# Patient Record
Sex: Female | Born: 1995
Health system: Southern US, Community
[De-identification: ages and names within clinical notes are randomized; demographics above are authoritative.]

## PROBLEM LIST (undated history)

## (undated) DIAGNOSIS — N189 Chronic kidney disease, unspecified: Secondary | ICD-10-CM

## (undated) DIAGNOSIS — R51 Headache: Secondary | ICD-10-CM

## (undated) DIAGNOSIS — T7840XA Allergy, unspecified, initial encounter: Secondary | ICD-10-CM

## (undated) DIAGNOSIS — E559 Vitamin D deficiency, unspecified: Secondary | ICD-10-CM

## (undated) DIAGNOSIS — E079 Disorder of thyroid, unspecified: Secondary | ICD-10-CM

## (undated) DIAGNOSIS — M7989 Other specified soft tissue disorders: Secondary | ICD-10-CM

## (undated) DIAGNOSIS — G4730 Sleep apnea, unspecified: Secondary | ICD-10-CM

## (undated) DIAGNOSIS — R011 Cardiac murmur, unspecified: Secondary | ICD-10-CM

## (undated) DIAGNOSIS — F419 Anxiety disorder, unspecified: Secondary | ICD-10-CM

## (undated) DIAGNOSIS — D649 Anemia, unspecified: Secondary | ICD-10-CM

## (undated) DIAGNOSIS — E039 Hypothyroidism, unspecified: Secondary | ICD-10-CM

## (undated) DIAGNOSIS — K219 Gastro-esophageal reflux disease without esophagitis: Secondary | ICD-10-CM

## (undated) DIAGNOSIS — N289 Disorder of kidney and ureter, unspecified: Secondary | ICD-10-CM

## (undated) DIAGNOSIS — R1310 Dysphagia, unspecified: Secondary | ICD-10-CM

## (undated) DIAGNOSIS — K760 Fatty (change of) liver, not elsewhere classified: Secondary | ICD-10-CM

## (undated) HISTORY — DX: Other specified soft tissue disorders: M79.89

## (undated) HISTORY — DX: Chronic kidney disease, unspecified: N18.9

## (undated) HISTORY — DX: Vitamin D deficiency, unspecified: E55.9

## (undated) HISTORY — DX: Gastro-esophageal reflux disease without esophagitis: K21.9

## (undated) HISTORY — PX: TOTAL THYROIDECTOMY: SHX2547

## (undated) HISTORY — DX: Anxiety disorder, unspecified: F41.9

## (undated) HISTORY — DX: Allergy, unspecified, initial encounter: T78.40XA

## (undated) HISTORY — DX: Disorder of kidney and ureter, unspecified: N28.9

## (undated) HISTORY — DX: Cardiac murmur, unspecified: R01.1

## (undated) HISTORY — DX: Headache: R51

## (undated) HISTORY — DX: Dysphagia, unspecified: R13.10

## (undated) HISTORY — DX: Disorder of thyroid, unspecified: E07.9

## (undated) HISTORY — DX: Fatty (change of) liver, not elsewhere classified: K76.0

---
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
## 2014-01-21 ENCOUNTER — Ambulatory Visit (INDEPENDENT_AMBULATORY_CARE_PROVIDER_SITE_OTHER): Payer: Medicaid Other | Admitting: Family

## 2014-01-21 ENCOUNTER — Encounter (INDEPENDENT_AMBULATORY_CARE_PROVIDER_SITE_OTHER): Payer: Self-pay

## 2014-01-21 ENCOUNTER — Encounter: Payer: Self-pay | Admitting: Family

## 2014-01-21 VITALS — BP 114/67 | HR 61 | Temp 97.7°F | Ht 67.0 in | Wt 264.4 lb

## 2014-01-21 DIAGNOSIS — Z713 Dietary counseling and surveillance: Secondary | ICD-10-CM | POA: Diagnosis not present

## 2014-01-21 DIAGNOSIS — E669 Obesity, unspecified: Secondary | ICD-10-CM | POA: Diagnosis not present

## 2014-01-21 DIAGNOSIS — K219 Gastro-esophageal reflux disease without esophagitis: Secondary | ICD-10-CM | POA: Diagnosis not present

## 2014-01-21 DIAGNOSIS — Z23 Encounter for immunization: Secondary | ICD-10-CM | POA: Diagnosis not present

## 2014-01-21 MED ORDER — OMEPRAZOLE 20 MG PO CPDR: 20.0000 mg | DELAYED_RELEASE_CAPSULE | Freq: Every day | ORAL | Status: DC

## 2014-01-21 MED ORDER — NALTREXONE-BUPROPION HCL ER 8-90 MG PO TB12: ORAL_TABLET | ORAL | Status: DC

## 2014-01-21 NOTE — Progress Notes (Signed)
Subjective:    Patient ID: Janet [name], female    DOB: [birth year], 19 y.o.   MRN: [id number]  Gastrophageal Reflux She complains of belching, chest pain, coughing, heartburn and nausea. She reports no choking or no sore throat. This is a new problem. The current episode started more than 1 year ago. The problem occurs frequently. The heartburn duration is more than one hour. The heartburn is located in the substernum. The heartburn is of severe intensity. The heartburn wakes her from sleep. The symptoms are aggravated by certain foods, ETOH, exertion and lying down. Risk factors include obesity. She has tried an antacid for the symptoms. The treatment provided mild relief. Past procedures do not include esophageal manometry or H. pylori antibody titer. Past invasive treatments do not include reflux surgery.      Review of Systems  Constitutional: Negative.   HENT: Negative.  Negative for sore throat.   Eyes: Negative.   Respiratory: Positive for cough. Negative for choking and shortness of breath.   Cardiovascular: Positive for chest pain. Negative for palpitations.  Gastrointestinal: Positive for heartburn and nausea.  Endocrine: Negative.   Genitourinary: Negative.   Musculoskeletal: Negative.   Neurological: Negative.  Negative for headaches.  Hematological: Negative.   Psychiatric/Behavioral: Negative.   All other systems reviewed and are negative.      Objective:   Physical Exam  Constitutional: She is oriented to person, place, and time. She appears well-developed and well-nourished. No distress.  HENT:  Head: Normocephalic and atraumatic.  Right Ear: External ear normal.  Left Ear: External ear normal.  Nose: Nose normal.  Mouth/Throat: Oropharynx is clear and moist.  Eyes: Pupils are equal, round, and reactive to light.  Neck: Normal range of motion. Neck supple. No thyromegaly present.  Cardiovascular: Normal rate, regular rhythm, normal heart sounds and intact  distal pulses.   No murmur heard. Pulmonary/Chest: Effort normal and breath sounds normal. No respiratory distress. She has no wheezes.  Abdominal: Soft. Bowel sounds are normal. She exhibits no distension. There is no tenderness.  Musculoskeletal: Normal range of motion. She exhibits no edema or tenderness.  Neurological: She is alert and oriented to person, place, and time. She has normal reflexes. No cranial nerve deficit.  Skin: Skin is warm and dry.  Psychiatric: She has a normal mood and affect. Her behavior is normal. Judgment and thought content normal.  Vitals reviewed.   BP 114/67 mmHg  Pulse 61  Temp(Src) 97.7 F (36.5 C) (Oral)  Ht 5' 7" (1.702 m)  Wt 264 lb 6.4 oz (119.931 kg)  BMI 41.40 kg/m2       Assessment & Plan:  1. Gastroesophageal reflux disease, esophagitis presence not specified - omeprazole (PRILOSEC) 20 MG capsule; Take 1 capsule (20 mg total) by mouth daily.  Dispense: 90 capsule; Refill: 3 - CMP14+EGFR  2. Obesity - Naltrexone-Bupropion HCl ER 8-90 MG TB12; 1 tab PO q AM for one week, then 1 tab PO BID for 1 week, then 2 tabs PO q AM and 1 PO aPM for 1 week. Max 4 tabs/day  Dispense: 120 tablet; Refill: 0 - CMP14+EGFR  3. Weight loss counseling, encounter for -Will see pt in one month for weight loss -Medication d/c in 12 weeks if <5% weight loss -Discussed healthy diet and exercise - Naltrexone-Bupropion HCl ER 8-90 MG TB12; 1 tab PO q AM for one week, then 1 tab PO BID for 1 week, then 2 tabs PO q AM and 1 PO aPM for  1 week. Max 4 tabs/day  Dispense: 120 tablet; Refill: 0 - CMP14+EGFR   Continue all meds Labs pending Health Maintenance reviewed-Flu vaccine given today Diet and exercise encouraged RTO 1  Month for weight loss counseling   Evelina Dun, FNP

## 2014-01-21 NOTE — Patient Instructions (Signed)
\Exercise to Lose Weight Exercise and a healthy diet may help you lose weight. Your doctor may suggest specific exercises. EXERCISE IDEAS AND TIPS  Choose low-cost things you enjoy doing, such as walking, bicycling, or exercising to workout videos.  Take stairs instead of the elevator.  Walk during your lunch break.  Park your car further away from work or school.  Go to a gym or an exercise class.  Start with 5 to 10 minutes of exercise each day. Build up to 30 minutes of exercise 4 to 6 days a week.  Wear shoes with good support and comfortable clothes.  Stretch before and after working out.  Work out until you breathe harder and your heart beats faster.  Drink extra water when you exercise.  Do not do so much that you hurt yourself, feel dizzy, or get very short of breath. Exercises that burn about 150 calories:  Running 1  miles in 15 minutes.  Playing volleyball for 45 to 60 minutes.  Washing and waxing a car for 45 to 60 minutes.  Playing touch football for 45 minutes.  Walking 1  miles in 35 minutes.  Pushing a stroller 1  miles in 30 minutes.  Playing basketball for 30 minutes.  Raking leaves for 30 minutes.  Bicycling 5 miles in 30 minutes.  Walking 2 miles in 30 minutes.  Dancing for 30 minutes.  Shoveling snow for 15 minutes.  Swimming laps for 20 minutes.  Walking up stairs for 15 minutes.  Bicycling 4 miles in 15 minutes.  Gardening for 30 to 45 minutes.  Jumping rope for 15 minutes.  Washing windows or floors for 45 to 60 minutes. Document Released: 01/26/2010 Document Revised: 03/18/2011 Document Reviewed: 01/26/2010 [facility] Patient Information 2015 [place], [place]. This information is not intended to replace advice given to you by your health care provider. Make sure you discuss any questions you have with your health care provider. Gastroesophageal Reflux Disease, Adult Gastroesophageal reflux disease (GERD) happens when acid from  your stomach flows up into the esophagus. When acid comes in contact with the esophagus, the acid causes soreness (inflammation) in the esophagus. Over time, GERD may create small holes (ulcers) in the lining of the esophagus. CAUSES   Increased body weight. This puts pressure on the stomach, making acid rise from the stomach into the esophagus.  Smoking. This increases acid production in the stomach.  Drinking alcohol. This causes decreased pressure in the lower esophageal sphincter (valve or ring of muscle between the esophagus and stomach), allowing acid from the stomach into the esophagus.  Late evening meals and a full stomach. This increases pressure and acid production in the stomach.  A malformed lower esophageal sphincter. Sometimes, no cause is found. SYMPTOMS   Burning pain in the lower part of the mid-chest behind the breastbone and in the mid-stomach area. This may occur twice a week or more often.  Trouble swallowing.  Sore throat.  Dry cough.  Asthma-like symptoms including chest tightness, shortness of breath, or wheezing. DIAGNOSIS  Your caregiver may be able to diagnose GERD based on your symptoms. In some cases, X-rays and other tests may be done to check for complications or to check the condition of your stomach and esophagus. TREATMENT  Your caregiver may recommend over-the-counter or prescription medicines to help decrease acid production. Ask your caregiver before starting or adding any new medicines.  HOME CARE INSTRUCTIONS   Change the factors that you can control. Ask your caregiver for guidance concerning  weight loss, quitting smoking, and alcohol consumption.  Avoid foods and drinks that make your symptoms worse, such as:  Caffeine or alcoholic drinks.  Chocolate.  Peppermint or mint flavorings.  Garlic and onions.  Spicy foods.  Citrus fruits, such as oranges, lemons, or limes.  Tomato-based foods such as sauce, chili, salsa, and  pizza.  Fried and fatty foods.  Avoid lying down for the 3 hours prior to your bedtime or prior to taking a nap.  Eat small, frequent meals instead of large meals.  Wear loose-fitting clothing. Do not wear anything tight around your waist that causes pressure on your stomach.  Raise the head of your bed 6 to 8 inches with wood blocks to help you sleep. Extra pillows will not help.  Only take over-the-counter or prescription medicines for pain, discomfort, or fever as directed by your caregiver.  Do not take aspirin, ibuprofen, or other nonsteroidal anti-inflammatory drugs (NSAIDs). SEEK IMMEDIATE MEDICAL CARE IF:   You have pain in your arms, neck, jaw, teeth, or back.  Your pain increases or changes in intensity or duration.  You develop nausea, vomiting, or sweating (diaphoresis).  You develop shortness of breath, or you faint.  Your vomit is green, yellow, black, or looks like coffee grounds or blood.  Your stool is red, bloody, or black. These symptoms could be signs of other problems, such as heart disease, gastric bleeding, or esophageal bleeding. MAKE SURE YOU:   Understand these instructions.  Will watch your condition.  Will get help right away if you are not doing well or get worse. Document Released: 10/03/2004 Document Revised: 03/18/2011 Document Reviewed: 07/13/2010 [facility] Patient Information 2015 [place]. This information is not intended to replace advice given to you by your health care provider. Make sure you discuss any questions you have with your health care provider.

## 2014-01-22 LAB — CMP14+EGFR
ALT: 22 IU/L (ref 0–32)
AST: 14 IU/L (ref 0–40)
Albumin/Globulin Ratio: 1.4 (ref 1.1–2.5)
Albumin: 4.3 g/dL (ref 3.5–5.5)
Alkaline Phosphatase: 87 IU/L (ref 39–117)
BUN/Creatinine Ratio: 17 (ref 8–20)
BUN: 13 mg/dL (ref 6–20)
CO2: 22 mmol/L (ref 18–29)
Calcium: 9.2 mg/dL (ref 8.7–10.2)
Chloride: 102 mmol/L (ref 97–108)
Creatinine, Ser: 0.77 mg/dL (ref 0.57–1.00)
GFR calc Af Amer: 129 mL/min/{1.73_m2} (ref >59)
GFR calc non Af Amer: 112 mL/min/{1.73_m2} (ref >59)
Globulin, Total: 3 g/dL (ref 1.5–4.5)
Glucose: 79 mg/dL (ref 65–99)
Potassium: 4.6 mmol/L (ref 3.5–5.2)
Sodium: 141 mmol/L (ref 134–144)
Total Bilirubin: 0.5 mg/dL (ref 0.0–1.2)
Total Protein: 7.3 g/dL (ref 6.0–8.5)

## 2014-02-21 ENCOUNTER — Ambulatory Visit: Payer: Medicaid Other | Admitting: Family

## 2014-05-09 ENCOUNTER — Other Ambulatory Visit (INDEPENDENT_AMBULATORY_CARE_PROVIDER_SITE_OTHER): Payer: Medicaid Other

## 2014-05-09 DIAGNOSIS — E063 Autoimmune thyroiditis: Secondary | ICD-10-CM

## 2014-05-09 LAB — T4, FREE: Free T4: 0.68 ng/dL (ref 0.60–1.60)

## 2014-05-09 LAB — TSH: TSH: 6.99 u[IU]/mL — ABNORMAL HIGH (ref 0.40–5.00)

## 2014-05-11 ENCOUNTER — Other Ambulatory Visit: Payer: Self-pay | Admitting: *Deleted

## 2014-05-11 DIAGNOSIS — E063 Autoimmune thyroiditis: Secondary | ICD-10-CM

## 2014-06-27 ENCOUNTER — Other Ambulatory Visit: Payer: Medicaid Other

## 2014-06-27 ENCOUNTER — Other Ambulatory Visit (INDEPENDENT_AMBULATORY_CARE_PROVIDER_SITE_OTHER): Payer: Medicaid Other

## 2014-06-27 DIAGNOSIS — E063 Autoimmune thyroiditis: Secondary | ICD-10-CM

## 2014-06-27 LAB — TSH: TSH: 3.52 u[IU]/mL (ref 0.35–5.50)

## 2014-06-27 LAB — T4, FREE: Free T4: 0.68 ng/dL (ref 0.60–1.60)

## 2014-09-08 ENCOUNTER — Other Ambulatory Visit: Payer: Self-pay | Admitting: Pediatrics

## 2014-11-25 ENCOUNTER — Ambulatory Visit (INDEPENDENT_AMBULATORY_CARE_PROVIDER_SITE_OTHER): Payer: Medicaid Other | Admitting: Internal Medicine

## 2014-11-25 ENCOUNTER — Encounter: Payer: Self-pay | Admitting: Internal Medicine

## 2014-11-25 VITALS — BP 122/70 | HR 68 | Temp 97.9°F | Ht 67.0 in | Wt 258.0 lb

## 2014-11-25 DIAGNOSIS — E063 Autoimmune thyroiditis: Secondary | ICD-10-CM

## 2014-11-25 DIAGNOSIS — Z23 Encounter for immunization: Secondary | ICD-10-CM | POA: Diagnosis not present

## 2014-11-25 LAB — T4, FREE: Free T4: 0.64 ng/dL (ref 0.60–1.60)

## 2014-11-25 LAB — TSH: TSH: 3.92 u[IU]/mL (ref 0.35–5.50)

## 2014-11-25 MED ORDER — LEVOTHYROXINE SODIUM 75 MCG PO TABS: 75.0000 ug | ORAL_TABLET | Freq: Every day | ORAL | Status: DC

## 2014-11-25 NOTE — Patient Instructions (Signed)
Please stop at the lab.  Please have thyroid labs checked in 6 months.  Please return in 1 year.

## 2014-11-25 NOTE — Progress Notes (Signed)
Patient ID: Janet [name], female   DOB: [birth year], 19 y.o.   MRN: [id number]   HPI  Janet [name] is a 19 y.o.-year-old female, initially referred by her PCP, Dr. Christell Constant, returning for f/u for hypothyroidism 2/2 Hashimoto thyroiditis and goiter. Last visit 1 year ago.  We started Levothyroxine 25 mcg >> dose increased to 50 mcg daily in 05/2014.  She takes the LT4: - am - fasting - with water or ginger ale - + PPI  - no Fe, Ca, MVI  I reviewed pt's thyroid tests: Lab Results  Component Value Date   TSH 3.52 06/27/2014   TSH 6.99* 05/09/2014   TSH 3.964 11/25/2013   TSH 2.47 04/26/2013   TSH 5.19 02/26/2013   TSH 6.850* 01/15/2013   FREET4 0.68 06/27/2014   FREET4 0.68 05/09/2014   FREET4 0.78* 11/25/2013   FREET4 0.59* 04/26/2013   FREET4 0.57* 02/26/2013   Component     Latest Ref Rng 02/26/2013  Thyroid Peroxidase Antibody     <35.0 IU/mL 5991.0 (H)   Pt denies feeling nodules in neck, no hoarseness , improved dysphagia/no odynophagia in upper chest, no SOB with lying down.  Pt describes: - + hair loss - + heat and cold intolerance - + weight loss + gain - resolved fatigue  - improved constipation - improved dry skin all  - no anxiety/no depression  She also has a history of migraines.  ROS: Constitutional: no weight gain/loss, no fatigue, no subjective hyperthermia/hypothermia, + poor sleep Eyes: no blurry vision, no xerophthalmia ENT: no sore throat, no nodules palpated in throat, + dysphagia/no odynophagia, no  hoarseness Cardiovascular: no CP/SOB/no palpitations/no leg swelling Respiratory: no cough/SOB Gastrointestinal: no N/V/D/C, + acid reflux Musculoskeletal: no muscle pains/no joint aches Skin: no rashes, no easy bruising, + hair loss Neurological: no tremors/numbness/tingling/dizziness, + HA  I reviewed pt's medications, allergies, PMH, social hx, family hx, and changes were documented in the history of present illness. Otherwise, unchanged from my  initial visit note  PE: BP 122/70 mmHg  Pulse 68  Temp(Src) 97.9 F (36.6 C) (Oral)  Ht  (1.702 m)  Wt 258 lb (117.028 kg)  BMI 40.40 kg/m2 Wt Readings from Last 3 Encounters:  11/25/14 258 lb (117.028 kg)  01/21/14 264 lb 6.4 oz (119.931 kg) (100 %*, Z = 2.62)  11/25/13 258 lb (117.028 kg) (99 %*, Z = 2.56)   * Growth percentiles are based on CDC 2-20 Years data.   Constitutional: obese, in NAD Eyes: PERRLA, EOMI, no exophthalmos ENT: moist mucous membranes, + thyromegaly R>L, no cervical lymphadenopathy Cardiovascular: RRR, No MRG Respiratory: CTA B Gastrointestinal: abdomen soft, NT, ND, BS+ Musculoskeletal: no deformities, strength intact in all 4 Skin: moist, warm, no rashes Neurological: mild tremor with outstretched hands, DTR normal in all 4  ASSESSMENT: 1. Low TSH  2. goiter - thyroid U/S (01/31/2013): no nodules; enlarged thyroid  Right thyroid lobe Measurements: 6.9 x 1.6 x 2.7 cm. Heterogeneous parenchyma, without discrete thyroid nodule. Left thyroid lobe Measurements: 5.6 x 1.8 x 2.4 cm. Heterogeneous parenchyma, without discrete thyroid nodule. Isthmus Thickness: 8 mm in thickness. No nodules visualized. Lymphadenopathy None visualized.  PLAN:  1. Patient with Hashimoto's thyroiditis - on Levothyroxine 50 mcg daily. She takes it correctly. - will check thyroid tests today: TSH, free T4 - If these are abnormal, she will need to return in 6-8 weeks for repeat labs - If these are normal, will repeat them in 6 months - RTC in 1  year  2. Goiter - pt with neck compression sxs, but likely 2/2 GERD - reviewed together thyroid U/S 01/2013: goiter, no nodules  Given flu shot today.  Office Visit on 11/25/2014  Component Date Value Ref Range Status  . Free T4 11/25/2014 0.64  0.60 - 1.60 ng/dL Final  . TSH 40/98/119111/18/2016 3.92  0.35 - 5.50 uIU/mL Final   TSH normal, but high in the normal range >> will try to increase the dose to 75 mcg to see if her sxs  improve. Repeat labs in 5-6 weeks.

## 2014-12-07 ENCOUNTER — Encounter: Payer: Self-pay | Admitting: Pediatrics

## 2014-12-07 ENCOUNTER — Ambulatory Visit (INDEPENDENT_AMBULATORY_CARE_PROVIDER_SITE_OTHER): Payer: Medicaid Other | Admitting: Pediatrics

## 2014-12-07 VITALS — BP 115/68 | HR 64 | Temp 97.3°F | Ht 67.0 in | Wt 257.6 lb

## 2014-12-07 DIAGNOSIS — J019 Acute sinusitis, unspecified: Secondary | ICD-10-CM

## 2014-12-07 DIAGNOSIS — Z309 Encounter for contraceptive management, unspecified: Secondary | ICD-10-CM

## 2014-12-07 DIAGNOSIS — Z720 Tobacco use: Secondary | ICD-10-CM

## 2014-12-07 MED ORDER — AMOXICILLIN-POT CLAVULANATE 875-125 MG PO TABS: 1.0000 | ORAL_TABLET | Freq: Two times a day (BID) | ORAL | Status: DC

## 2014-12-07 NOTE — Patient Instructions (Signed)
Debrox drops for ear wax  Neti pot with distilled water and salt packet twice a day

## 2014-12-07 NOTE — Progress Notes (Signed)
Subjective:    Patient ID: Janet [name], female    DOB: 02/13/1995, 19 y.o.   MRN: [id number]  CC: Cough and Nasal Congestion   HPI: Janet [name] is a 19 y.o. female presenting for Cough and Nasal Congestion  Congestion started a week ago Coughing some Not keeping her awake No fevers Body aches at the beginnning Everybody has been sick around her, works at [employer]  Mood has been fine Thinks she can quit   Depression screen [facility] 2/9 12/07/2014 01/21/2014  Decreased Interest 0 0  Down, Depressed, Hopeless 0 0  PHQ - 2 Score 0 0     Relevant past medical, surgical, family and social history reviewed and updated as indicated. Interim medical history since our last visit reviewed. Allergies and medications reviewed and updated.    ROS: Per HPI unless specifically indicated above  Past Medical History Patient Active Problem List   Diagnosis Date Noted  . Tobacco abuse 12/07/2014  . GERD (gastroesophageal reflux disease) 01/21/2014  . Hashimoto's thyroiditis 01/26/2013  . Migraine without aura, without mention of intractable migraine without mention of status migrainosus 01/15/2013  . Episodic tension type headache 01/15/2013  . Obesity 01/15/2013  . Insomnia, unspecified 01/15/2013  . Dysfunctions associated with sleep stages or arousal from sleep 01/15/2013    Current Outpatient Prescriptions  Medication Sig Dispense Refill  . cloNIDine (CATAPRES) 0.1 MG tablet TAKE 1 TABLET (0.1 MG TOTAL) BY MOUTH AT BEDTIME. 30 tablet 0  . levothyroxine (SYNTHROID, LEVOTHROID) 75 MCG tablet Take 1 tablet (75 mcg total) by mouth daily. 90 tablet 3  . omeprazole (PRILOSEC) 20 MG capsule Take 1 capsule (20 mg total) by mouth daily. 90 capsule 3  . ondansetron (ZOFRAN-ODT) 4 MG disintegrating tablet Take one tablet by mouth as needed for nausea from headaches 20 tablet 5  . topiramate (TOPAMAX) 25 MG tablet TAKE 2 TABLETS BY MOUTH AT BEDTIME. 60 tablet 0  . amoxicillin-clavulanate  (AUGMENTIN) 875-125 MG tablet Take 1 tablet by mouth 2 (two) times daily. 20 tablet 0  . Naltrexone-Bupropion HCl ER 8-90 MG TB12 1 tab PO q AM for one week, then 1 tab PO BID for 1 week, then 2 tabs PO q AM and 1 PO aPM for 1 week. Max 4 tabs/day 120 tablet 0   No current facility-administered medications for this visit.       Objective:    BP 115/68 mmHg  Pulse 64  Temp(Src) 97.3 F (36.3 C) (Oral)  Ht 5\' 7"  (1.702 m)  Wt 257 lb 9.6 oz (116.847 kg)  BMI 40.34 kg/m2  Wt Readings from Last 3 Encounters:  12/07/14 257 lb 9.6 oz (116.847 kg)  11/25/14 258 lb (117.028 kg)  01/21/14 264 lb 6.4 oz (119.931 kg) (100 %*, Z = 2.62)   * Growth percentiles are based on CDC 2-20 Years data.     Gen: NAD, alert, cooperative with exam, NCAT EYES: EOMI, no scleral injection or icterus ENT:  TMs pearly gray b/l, OP with erythema, tenderness over max sinuses b/l LYMPH: no cervical LAD CV: NRRR, normal S1/S2, no murmur, distal pulses 2+ b/l Resp: CTABL, no wheezes, normal WOB Abd: +BS, soft, NTND. no guarding or organomegaly Ext: No edema, warm Neuro: Alert and oriented, strength equal b/l UE and LE, coordination grossly normal MSK: normal muscle bulk     Assessment & Plan:    Janet [name] was seen today for cough and nasal congestion ongoing over a week.  Diagnoses and all  orders for this visit:  Acute sinusitis, recurrence not specified, unspecified location -     amoxicillin-clavulanate (AUGMENTIN) 875-125 MG tablet; Take 1 tablet by mouth 2 (two) times daily.  Tobacco abuse More than 3 minutes spent discussing health risks of continued tobacco use and ways to decrease use.  Encounter for contraceptive management, unspecified encounter Pt interested in IUD vs nexplanon placement.  -     Ambulatory referral to Gynecology  Follow up plan: Return if symptoms worsen or fail to improve.  Rex Kras, MD [facility] 12/07/2014, 11:54 AM

## 2014-12-22 ENCOUNTER — Encounter: Payer: Medicaid Other | Admitting: Women's Health

## 2014-12-28 ENCOUNTER — Encounter: Payer: Self-pay | Admitting: Women's Health

## 2014-12-28 ENCOUNTER — Ambulatory Visit (INDEPENDENT_AMBULATORY_CARE_PROVIDER_SITE_OTHER): Payer: Medicaid Other | Admitting: Women's Health

## 2014-12-28 VITALS — BP 110/60 | HR 76 | Ht 66.5 in | Wt 260.5 lb

## 2014-12-28 DIAGNOSIS — N946 Dysmenorrhea, unspecified: Secondary | ICD-10-CM | POA: Diagnosis not present

## 2014-12-28 MED ORDER — MEFENAMIC ACID 250 MG PO CAPS: ORAL_CAPSULE | ORAL | Status: DC

## 2014-12-28 NOTE — Patient Instructions (Signed)
To follow-up with headache doctor to see if you have migraines with aura

## 2014-12-28 NOTE — Progress Notes (Signed)
Patient ID: Candiss NorseMegan V Hucker, female   DOB: 12/19/1995, 19 y.o.   MRN: [id number]   [facility]  Patient name: Candiss NorseMegan V Polhemus MRN [id number]  Date of birth: 02/04/1995  CC & HPI:  Candiss NorseMegan V Dolloff is a 19 y.o. G0 Caucasian female presenting today for report of painful cramps w/ periods. Periods are regular, last 3-5 days, normal flow, bad cramps for first 2-3 days that keep her up at night. Has tried ibuprofen and midol which only last 2-3 hours then cramps return even worse. States she has been told by PCP she is not a candidate for [facility], although she is unsure of why. Does have migraines and sees a neurologist q 6 months and should be due for an appt soon. She is taking clonidine and topamax which have controlled her migraines well. She has never been told that she has aura. When asked she states she is able to tell when she is going to get a migraine before it comes and occ sees spots during migraine- but only when looking at lights. Denies paresthesias, vision/hearing loss, etc. Per Dr. Darl HouseholderHickling's (neuro) note 04/08/13 her migraines are w/o aura. Denies h/o HTN, DVT/PE, CVA, MI. Does smoke. She is not sexually active. Initially requested nexplanon- discussed that this will not help w/ her cramping and may make her periods irregular.  Since she does not require contraception at this time and periods are regular and normal flow, discussed rx'ing different kind of nsaid to try, pt agrees w/ this.  Patient's last menstrual period was 12/05/2014. The current method of family planning is abstinence. Last pap <20yo  Pertinent History Reviewed:  Medical & Surgical Hx:   Past Medical History  Diagnosis Date  . Headache(784.0)   . GERD (gastroesophageal reflux disease)   . Thyroid disease    Past Surgical History  Procedure Laterality Date  . Tonsillectomy and adenoidectomy Bilateral 01/2011   Medications: Reviewed & Updated - see associated section Social History: Reviewed -  reports that she  has been smoking Cigarettes.  She has smoked for the past 1 year. She has never used smokeless tobacco.  Objective Findings:  Vitals: BP 110/60 mmHg  Pulse 76  Ht 5' 6.5" (1.689 m)  Wt 260 lb 8 oz (118.162 kg)  BMI 41.42 kg/m2  LMP 12/05/2014 Body mass index is 41.42 kg/(m^2).  Physical Examination: General appearance - alert, well appearing, and in no distress  No results found for this or any previous visit (from the past 24 hour(s)).   Assessment & Plan:  A:   Dysmenorrhea  P:  Rx mefenamic acid 500mg  at onset of period/cramps, then 250mg  q 6hr prn thereafter for total of 2-3d   Return for after 6/8 for pap smear.  Marge DuncansBooker, Camillo Quadros Randall CNM, [facility]NP-BC 12/28/2014 1:07 PM

## 2014-12-29 ENCOUNTER — Encounter: Payer: Self-pay | Admitting: *Deleted

## 2014-12-29 NOTE — Progress Notes (Signed)
Patient ID: Janet [name], female   DOB: 02/23/1995, 19 y.o.   MRN: [id number] Prior Auth for Mefenamic Acid 250 mg filed, will need to be reviewed by pharmacist.   PA # [id number]

## 2015-02-13 ENCOUNTER — Emergency Department (HOSPITAL_COMMUNITY)
Admission: EM | Admit: 2015-02-13 | Discharge: 2015-02-14 | Disposition: A | Discharge status: Home / Self Care | Payer: Medicaid Other | Attending: Emergency Medicine | Admitting: Emergency Medicine

## 2015-02-13 ENCOUNTER — Encounter (HOSPITAL_COMMUNITY): Payer: Self-pay | Admitting: Emergency Medicine

## 2015-02-13 DIAGNOSIS — Z79899 Other long term (current) drug therapy: Secondary | ICD-10-CM | POA: Diagnosis not present

## 2015-02-13 DIAGNOSIS — E079 Disorder of thyroid, unspecified: Secondary | ICD-10-CM | POA: Diagnosis not present

## 2015-02-13 DIAGNOSIS — R112 Nausea with vomiting, unspecified: Secondary | ICD-10-CM | POA: Insufficient documentation

## 2015-02-13 DIAGNOSIS — K219 Gastro-esophageal reflux disease without esophagitis: Secondary | ICD-10-CM | POA: Insufficient documentation

## 2015-02-13 DIAGNOSIS — R197 Diarrhea, unspecified: Secondary | ICD-10-CM | POA: Diagnosis not present

## 2015-02-13 DIAGNOSIS — F17210 Nicotine dependence, cigarettes, uncomplicated: Secondary | ICD-10-CM | POA: Insufficient documentation

## 2015-02-13 DIAGNOSIS — Z3202 Encounter for pregnancy test, result negative: Secondary | ICD-10-CM | POA: Diagnosis not present

## 2015-02-13 DIAGNOSIS — R1013 Epigastric pain: Secondary | ICD-10-CM | POA: Insufficient documentation

## 2015-02-13 DIAGNOSIS — R1010 Upper abdominal pain, unspecified: Secondary | ICD-10-CM | POA: Diagnosis present

## 2015-02-13 LAB — COMPREHENSIVE METABOLIC PANEL
ALT: 20 U/L (ref 14–54)
AST: 20 U/L (ref 15–41)
Albumin: 4 g/dL (ref 3.5–5.0)
Alkaline Phosphatase: 85 U/L (ref 38–126)
Anion gap: 8 (ref 5–15)
BUN: 14 mg/dL (ref 6–20)
CO2: 25 mmol/L (ref 22–32)
Calcium: 8.8 mg/dL — ABNORMAL LOW (ref 8.9–10.3)
Chloride: 106 mmol/L (ref 101–111)
Creatinine, Ser: 0.74 mg/dL (ref 0.44–1.00)
GFR calc Af Amer: >60 mL/min (ref >60)
GFR calc non Af Amer: >60 mL/min (ref >60)
Glucose, Bld: 92 mg/dL (ref 65–99)
Potassium: 3.4 mmol/L — ABNORMAL LOW (ref 3.5–5.1)
Sodium: 139 mmol/L (ref 135–145)
Total Bilirubin: 0.3 mg/dL (ref 0.3–1.2)
Total Protein: 7.8 g/dL (ref 6.5–8.1)

## 2015-02-13 LAB — URINALYSIS, ROUTINE W REFLEX MICROSCOPIC
Bilirubin Urine: NEGATIVE
Glucose, UA: NEGATIVE mg/dL
Hgb urine dipstick: NEGATIVE
Ketones, ur: NEGATIVE mg/dL
Leukocytes, UA: NEGATIVE
Nitrite: NEGATIVE
Protein, ur: NEGATIVE mg/dL
Specific Gravity, Urine: >1.03 — ABNORMAL HIGH (ref 1.005–1.030)
pH: 5 (ref 5.0–8.0)

## 2015-02-13 LAB — CBC WITH DIFFERENTIAL/PLATELET
Basophils Absolute: 0 10*3/uL (ref 0.0–0.1)
Basophils Relative: 0 %
Eosinophils Absolute: 0.1 10*3/uL (ref 0.0–0.7)
Eosinophils Relative: 1 %
HCT: 38.6 % (ref 36.0–46.0)
Hemoglobin: 13.3 g/dL (ref 12.0–15.0)
Lymphocytes Relative: 32 %
Lymphs Abs: 3 10*3/uL (ref 0.7–4.0)
MCH: 30.2 pg (ref 26.0–34.0)
MCHC: 34.5 g/dL (ref 30.0–36.0)
MCV: 87.7 fL (ref 78.0–100.0)
Monocytes Absolute: 0.6 10*3/uL (ref 0.1–1.0)
Monocytes Relative: 7 %
Neutro Abs: 5.8 10*3/uL (ref 1.7–7.7)
Neutrophils Relative %: 60 %
Platelets: 284 10*3/uL (ref 150–400)
RBC: 4.4 MIL/uL (ref 3.87–5.11)
RDW: 12.4 % (ref 11.5–15.5)
WBC: 9.6 10*3/uL (ref 4.0–10.5)

## 2015-02-13 LAB — POC URINE PREG, ED: Preg Test, Ur: NEGATIVE

## 2015-02-13 LAB — LIPASE, BLOOD: Lipase: 26 U/L (ref 11–51)

## 2015-02-13 MED ORDER — PANTOPRAZOLE SODIUM 40 MG IV SOLR
40.0000 mg | Freq: Once | INTRAVENOUS | Status: AC
  Administered 2015-02-13: 40 mg via INTRAVENOUS
  Filled 2015-02-13: qty 40

## 2015-02-13 NOTE — ED Notes (Signed)
Pt c/o abd pain with diarrhea and states she has been vomiting but not today.

## 2015-02-13 NOTE — ED Notes (Signed)
Pt states upper abdominal pain that started 5 days ago. Had some nausea & vomiting then, none current. States she has had some loose stools since then, 1 today.

## 2015-02-14 ENCOUNTER — Ambulatory Visit (HOSPITAL_COMMUNITY)
Admission: RE | Admit: 2015-02-14 | Discharge: 2015-02-14 | Disposition: A | Discharge status: Home / Self Care | Payer: Medicaid Other | Source: Ambulatory Visit | Attending: Emergency Medicine | Admitting: Emergency Medicine

## 2015-02-14 DIAGNOSIS — R197 Diarrhea, unspecified: Secondary | ICD-10-CM | POA: Diagnosis not present

## 2015-02-14 DIAGNOSIS — R110 Nausea: Secondary | ICD-10-CM | POA: Insufficient documentation

## 2015-02-14 DIAGNOSIS — R1011 Right upper quadrant pain: Secondary | ICD-10-CM | POA: Diagnosis present

## 2015-02-14 MED ORDER — OMEPRAZOLE 20 MG PO CPDR: 20.0000 mg | DELAYED_RELEASE_CAPSULE | Freq: Every day | ORAL | Status: DC

## 2015-02-14 NOTE — Discharge Instructions (Signed)

## 2015-02-16 NOTE — ED Provider Notes (Signed)
CSN: [id number]     Arrival date & time 02/13/15  1913 History   First MD Initiated Contact with Patient 02/13/15 2213     Chief Complaint  Patient presents with  . Abdominal Pain     (Consider location/radiation/quality/duration/timing/severity/associated sxs/prior Treatment) HPI   Janet [name] is a 20 y.o. female who presents to the Emergency Department complaining of upper abdominal pain for 5 days.  Pain has been waxing and waning.  She reports associated nausea and vomiting, usually with eating.  Also notes having some loose stools.  She describes the pain as aching.  She denies any radiation of pain, fever, chest pain, bloody stools or vomitus. No recent abdominal surgeries.    Past Medical History  Diagnosis Date  . Headache(784.0)   . GERD (gastroesophageal reflux disease)   . Thyroid disease    Past Surgical History  Procedure Laterality Date  . Tonsillectomy and adenoidectomy Bilateral 01/2011   Family History  Problem Relation Age of Onset  . Heart attack Maternal Grandmother   . Heart Problems Maternal Grandfather   . Heart attack Maternal Grandfather   . Cancer Paternal Grandmother   . Cancer Paternal Grandfather   . Diabetes Mother   . Other Mother     gastropresis  . Other Father     syncope-has a pacemaker  . COPD Father    Social History  Substance Use Topics  . Smoking status: Current Some Day Smoker -- 1 years    Types: Cigarettes  . Smokeless tobacco: Never Used  . Alcohol Use: No   OB History    No data available     Review of Systems  Constitutional: Negative for fever, chills and appetite change.  Respiratory: Negative for shortness of breath.   Cardiovascular: Negative for chest pain.  Gastrointestinal: Positive for nausea, vomiting, abdominal pain and diarrhea. Negative for blood in stool, abdominal distention and anal bleeding.  Genitourinary: Negative for dysuria, flank pain, decreased urine volume and difficulty urinating.   Musculoskeletal: Negative for back pain.  Skin: Negative for color change and rash.  Neurological: Negative for dizziness, weakness and numbness.  Hematological: Negative for adenopathy.  All other systems reviewed and are negative.     Allergies  Review of patient's allergies indicates no known allergies.  Home Medications   Prior to Admission medications   Medication Sig Start Date End Date Taking? Authorizing Provider  cloNIDine (CATAPRES) 0.1 MG tablet TAKE 1 TABLET (0.1 MG TOTAL) BY MOUTH AT BEDTIME. 09/09/14  Yes Elveria Rising, NP  ibuprofen (ADVIL,MOTRIN) 200 MG tablet Take 200 mg by mouth every 6 (six) hours as needed.   Yes Historical Provider, MD  levothyroxine (SYNTHROID, LEVOTHROID) 75 MCG tablet Take 1 tablet (75 mcg total) by mouth daily. 11/25/14  Yes Carlus Pavlov, MD  Mefenamic Acid 250 MG CAPS  (2 caps) at the onset of bleeding/cramping, followed by  (1 cap) every 6 hours as needed for no more than 2-3 days 12/28/14  Yes Cheral Marker, CNM  topiramate (TOPAMAX) 25 MG tablet TAKE 2 TABLETS BY MOUTH AT BEDTIME. Patient taking differently: TAKE 2 TABLETS BY MOUTH AT BEDTIME AS NEEDED FOR MIGRAINES 09/09/14  Yes Elveria Rising, NP  amoxicillin-clavulanate (AUGMENTIN) 875-125 MG tablet Take 1 tablet by mouth 2 (two) times daily. Patient not taking: Reported on 02/13/2015 12/07/14   Johna Sheriff, MD  omeprazole (PRILOSEC) 20 MG capsule Take 1 capsule (20 mg total) by mouth daily. 02/14/15   Kemoni Ortega, PA-C  ondansetron (  ZOFRAN-ODT) 4 MG disintegrating tablet Take one tablet by mouth as needed for nausea from headaches Patient not taking: Reported on 02/13/2015 04/08/13   Deetta Perla, MD   BP 107/56 mmHg  Pulse 69  Temp(Src) 97.4 F (36.3 C) (Temporal)  Resp 16  Ht  (1.676 m)  Wt 116.121 kg  BMI 41.34 kg/m2  SpO2 99%  LMP 02/05/2015 Physical Exam  Constitutional: She is oriented to person, place, and time. She appears well-developed  and well-nourished. No distress.  HENT:  Head: Normocephalic and atraumatic.  Mouth/Throat: Oropharynx is clear and moist.  Cardiovascular: Normal rate, regular rhythm, normal heart sounds and intact distal pulses.   No murmur heard. Pulmonary/Chest: Effort normal and breath sounds normal. No respiratory distress.  Abdominal: Soft. Normal appearance and bowel sounds are normal. She exhibits no distension and no mass. There is tenderness in the epigastric area. There is no rebound, no guarding and no CVA tenderness.  Musculoskeletal: Normal range of motion. She exhibits no edema.  Neurological: She is alert and oriented to person, place, and time. She exhibits normal muscle tone. Coordination normal.  Skin: Skin is warm and dry.  Nursing note and vitals reviewed.   ED Course  Procedures (including critical care time) Labs Review Labs Reviewed  COMPREHENSIVE METABOLIC PANEL - Abnormal; Notable for the following:    Potassium 3.4 (*)    Calcium 8.8 (*)    All other components within normal limits  URINALYSIS, ROUTINE W REFLEX MICROSCOPIC (NOT AT [facility]) - Abnormal; Notable for the following:    Specific Gravity, Urine >1.030 (*)    All other components within normal limits  LIPASE, BLOOD  CBC WITH DIFFERENTIAL/PLATELET  POC URINE PREG, ED    Imaging Review No results found. I have personally reviewed and evaluated these images and lab results as part of my medical decision-making.   EKG Interpretation None      MDM   Final diagnoses:  Epigastric pain    Pt well appearing, non-toxic.  Mild epigastric tenderness w/o concerning sx's for surgical abd.  Labs are reassuring.  Possible GERD but also discussed possibility of cholelithiasis.     I have scheduled pt to return Tuesday am for outpt limited abdominal US.    Pt agrees to PMD f/u, I will refill her prilosec as well.    Pauline Aus, PA-C 02/16/15 1346  Glynn Octave, MD 02/16/15 [phone]

## 2015-05-25 ENCOUNTER — Other Ambulatory Visit: Payer: Medicaid Other

## 2015-05-26 ENCOUNTER — Other Ambulatory Visit (INDEPENDENT_AMBULATORY_CARE_PROVIDER_SITE_OTHER): Payer: Medicaid Other

## 2015-05-26 ENCOUNTER — Other Ambulatory Visit: Payer: Self-pay | Admitting: *Deleted

## 2015-05-26 DIAGNOSIS — E063 Autoimmune thyroiditis: Secondary | ICD-10-CM

## 2015-05-26 LAB — T4, FREE: Free T4: 0.6 ng/dL (ref 0.60–1.60)

## 2015-05-26 LAB — TSH: TSH: 7.94 u[IU]/mL — ABNORMAL HIGH (ref 0.35–5.50)

## 2015-05-26 MED ORDER — LEVOTHYROXINE SODIUM 100 MCG PO TABS: 100.0000 ug | ORAL_TABLET | Freq: Every day | ORAL | Status: DC

## 2015-07-12 ENCOUNTER — Other Ambulatory Visit (INDEPENDENT_AMBULATORY_CARE_PROVIDER_SITE_OTHER): Payer: Medicaid Other

## 2015-07-12 DIAGNOSIS — E063 Autoimmune thyroiditis: Secondary | ICD-10-CM

## 2015-07-12 LAB — T4, FREE: Free T4: 0.77 ng/dL (ref 0.60–1.60)

## 2015-07-12 LAB — TSH: TSH: 4.68 u[IU]/mL — ABNORMAL HIGH (ref 0.35–4.50)

## 2015-07-13 ENCOUNTER — Telehealth: Payer: Self-pay

## 2015-07-13 NOTE — Telephone Encounter (Signed)
Called patient, left message to return phone call for lab results and to schedule next appointment.

## 2015-07-14 ENCOUNTER — Telehealth: Payer: Self-pay

## 2015-07-14 ENCOUNTER — Other Ambulatory Visit: Payer: Medicaid Other

## 2015-07-14 NOTE — Telephone Encounter (Signed)
Called patient, gave patient lab results. Patient states she called yesterday and made appointment, I verified with her of the appointment date. Patient had no other questions or concerns.

## 2015-07-14 NOTE — Telephone Encounter (Signed)
Called patient to give lab results, medication changes, and try to set up an appointment. Patient did not answer, left message for patient to call back. Will try again later.

## 2015-08-25 ENCOUNTER — Ambulatory Visit (INDEPENDENT_AMBULATORY_CARE_PROVIDER_SITE_OTHER): Payer: Self-pay | Admitting: Internal Medicine

## 2015-08-25 ENCOUNTER — Encounter: Payer: Self-pay | Admitting: Internal Medicine

## 2015-08-25 VITALS — BP 128/92 | HR 70 | Wt 257.0 lb

## 2015-08-25 DIAGNOSIS — E063 Autoimmune thyroiditis: Secondary | ICD-10-CM

## 2015-08-25 DIAGNOSIS — E049 Nontoxic goiter, unspecified: Secondary | ICD-10-CM | POA: Insufficient documentation

## 2015-08-25 LAB — T4, FREE: Free T4: 0.88 ng/dL (ref 0.60–1.60)

## 2015-08-25 LAB — TSH: TSH: 11.37 u[IU]/mL — ABNORMAL HIGH (ref 0.35–4.50)

## 2015-08-25 NOTE — Progress Notes (Signed)
Patient ID: Janet NorseMegan V Throckmorton, female   DOB: 10/13/1995, 20 y.o.   MRN: [id number]   HPI  Janet [name] is a 20 y.o.-year-old female, initially referred by her PCP, Dr. Christell ConstantMoore, returning for f/u for hypothyroidism 2/2 Hashimoto thyroiditis and goiter. Last visit 9 mo ago.  We started Levothyroxine 100 mcg daily (dose increased in 05/2015).  She takes the LT4: - am - fasting - with water or ginger ale - + PPI  - no Fe, Ca, MVI  I reviewed pt's thyroid tests: Lab Results  Component Value Date   TSH 4.68 (H) 07/12/2015   TSH 7.94 (H) 05/26/2015   TSH 3.92 11/25/2014   TSH 3.52 06/27/2014   TSH 6.99 (H) 05/09/2014   TSH 3.964 11/25/2013   FREET4 0.77 07/12/2015   FREET4 0.60 05/26/2015   FREET4 0.64 11/25/2014   FREET4 0.68 06/27/2014   FREET4 0.68 05/09/2014   FREET4 0.78 (L) 11/25/2013   Component     Latest Ref Rng 02/26/2013  Thyroid Peroxidase Antibody     <35.0 IU/mL 5991.0 (H)   Pt denies feeling nodules in neck, no hoarseness, + dysphagia - especially with liquids; + occasionally cough in am; no odynophagia in upper chest, no SOB with lying down.  Pt describes: - + fatigue is improved - + hair loss - + heat and cold intolerance - no weight loss, gain - + constipation - + dry skin - legs - no anxiety/no depression  She also has a history of migraines.  ROS: Constitutional: see HPI Eyes: no blurry vision, no xerophthalmia ENT: no sore throat, no nodules palpated in throat, + dysphagia/no odynophagia, no  hoarseness Cardiovascular: no CP/SOB/no palpitations/no leg swelling Respiratory: no cough/SOB Gastrointestinal: no N/V/D/C, + acid reflux Musculoskeletal: no muscle pains/no joint aches Skin: no rashes, no easy bruising, + hair loss Neurological: no tremors/numbness/tingling/dizziness, + HA  I reviewed pt's medications, allergies, PMH, social hx, family hx, and changes were documented in the history of present illness. Otherwise, unchanged from my initial visit  note  PE: BP (!) 128/92 (BP Location: Left Arm, Patient Position: Sitting)   Pulse 70   Wt 257 lb (116.6 kg)   LMP 08/01/2015   BMI 41.48 kg/m  Wt Readings from Last 3 Encounters:  08/25/15 257 lb (116.6 kg)  02/13/15 256 lb (116.1 kg)  12/28/14 260 lb 8 oz (118.2 kg)   Constitutional: obese, in NAD Eyes: PERRLA, EOMI, no exophthalmos ENT: moist mucous membranes, + thyromegaly R>L, no cervical lymphadenopathy Cardiovascular: RRR, No MRG Respiratory: CTA B Gastrointestinal: abdomen soft, NT, ND, BS+ Musculoskeletal: no deformities, strength intact in all 4 Skin: moist, warm, no rashes Neurological: mild tremor with outstretched hands, DTR normal in all 4  ASSESSMENT: 1. Low TSH  2. goiter - thyroid U/S (01/31/2013): no nodules; enlarged thyroid  Right thyroid lobe Measurements: 6.9 x 1.6 x 2.7 cm. Heterogeneous parenchyma, without discrete thyroid nodule. Left thyroid lobe Measurements: 5.6 x 1.8 x 2.4 cm. Heterogeneous parenchyma, without discrete thyroid nodule. Isthmus Thickness: 8 mm in thickness. No nodules visualized. Lymphadenopathy None visualized.  PLAN:  1. Patient with Hashimoto's thyroiditis - on Levothyroxine 100 mcg daily. She takes it correctly. - will check thyroid tests today: TSH, free T4 and add TPO Abs - If these are abnormal, she will need to return in 6 weeks for repeat labs - If these are normal, will repeat them in 6 months - continue the current LT4 dose - RTC in 6 mo ago  2. Goiter -  pt with neck compression sxs, but likely 2/2 GERD - reviewed together thyroid U/S 01/2013: goiter, no nodules - I suggested to take Ibuprofen. I also suggested to try to see an allergist to check on food allergies.  Orders Placed This Encounter  Procedures  . TSH  . T4, free  . Thyroid peroxidase antibody   Component     Latest Ref Rng & Units 08/25/2015  TSH     0.35 - 4.50 uIU/mL 11.37 (H)  T4,Free(Direct)     0.60 - 1.60 ng/dL 6.960.88  Thyroperoxidase Ab  SerPl-aCnc     <9 IU/mL >900 (H)   TSH is again high in the TPO antibodies are still very high. We'll increase her levothyroxine to 137 g daily and recheck her TFTs in 2 months.  Carlus Pavlovristina Najae Rathert, MD PhD [facility]

## 2015-08-25 NOTE — Patient Instructions (Signed)
Please continue Levothyroxine 100 mcg daily.  Take the thyroid hormone every day, with water, at least 30 minutes before breakfast, separated by at least 4 hours from: - acid reflux medications - calcium - iron - multivitamins  Please stop at the lab.  Try to take Ibuprofen for thyroid inflammation.  Please come back for a follow-up appointment in 6 months.

## 2015-08-26 LAB — THYROID PEROXIDASE ANTIBODY: Thyroperoxidase Ab SerPl-aCnc: >900 IU/mL — ABNORMAL HIGH (ref <9)

## 2015-08-29 ENCOUNTER — Other Ambulatory Visit: Payer: Self-pay

## 2015-08-29 ENCOUNTER — Telehealth: Payer: Self-pay

## 2015-08-29 ENCOUNTER — Telehealth: Payer: Self-pay | Admitting: Internal Medicine

## 2015-08-29 DIAGNOSIS — E063 Autoimmune thyroiditis: Secondary | ICD-10-CM

## 2015-08-29 MED ORDER — LEVOTHYROXINE SODIUM 137 MCG PO CAPS: ORAL_CAPSULE | ORAL | 0 refills | Status: DC

## 2015-08-29 NOTE — Telephone Encounter (Signed)
Called and notified patient of lab results, and scheduled patient for lab results, and advised to pick up new medication at pharmacy. Patient had no questions or concerns.

## 2015-08-29 NOTE — Telephone Encounter (Signed)
Called and left message for patient to return call about lab results, advised of new medication that was sent to pharmacy and also advised patient to call back to schedule lab test in 2 months. Left call back number.

## 2015-08-29 NOTE — Telephone Encounter (Signed)
PT returning your phone call, she stated to call her on her cell # [phone]

## 2015-11-02 ENCOUNTER — Other Ambulatory Visit (INDEPENDENT_AMBULATORY_CARE_PROVIDER_SITE_OTHER): Payer: Self-pay

## 2015-11-02 DIAGNOSIS — E063 Autoimmune thyroiditis: Secondary | ICD-10-CM

## 2015-11-02 LAB — TSH: TSH: 12.42 u[IU]/mL — ABNORMAL HIGH (ref 0.35–4.50)

## 2015-11-02 LAB — T4, FREE: Free T4: 0.48 ng/dL — ABNORMAL LOW (ref 0.60–1.60)

## 2015-11-03 ENCOUNTER — Other Ambulatory Visit: Payer: Self-pay

## 2015-11-03 DIAGNOSIS — E063 Autoimmune thyroiditis: Secondary | ICD-10-CM

## 2015-11-03 MED ORDER — LEVOTHYROXINE SODIUM 150 MCG PO TABS: 150.0000 ug | ORAL_TABLET | Freq: Every day | ORAL | 0 refills | Status: DC

## 2015-11-03 NOTE — Telephone Encounter (Signed)
Called patient and gave lab results. Patient had no questions or concerns. Will call back to make lab appointment in 2 months.

## 2016-01-30 ENCOUNTER — Other Ambulatory Visit: Payer: Self-pay | Admitting: Internal Medicine

## 2016-02-26 ENCOUNTER — Encounter: Payer: Self-pay | Admitting: Internal Medicine

## 2016-02-26 ENCOUNTER — Ambulatory Visit (INDEPENDENT_AMBULATORY_CARE_PROVIDER_SITE_OTHER): Payer: Self-pay | Admitting: Internal Medicine

## 2016-02-26 ENCOUNTER — Other Ambulatory Visit: Payer: Self-pay

## 2016-02-26 VITALS — BP 124/84 | HR 75 | Wt 273.0 lb

## 2016-02-26 DIAGNOSIS — E063 Autoimmune thyroiditis: Secondary | ICD-10-CM

## 2016-02-26 DIAGNOSIS — E049 Nontoxic goiter, unspecified: Secondary | ICD-10-CM

## 2016-02-26 LAB — TSH: TSH: 12.48 u[IU]/mL — ABNORMAL HIGH (ref 0.35–4.50)

## 2016-02-26 LAB — T4, FREE: Free T4: 0.83 ng/dL (ref 0.60–1.60)

## 2016-02-26 MED ORDER — LEVOTHYROXINE SODIUM 150 MCG PO TABS: 150.0000 ug | ORAL_TABLET | Freq: Every day | ORAL | 0 refills | Status: DC

## 2016-02-26 NOTE — Patient Instructions (Addendum)
Please continue Levothyroxine 150 mcg daily.  Take the thyroid hormone every day, with water, at least 30 minutes before breakfast, separated by at least 4 hours from: - acid reflux medications - calcium - iron - multivitamins  Please stop at the lab.  Please stop downstairs in [facility] and try to schedule the Barium swallowing test.   Please come back for a follow-up appointment in 6 months.

## 2016-02-26 NOTE — Progress Notes (Addendum)
Patient ID: Janet [name], female   DOB: 05/07/1995, 21 y.o.   MRN: [id number]   HPI  Janet [name] is a 21 y.o.-year-old female, initially referred by her PCP, Dr. Christell Constant, returning for f/u for hypothyroidism 2/2 Hashimoto thyroiditis and goiter. Last visit 6 mo ago.  We started Levothyroxine >> most recent dose change was increased from 137 to 150 g in 10/2015. She did not come back for recheck TFTs afterwards.  She takes the LT4: - am - fasting - with water or ginger ale - + PPI later in the day - no Fe, Ca, MVI  I reviewed pt's thyroid tests: Lab Results  Component Value Date   TSH 12.42 (H) 11/02/2015   TSH 11.37 (H) 08/25/2015   TSH 4.68 (H) 07/12/2015   TSH 7.94 (H) 05/26/2015   TSH 3.92 11/25/2014   TSH 3.52 06/27/2014   FREET4 0.48 (L) 11/02/2015   FREET4 0.88 08/25/2015   FREET4 0.77 07/12/2015   FREET4 0.60 05/26/2015   FREET4 0.64 11/25/2014   FREET4 0.68 06/27/2014   TFTs have been very high before: Component     Latest Ref Rng & Units 08/25/2015  Thyroperoxidase Ab SerPl-aCnc     <9 IU/mL >900 (H)   Component     Latest Ref Rng 02/26/2013  Thyroid Peroxidase Antibody     <35.0 IU/mL 5991.0 (H)   Pt c/o feeling nodules in neck, + occasional hoarseness, + dysphagia - especially with meat  - she has to chew this well; + occasionally cough in am; no odynophagia, no SOB with lying down.  Pt describes: - + Weight gain: 16 pounds per our scale since last visit - no fatigue - + hair loss - no heat/cold intolerance - no constipation - + dry skin - legs - no anxiety/no depression  She also has a history of migraines.  ROS: Constitutional: see HPI Eyes: no blurry vision, no xerophthalmia ENT: no sore throat, + see HPI, + tinnitus Cardiovascular: no CP/SOB/no palpitations/no leg swelling Respiratory: no cough/SOB Gastrointestinal: no N/V/D/C, + acid reflux Musculoskeletal: no muscle pains/no joint aches Skin: no rashes, no easy bruising, + hair  loss Neurological: no tremors/numbness/tingling/dizziness, + HA  I reviewed pt's medications, allergies, PMH, social hx, family hx, and changes were documented in the history of present illness. Otherwise, unchanged from my initial visit note  PE: BP 124/84 (BP Location: Left Arm, Patient Position: Sitting)   Pulse 75   Wt 273 lb (123.8 kg)   LMP 02/18/2016   SpO2 98%   BMI 44.06 kg/m  Wt Readings from Last 3 Encounters:  02/26/16 273 lb (123.8 kg)  08/25/15 257 lb (116.6 kg)  02/13/15 256 lb (116.1 kg)   Constitutional: obese, in NAD Eyes: PERRLA, EOMI, no exophthalmos ENT: moist mucous membranes, + thyromegaly R>L, no cervical lymphadenopathy Cardiovascular: RRR, No MRG Respiratory: CTA B Gastrointestinal: abdomen soft, NT, ND, BS+ Musculoskeletal: no deformities, strength intact in all 4 Skin: moist, warm, no rashes Neurological: no tremor with outstretched hands, DTR normal in all 4  ASSESSMENT: 1. Low TSH  2. goiter - thyroid U/S (01/31/2013): no nodules; enlarged thyroid  Right thyroid lobe Measurements: 6.9 x 1.6 x 2.7 cm. Heterogeneous parenchyma, without discrete thyroid nodule. Left thyroid lobe Measurements: 5.6 x 1.8 x 2.4 cm. Heterogeneous parenchyma, without discrete thyroid nodule. Isthmus Thickness: 8 mm in thickness. No nodules visualized. Lymphadenopathy None visualized.  PLAN:  1. Patient with Hashimoto's thyroiditis - Now on Levothyroxine 150 mcg daily. She takes it  correctly. - will check thyroid tests today: TSH, free T4  - If these are abnormal, she will need to return in 6 weeks for repeat labs - If these are normal, will repeat them in 6 months - continue the current LT4 dose - Return in about 6 months (around 08/25/2016).  2. Goiter - pt with continuing  neck compression sxs. She has dysphagia, cough, occasional aspiration and regurgitation. These sxs are not improving.  - reviewed together thyroid U/S 01/2013: goiter, no nodules; also  reviewed her TPO Ab levels >> very high - at this point I suggested at Ba swallow to check for internal or external Es compression. She is reticent to have surgery, but this may be necessary if the compression is from the thyroid and it is severe - we have to be careful as she has no insurance and pays out of pocket for all imaging tests  Component     Latest Ref Rng & Units 02/26/2016  TSH     0.35 - 4.50 uIU/mL 12.48 (H)  T4,Free(Direct)     0.60 - 1.60 ng/dL 9.140.83   TSH is still high. I still cannot fully understand why she is not observing the increasing doses of levothyroxine. I will suggest to switch to the same dose of Tirosint to see if she absorbs is better.  DG Esophagus  Order: [id number]  Status:  Final result Visible to patient:  No (Not Released) Dx:  Goiter  Details   Reading Physician Reading Date Result Priority  Dwyane DeePaul Barry, MD 02/29/2016   Narrative    CLINICAL DATA: Difficulty swallowing, thyroid goiter  EXAM: ESOPHOGRAM / BARIUM SWALLOW / BARIUM TABLET STUDY  TECHNIQUE: Combined double contrast and single contrast examination performed using effervescent crystals, thick barium liquid, and thin barium liquid. The patient was observed with fluoroscopy swallowing a 13 mm barium sulphate tablet.  FLUOROSCOPY TIME: Fluoroscopy Time: 54 seconds  Radiation Exposure Index (if provided by the fluoroscopic device): 111 mGy  Number of Acquired Spot Images: 0  COMPARISON: None.  FINDINGS: Initially double-contrast barium swallow was performed. The mucosa of the esophagus is unremarkable. Rapid sequence spot films of the cervical esophagus were obtained. There is some waisting of the lower cervical esophagus which can be seen with enlargement of the thyroid gland. Clinical correlation is recommended. Esophageal peristalsis is normal. There is also a small hiatal hernia present. Moderate gastroesophageal reflux is demonstrated with the water siphon maneuver. A  barium pill was given at the end of the study which passed into the stomach without delay.  IMPRESSION: 1. Small hiatal hernia with moderate gastroesophageal reflux. Barium pill passes into the stomach without delay. 2. Some waisting of the lower cervical esophagus may be due to enlargement of the thyroid gland. Correlate clinically.   Electronically Signed By: Dwyane DeePaul Barry M.D. On: 02/29/2016 11:01        She has slight impression from the thyroid on the esophagus. She also has a small hiatal hernia with moderate GERD. I am not sure which of the above has more impact on her swallowing, but at this point I feel we can consider surgery if she prefers. She does need to continue with the PPIs for now and I will also advise her to discuss with her PCP about the findings above.  I called the patient and explained the above findings. She does not feel that she needs surgery quite right now, but will let me know if she develops more neck compression symptoms. She discussed  with her insurance and found out that her bills will be covered if she decides to go ahead with the surgery. The Tirosint is also covered, however, is still very extensive, at $160 a month. In this case, we'll need to continue with levothyroxine but increase the dose as needed. I will send 175 g to her pharmacy for now. Will need labs again in 6-8 weeks.  Carlus Pavlov, MD PhD [facility]

## 2016-02-27 ENCOUNTER — Other Ambulatory Visit: Payer: Self-pay

## 2016-02-27 ENCOUNTER — Telehealth: Payer: Self-pay

## 2016-02-27 MED ORDER — TIROSINT 150 MCG PO CAPS: ORAL_CAPSULE | ORAL | 1 refills | Status: DC

## 2016-02-27 NOTE — Telephone Encounter (Signed)
Called patient and LVM advising to call back to discuss lab work and medications, advised her to call and make lab appointment and gave call back number.

## 2016-02-27 NOTE — Telephone Encounter (Signed)
-----   Message from Carlus Pavlovristina Gherghe, MD sent at 02/27/2016 12:50 PM EST ----- Raynelle FanningJulie, can you please call pt: her TSH is still high, at 12. At this point, I would suggest to switch to Tirosint (liquid levothyroxine) which is better absorbed. I added this to her medication list so please send it to the pharmacy if she agrees. We'll need labs again in 5-6 weeks. Labs are in.

## 2016-02-28 ENCOUNTER — Telehealth: Payer: Self-pay

## 2016-02-28 NOTE — Telephone Encounter (Signed)
Called patient and LVM for patient advising her to call back to discuss insurance options. She needs full medicaid to get the medication, and right now she does not. Gave call back number to discuss further.

## 2016-02-28 NOTE — Telephone Encounter (Signed)
Patient returned phone call regarding the insurance, she will try to figure out what to do and call us back.

## 2016-02-29 ENCOUNTER — Ambulatory Visit
Admission: RE | Admit: 2016-02-29 | Discharge: 2016-02-29 | Disposition: A | Discharge status: Home / Self Care | Payer: No Typology Code available for payment source | Source: Ambulatory Visit | Attending: Internal Medicine | Admitting: Internal Medicine

## 2016-03-01 ENCOUNTER — Telehealth: Payer: Self-pay | Admitting: Internal Medicine

## 2016-03-01 ENCOUNTER — Telehealth: Payer: Self-pay

## 2016-03-01 MED ORDER — LEVOTHYROXINE SODIUM 175 MCG PO TABS: 175.0000 ug | ORAL_TABLET | Freq: Every day | ORAL | 1 refills | Status: DC

## 2016-03-01 NOTE — Telephone Encounter (Signed)
[facility] is calling with questions about medications  dosage  levothyroxine (SYNTHROID, LEVOTHROID) 175 MCG tablet  Please advise

## 2016-03-01 NOTE — Telephone Encounter (Signed)
Did you decide to just go ahead and send in the levothyroxine again, instead of Tirosint? Please advise. Thank you!

## 2016-03-01 NOTE — Addendum Note (Signed)
Addended by: Carlus PavlovGHERGHE, Valita Righter on: 03/01/2016 02:10 PM   Modules accepted: Orders

## 2016-03-01 NOTE — Telephone Encounter (Signed)
Yes, I meant to let you know: I called her and talked to her about the results of the barium swallow and she told me over the phone that she cannot afford to Tirosint. Therefore, I sent a higher dose of levothyroxine to her pharmacy. She is aware and agrees.

## 2016-03-01 NOTE — Telephone Encounter (Signed)
Called and discussed with Pharmacist the dose change. No questions.  

## 2016-03-01 NOTE — Telephone Encounter (Signed)
Called and discussed with Pharmacist the dose change. No questions.

## 2016-04-02 ENCOUNTER — Other Ambulatory Visit: Payer: Self-pay

## 2016-08-26 ENCOUNTER — Ambulatory Visit: Payer: Self-pay | Admitting: Internal Medicine

## 2016-10-16 ENCOUNTER — Encounter: Payer: Self-pay | Admitting: Internal Medicine

## 2016-10-16 ENCOUNTER — Ambulatory Visit (INDEPENDENT_AMBULATORY_CARE_PROVIDER_SITE_OTHER): Payer: Self-pay | Admitting: Internal Medicine

## 2016-10-16 VITALS — BP 120/72 | HR 86 | Wt 276.0 lb

## 2016-10-16 DIAGNOSIS — Z23 Encounter for immunization: Secondary | ICD-10-CM

## 2016-10-16 DIAGNOSIS — E049 Nontoxic goiter, unspecified: Secondary | ICD-10-CM

## 2016-10-16 DIAGNOSIS — E063 Autoimmune thyroiditis: Secondary | ICD-10-CM

## 2016-10-16 LAB — TSH: TSH: 10.57 u[IU]/mL — ABNORMAL HIGH (ref 0.35–4.50)

## 2016-10-16 LAB — T4, FREE: Free T4: 0.71 ng/dL (ref 0.60–1.60)

## 2016-10-16 MED ORDER — OMEPRAZOLE 40 MG PO CPDR: 40.0000 mg | DELAYED_RELEASE_CAPSULE | Freq: Every day | ORAL | 1 refills | Status: DC

## 2016-10-16 NOTE — Progress Notes (Signed)
Patient ID: Janet [name], female   DOB: 12/19/1995, 21 y.o.   MRN: [id number]   HPI  Janet [name] is a 21 y.o.-year-old female, initially referred by her PCP, Dr. Christell Constant, returning for f/u for hypothyroidism 2/2 Hashimoto thyroiditis and goiter. Last visit 8 mo ago. She is here with her mother, who offers part of the history, especially regarding patient's symptom history and medication history.  Since last visit, dysphagia is worse and feels her her voice gives out/hoarseness. Also, she had a sore throat last week.   Reviewed and addended hx: We started Levothyroxine >> most recent dose change was increased from 137 to 150 g in 10/2015, then 175 mcg in 02/2016  Pt is on levothyroxine 175 mcg daily (Tirosint was not covered), taken: - in am - fasting - at least 30 min from b'fast - no Ca, Fe, MVI, PPIs - + Zantac at night prn. - not on Biotin  I reviewed pt's thyroid tests: Lab Results  Component Value Date   TSH 12.48 (H) 02/26/2016   TSH 12.42 (H) 11/02/2015   TSH 11.37 (H) 08/25/2015   TSH 4.68 (H) 07/12/2015   TSH 7.94 (H) 05/26/2015   TSH 3.92 11/25/2014   FREET4 0.83 02/26/2016   FREET4 0.48 (L) 11/02/2015   FREET4 0.88 08/25/2015   FREET4 0.77 07/12/2015   FREET4 0.60 05/26/2015   FREET4 0.64 11/25/2014   + Hashimoto's thyroiditis: Component     Latest Ref Rng & Units 08/25/2015  Thyroperoxidase Ab SerPl-aCnc     <9 IU/mL >900 (H)   Component     Latest Ref Rng 02/26/2013  Thyroid Peroxidase Antibody     <35.0 IU/mL 5991.0 (H)   Reviewed thyroid U/S (01/31/2013): no nodules; enlarged thyroid Right thyroid lobe Measurements: 6.9 x 1.6 x 2.7 cm. Heterogeneous parenchyma, without discrete thyroid nodule. Left thyroid lobe Measurements: 5.6 x 1.8 x 2.4 cm. Heterogeneous parenchyma, without discrete thyroid nodule. Isthmus Thickness: 8 mm in thickness. No nodules visualized. Lymphadenopathy None visualized.  After last visit, we checked a Ba swallow (02/29/2016):   1. Small hiatal hernia with moderate gastroesophageal reflux. Barium pill passes into the stomach without delay. 2. Some waisting of the lower cervical esophagus may be due to enlargement of the thyroid gland. Correlate clinically.  She has slight impression from the thyroid on the esophagus. She also has a small hiatal hernia with moderate GERD. I am not sure which of the above has more impact on her swallowing, but at this point I feel we can consider surgery if she prefers. She does need to continue with the PPIs for now and I will also advise her to discuss with her PCP about the findings above.  I called the patient and explained the above findings. She does not feel that she needs surgery quite right now, but will let me know if she develops more neck compression symptoms. She discussed with her insurance and found out that her bills will be covered if she decides to go ahead with the surgery.   Pt mentions: - feeling nodules in neck - hoarseness - dysphagia mostly with meat - cough - but no SOB with lying down  She also has a history of migraines.  ROS: Constitutional: no weight gain/no weight loss, no fatigue, no subjective hyperthermia, no subjective hypothermia Eyes: no blurry vision, no xerophthalmia ENT: no sore throat, + see HPI Cardiovascular: + CP/no SOB/no palpitations/no leg swelling Respiratory: + cough/no SOB/no wheezing Gastrointestinal: no N/no V/no D/no C/+  acid reflux Musculoskeletal:+ muscle aches/no joint aches Skin: no rashes, + hair loss Neurological: no tremors/no numbness/no tingling/no dizziness, + HA  I reviewed pt's medications, allergies, PMH, social hx, family hx, and changes were documented in the history of present illness. Otherwise, unchanged from my initial visit note.  PE: BP 120/72 (BP Location: Left Arm, Patient Position: Sitting)   Pulse 86   Wt 276 lb (125.2 kg)   LMP 09/27/2016   SpO2 98%   BMI 44.55 kg/m  Wt Readings from Last 3  Encounters:  10/16/16 276 lb (125.2 kg)  02/26/16 273 lb (123.8 kg)  08/25/15 257 lb (116.6 kg)   Constitutional: obese, in NAD Eyes: PERRLA, EOMI, no exophthalmos ENT: moist mucous membranes, + thyromegaly R>L, no cervical lymphadenopathy Cardiovascular: RRR, No MRG Respiratory: CTA B Gastrointestinal: abdomen soft, NT, ND, BS+ Musculoskeletal: no deformities, strength intact in all 4 Skin: moist, warm, no rashes Neurological: no tremor with outstretched hands, DTR normal in all 4  ASSESSMENT: 1. Low TSH  2. Goiter  PLAN:  1. Patient with uncontrolled Hashimoto's thyroiditis  - latest thyroid labs reviewed with pt >> still high TSH >> LT4 dose was increased to 175 mcg daily. I suggested Tirosint >> expensive - she continues on LT4 175 mcg daily - pt feels good on this dose. - we discussed about taking the thyroid hormone every day, with water, >30 minutes before breakfast, separated by >4 hours from acid reflux medications, calcium, iron, multivitamins. Pt. is taking it correctly - will check thyroid tests today: TSH and fT4 - If labs are abnormal, she will need to return for repeat TFTs in 1.5 months - OTW, Return in about 6 months (around 04/16/2017).  2. Goiter - She continues to have significant neck compression symptoms, which have increased since last visit. She feels that her voice is now giving out, and continues to have hoarseness and problems swallowing. She does not have shortness of breath laying down. She also has cough. She does have a history of acid reflux and a small hiatal hernia per her recent barium swallow study. We reviewed the report and also the images of her thyroid ultrasound and barium swallow study along with the patient and her mother. There is a slight impression from the thyroid on the esophagus, however, there is enough esophagus patent based on the barium swallow study, therefore, I am not sure if her symptoms are actually due to thyroid compression or  GERD. Therefore, I suggested a trial of PPIs of 2 weeks and I advised her to let me know if the symptoms persist after this. If they do, we discussed about a referral to surgery for thyroidectomy. - We have to be careful, as she has no insurance and pays out of pocket for all imaging tests and probably for surgery, also   Given flu shot today.   Needs refills.  Component     Latest Ref Rng & Units 10/16/2016  TSH     0.35 - 4.50 uIU/mL 10.57 (H)  T4,Free(Direct)     0.60 - 1.60 ng/dL 1.61   TSH slightly better >> will increase dose to 200 mcg daily and recheck tests in 2 mo.  Carlus Pavlov, MD PhD [facility] Endocrinology

## 2016-10-16 NOTE — Patient Instructions (Addendum)
Please continue Levothyroxine 175 mcg daily.  Take the thyroid hormone every day, with water, at least 30 minutes before breakfast, separated by at least 4 hours from: - acid reflux medications - calcium - iron - multivitamins  Start Omeprazole 40 mg daily for 2 weeks, at lunchtime or later in the day.  Please let me know in 2 weeks if the neck symptoms are better.  Please come back for a follow-up appointment in 6 months.

## 2016-10-17 MED ORDER — LEVOTHYROXINE SODIUM 200 MCG PO TABS: ORAL_TABLET | ORAL | 1 refills | Status: DC

## 2016-10-25 ENCOUNTER — Telehealth: Payer: Self-pay

## 2016-10-25 NOTE — Telephone Encounter (Signed)
-----   Message from Carlus Pavlovristina Gherghe, MD sent at 10/17/2016  5:05 PM EDT ----- Raynelle FanningJulie, can you please call pt: TSH slightly better >> will increase dose to 200 mcg daily and recheck tests in 2 mo. Sent Rx. Labs are in.

## 2016-10-25 NOTE — Telephone Encounter (Signed)
Called patient and gave lab results. Patient had no questions or concerns.  

## 2016-12-25 ENCOUNTER — Other Ambulatory Visit (INDEPENDENT_AMBULATORY_CARE_PROVIDER_SITE_OTHER): Payer: Self-pay

## 2016-12-25 DIAGNOSIS — E063 Autoimmune thyroiditis: Secondary | ICD-10-CM

## 2016-12-25 LAB — TSH: TSH: 13.34 u[IU]/mL — ABNORMAL HIGH (ref 0.35–4.50)

## 2016-12-25 LAB — T4, FREE: Free T4: 0.64 ng/dL (ref 0.60–1.60)

## 2017-04-16 ENCOUNTER — Ambulatory Visit: Payer: BLUE CROSS/BLUE SHIELD | Admitting: Internal Medicine

## 2017-04-16 DIAGNOSIS — E049 Nontoxic goiter, unspecified: Secondary | ICD-10-CM

## 2017-04-16 DIAGNOSIS — E063 Autoimmune thyroiditis: Secondary | ICD-10-CM | POA: Diagnosis not present

## 2017-04-16 LAB — TSH: TSH: 9.04 u[IU]/mL — ABNORMAL HIGH (ref 0.35–4.50)

## 2017-04-16 LAB — T4, FREE: Free T4: 0.56 ng/dL — ABNORMAL LOW (ref 0.60–1.60)

## 2017-04-16 NOTE — Patient Instructions (Signed)
Please continue Levothyroxine 200 mcg daily.  Take the thyroid hormone every day, with water, at least 30 minutes before breakfast, separated by at least 4 hours from: - acid reflux medications - calcium - iron - multivitamins  Please come back for a follow-up appointment in 6 months.

## 2017-04-16 NOTE — Progress Notes (Signed)
Patient ID: Janet NorseMegan V [name], female   DOB: 04/28/1995, 22 y.o.   MRN: [id number]   HPI  Janet [name] is a 22 y.o.-year-old female, initially referred by her PCP, Dr. Christell ConstantMoore, returning for f/u for hypothyroidism 2/2 Hashimoto thyroiditis and goiter. Last visit 6 months ago.  She is here with her mother who offers part of the history, especially regarding patient's symptoms and medication history.  Reviewed and addended hx: We started Levothyroxine >> most recent dose change was increased from 137 to 150 g in 10/2015, then 175 mcg in 02/2016.  In 10/2016, we increased to 200 mcg daily.  Pt takes levothyroxine 200 mcg daily - increased 12/2017: - in am - fasting - coffee+ creamer - occasionally, usually more than 30 minutes after levothyroxine - at least 30 min from b'fast - no Ca, Fe, MVI, PPIs - + Zantac occasionally at night - not on Biotin  Reviewed TFTs: Lab Results  Component Value Date   TSH 13.34 (H) 12/25/2016   TSH 10.57 (H) 10/16/2016   TSH 12.48 (H) 02/26/2016   TSH 12.42 (H) 11/02/2015   TSH 11.37 (H) 08/25/2015   TSH 4.68 (H) 07/12/2015   FREET4 0.64 12/25/2016   FREET4 0.71 10/16/2016   FREET4 0.83 02/26/2016   FREET4 0.48 (L) 11/02/2015   FREET4 0.88 08/25/2015   FREET4 0.77 07/12/2015   She has Hashimoto's thyroiditis- very elevated TPO antibodies: Component     Latest Ref Rng & Units 08/25/2015  Thyroperoxidase Ab SerPl-aCnc     <9 IU/mL >900 (H)   Component     Latest Ref Rng 02/26/2013  Thyroid Peroxidase Antibody     <35.0 IU/mL 5991.0 (H)   Reviewed her imaging studies reports: Thyroid U/S (01/31/2013):  no nodules, enlarged thyroid Right thyroid lobe Measurements: 6.9 x 1.6 x 2.7 cm. Heterogeneous parenchyma, without discrete thyroid nodule. Left thyroid lobe Measurements: 5.6 x 1.8 x 2.4 cm. Heterogeneous parenchyma, without discrete thyroid nodule. Isthmus Thickness: 8 mm in thickness. No nodules visualized. Lymphadenopathy None visualized.  Ba  swallow (02/29/2016):  1. Small hiatal hernia with moderate gastroesophageal reflux. Barium pill passes into the stomach without delay. 2. Some waisting of the lower cervical esophagus may be due to enlargement of the thyroid gland. Correlate clinically.  She has slight impression from the thyroid on the esophagus. She also has a small hiatal hernia with moderate GERD. I am not sure which of the above has more impact on her swallowing. I called the patient and explained the above findings. She did not feel that she needed surgery then, but advised her to let me know if she develops more neck compression symptoms.    Pt  mentions: - feeling nodules in neck - hoarseness - dysphagia mostly with bread - choking  But denies: - SOB with lying down  She also has a history of migraines.  ROS: Constitutional: no weight gain/no weight loss, no fatigue, + subjective hyperthermia, + subjective hypothermia, + nocturia Eyes: no blurry vision, no xerophthalmia ENT: no sore throat, + see HPI Cardiovascular: no CP/no SOB/no palpitations/no leg swelling Respiratory: + cough/no SOB/no wheezing Gastrointestinal: no N/no V/no D/no C/+ acid reflux Musculoskeletal: no muscle aches/no joint aches Skin: no rashes, no hair loss Neurological: no tremors/no numbness/no tingling/no dizziness  I reviewed pt's medications, allergies, PMH, social hx, family hx, and changes were documented in the history of present illness. Otherwise, unchanged from my initial visit note.  PE: There were no vitals taken for this visit. Wt Readings  from Last 3 Encounters:  10/16/16 276 lb (125.2 kg)  02/26/16 273 lb (123.8 kg)  08/25/15 257 lb (116.6 kg)   Constitutional: overweight, in NAD Eyes: PERRLA, EOMI, no exophthalmos ENT: moist mucous membranes,+ asymmetrical thyromegaly R>L, no cervical lymphadenopathy Cardiovascular: RRR, No MRG Respiratory: CTA B Gastrointestinal: abdomen soft, NT, ND, BS+ Musculoskeletal: no  deformities, strength intact in all 4 Skin: moist, warm, no rashes Neurological: no tremor with outstretched hands, DTR normal in all 4  ASSESSMENT: 1. Low TSH  2. Goiter  PLAN:  1. Patient with uncontrolled Hashimoto's thyroiditis - Her thyroid tests returned abnormal at last visit (TSH 10.57) after which we increased her levothyroxine to 200 mcg daily.  However, repeat TSH obtained 12/2016 was still high and that that time there was a question whether her omeprazole could have interfered with her levothyroxine absorption.  I sent her a message asking her if she is separating the omeprazole from levothyroxine, but she did not respond.  Of note, in the past we tried to use Tirosint, however, this was not covered. - she continues on LT4 200 mcg daily - pt feels a little better on this dose. - we discussed about taking the thyroid hormone every day, with water, >30 minutes before breakfast, separated by >4 hours from acid reflux medications, calcium, iron, multivitamins. Pt. is taking it correctly. - will check thyroid tests today: TSH and fT4 - If labs are abnormal, she will need to return for repeat TFTs in 1.5 months  2. Goiter -She continues to have significant neck compression symptoms, and her voice giving out.  Continues to have hoarseness and problems swallowing.  No shortness of breath laying down.  She has a history of acid reflux and a small hiatal hernia as seen on the barium swallow study.  We reviewed the results of the study and also the report of her latest thyroid ultrasound from 01/31/2013 along with her and her mother.  There was a slight impression of the thyroid on the esophagus, however, the esophagus appeared widely patent.  There were no nodules or goiter seen on the ultrasound.  It is difficult to discern whether her symptoms are due to thyroid compression or GERD.  Therefore, at last visit, I suggested that she tried PPIs for 2 weeks to see if her symptoms persist.  We could  proceed with evaluation for thyroidectomy if the symptoms are not relieved by PPIs.   - At this visit, she tells me that she tried a PPI but did not feel much of a difference - She also tells me that she now has insurance, and we could go ahead with surgery, if possible.  I agree with total thyroidectomy since she has significant distress from her goiter and also based on the new studies showing increased quality of life for patients with Hashimoto's thyroiditis along with other symptoms after total thyroidectomy.  We will refer her to Dr. Gerrit Friends.  Needs refills.  - time spent with the patient: 25 minutes, of which >50% was spent in obtaining information about her symptoms, reviewing her previous labs, evaluations, and treatments, counseling her about her condition (please see the discussed topics above), and developing a plan to further investigate and treat it.  Component     Latest Ref Rng & Units 04/16/2017  TSH     0.35 - 4.50 uIU/mL 9.04 (H)  T4,Free(Direct)     0.60 - 1.60 ng/dL 6.96 (L)   Thyroid tests are improving, but are still not normal.  We will increase the dose further to 250 mcg daily.  We will have her back for labs in 1.5 months.  Carlus Pavlov, MD PhD [facility] Endocrinology

## 2017-04-17 ENCOUNTER — Encounter: Payer: Self-pay | Admitting: Internal Medicine

## 2017-04-17 MED ORDER — LEVOTHYROXINE SODIUM 50 MCG PO TABS: 50.0000 ug | ORAL_TABLET | Freq: Every day | ORAL | 3 refills | Status: DC

## 2017-04-17 MED ORDER — LEVOTHYROXINE SODIUM 200 MCG PO TABS: ORAL_TABLET | ORAL | 3 refills | Status: DC

## 2017-05-09 ENCOUNTER — Telehealth: Payer: Self-pay | Admitting: Internal Medicine

## 2017-05-09 NOTE — Telephone Encounter (Addendum)
Patient mom stated that daughter was referred to a surgeon and haven't got a call yet. Please advise   mom Marylu Lund) [phone]

## 2017-05-13 NOTE — Telephone Encounter (Signed)
Spoke to patient's mother. Apologized for delay. Explained referral process Provided referral info. Pt will contact surgeon's office to see about scheduling.

## 2017-06-03 ENCOUNTER — Ambulatory Visit: Payer: Self-pay | Admitting: Surgery

## 2017-06-03 DIAGNOSIS — E063 Autoimmune thyroiditis: Secondary | ICD-10-CM | POA: Diagnosis not present

## 2017-06-03 DIAGNOSIS — E049 Nontoxic goiter, unspecified: Secondary | ICD-10-CM | POA: Diagnosis not present

## 2017-06-03 DIAGNOSIS — E038 Other specified hypothyroidism: Secondary | ICD-10-CM | POA: Diagnosis not present

## 2017-06-05 ENCOUNTER — Ambulatory Visit: Payer: BLUE CROSS/BLUE SHIELD | Admitting: Family Medicine

## 2017-06-05 ENCOUNTER — Encounter: Payer: Self-pay | Admitting: Family Medicine

## 2017-06-05 VITALS — BP 129/85 | HR 76 | Temp 98.3°F | Ht 66.0 in | Wt 288.0 lb

## 2017-06-05 DIAGNOSIS — F411 Generalized anxiety disorder: Secondary | ICD-10-CM | POA: Diagnosis not present

## 2017-06-05 DIAGNOSIS — E063 Autoimmune thyroiditis: Secondary | ICD-10-CM

## 2017-06-05 DIAGNOSIS — Z720 Tobacco use: Secondary | ICD-10-CM | POA: Diagnosis not present

## 2017-06-05 DIAGNOSIS — R002 Palpitations: Secondary | ICD-10-CM | POA: Diagnosis not present

## 2017-06-05 DIAGNOSIS — Z6841 Body Mass Index (BMI) 40.0 and over, adult: Secondary | ICD-10-CM | POA: Diagnosis not present

## 2017-06-05 DIAGNOSIS — Z114 Encounter for screening for human immunodeficiency virus [HIV]: Secondary | ICD-10-CM

## 2017-06-05 DIAGNOSIS — Z7689 Persons encountering health services in other specified circumstances: Secondary | ICD-10-CM

## 2017-06-05 MED ORDER — ESCITALOPRAM OXALATE 10 MG PO TABS: 10.0000 mg | ORAL_TABLET | Freq: Every day | ORAL | 1 refills | Status: DC

## 2017-06-05 NOTE — Patient Instructions (Signed)
Follow up with me in 4-6 weeks for your anxiety.  Schedule your physical exam w/ pap smear at some point too.  You had labs performed today.  You will be contacted with the results of the labs once they are available, usually in the next 3 days for routine lab work.  Pap Test Why am I having this test? A pap test is sometimes called a pap smear. It is a screening test that is used to check for signs of cancer of the vagina, cervix, and uterus. The test can also identify the presence of infection or precancerous changes. Your health care provider will likely recommend you have this test done on a regular basis. This test may be done:  Every 3 years, starting at age 11.  Every 5 years, in combination with testing for the presence of human papillomavirus (HPV).  More or less often depending on other medical conditions.  What kind of sample is taken? Using a small cotton swab, plastic spatula, or brush, your health care provider will collect a sample of cells from the surface of your cervix. Your cervix is the opening to your uterus, also called a womb. Secretions from the cervix and vagina may also be collected. How do I prepare for this test?  Be aware of where you are in your menstrual cycle. You may be asked to reschedule the test if you are menstruating on the day of the test.  You may need to reschedule if you have a known vaginal infection on the day of the test.  You may be asked to avoid douching or taking a bath the day before or the day of the test.  Some medicines can cause abnormal test results, such as digitalis and tetracycline. Talk with your health care provider before your test if you take one of these medicines. What do the results mean? Abnormal test results may indicate a number of health conditions. These may include:  Cancer. Although pap test results cannot be used to diagnose cancer of the cervix, vagina, or uterus, they may suggest the possibility of cancer. Further  tests would be required to determine if cancer is present.  Sexually transmitted disease.  Fungal infection.  Parasite infection.  Herpes infection.  A condition causing or contributing to infertility.  It is your responsibility to obtain your test results. Ask the lab or department performing the test when and how you will get your results. Contact your health care provider to discuss any questions you have about your results. Talk with your health care provider to discuss your results, treatment options, and if necessary, the need for more tests. Talk with your health care provider if you have any questions about your results. This information is not intended to replace advice given to you by your health care provider. Make sure you discuss any questions you have with your health care provider. Document Released: 03/16/2002 Document Revised: 08/30/2015 Document Reviewed: 05/17/2013 Elsevier Interactive Patient Education  [employer].

## 2017-06-05 NOTE — Progress Notes (Signed)
Subjective: HE:NIDPOEUMP care, anxiety HPI: Janet [name] is a 22 y.o. female presenting to clinic today for:  1. Anxiety Patient reports a long-standing history of anxiety type symptoms, citing that she will typically have panic attacks when she is under significant stress.  She describes the panic attacks as a sharp pain in her chest that lasts about 10 minutes then resolves after she is able to sit quietly away from her stressors.  She does report sensation of her heart racing during these episodes.  She cites that she often has mood swings and is easily irritable.  She is never been diagnosed with anxiety.  No history of hospitalizations for mental health disorder.  Past medical history significant for congenital heart murmur that resolved and Hashimoto's thyroiditis.  Family history significant for anxiety in her mother.  No SI, HI, visual or auditory hallucinations.  She is an active every day smoker.  2. Hashimoto's disease Patient followed by endocrinology for Hashimoto's disease.  She is actually scheduled to have a thyroidectomy on 07/01/2017.  She notes some anxiety about this but is looking forward to feeling better.  She is currently taking 250 mcg of Synthroid daily.   Past Medical History:  Diagnosis Date  . Allergy   . Anxiety   . GERD (gastroesophageal reflux disease)   . Headache(784.0)   . Heart murmur 1996  . Thyroid disease    Past Surgical History:  Procedure Laterality Date  . TONSILLECTOMY AND ADENOIDECTOMY Bilateral 01/2011   Social History   Socioeconomic History  . Marital status: Single    Spouse name: Not on file  . Number of children: Not on file  . Years of education: Not on file  . Highest education level: Not on file  Occupational History  . Not on file  Social Needs  . Financial resource strain: Not on file  . Food insecurity:    Worry: Not on file    Inability: Not on file  . Transportation needs:    Medical: Not on file    Non-medical:  Not on file  Tobacco Use  . Smoking status: Current Some Day Smoker    Packs/day: 0.50    Years: 2.00    Pack years: 1.00    Types: Cigarettes  . Smokeless tobacco: Never Used  Substance and Sexual Activity  . Alcohol use: No  . Drug use: No  . Sexual activity: Never  Lifestyle  . Physical activity:    Days per week: Not on file    Minutes per session: Not on file  . Stress: Not on file  Relationships  . Social connections:    Talks on phone: Not on file    Gets together: Not on file    Attends religious service: Not on file    Active member of club or organization: Not on file    Attends meetings of clubs or organizations: Not on file    Relationship status: Not on file  . Intimate partner violence:    Fear of current or ex partner: Not on file    Emotionally abused: Not on file    Physically abused: Not on file    Forced sexual activity: Not on file  Other Topics Concern  . Not on file  Social History Narrative  . Not on file   Current Meds  Medication Sig  . levothyroxine (SYNTHROID, LEVOTHROID) 200 MCG tablet Take 1 tablet daily in am. Total daily dose 250 mcg daily.  Marland Kitchen levothyroxine (SYNTHROID,  LEVOTHROID) 50 MCG tablet Take 1 tablet (50 mcg total) by mouth daily. Total daily dose 250 mcg daily.   Family History  Problem Relation Age of Onset  . Heart attack Maternal Grandmother   . Heart Problems Maternal Grandfather   . Heart attack Maternal Grandfather   . Cancer Paternal Grandmother   . Cancer Paternal Grandfather   . Diabetes Mother   . Other Mother        gastropresis  . Anxiety disorder Mother   . Other Father        syncope-has a pacemaker  . COPD Father    No Known Allergies   Health Maintenance: Pap needed  Objective: Office vital signs reviewed. BP 129/85   Pulse 76   Temp 98.3 F (36.8 C) (Oral)   Ht 5' 6"  (1.676 m)   Wt 288 lb (130.6 kg)   BMI 46.48 kg/m   Physical Examination:  General: Awake, alert, obese, No acute  distress HEENT: Normal    Neck: No lymphadenopathy; +enlarged thyroid      Eyes: PERRLA, extraocular movement in tact, sclera white; no exophthalmos    Throat: moist mucus membranes, no erythema, tonsils surgically absent.  Airway is patent Cardio: regular rate and rhythm, S1S2 heard, no murmurs appreciated Pulm: clear to auscultation bilaterally, no wheezes, rhonchi or rales; normal work of breathing on room air Extremities: warm, well perfused, No edema, cyanosis or clubbing; +2 pulses bilaterally MSK: normal gait and normal station Skin: dry; intact; no rashes or lesions Neuro: no focal neurologic deficits Psych: Mood stable, speech normal, affect appropriate, pleasant, interactive, does not appear to be responding to internal stimuli  Depression screen [facility] 2/9 06/05/2017 12/07/2014 01/21/2014  Decreased Interest 0 0 0  Down, Depressed, Hopeless 0 0 0  PHQ - 2 Score 0 0 0   GAD 7 : Generalized Anxiety Score 06/05/2017  Nervous, Anxious, on Edge 1  Control/stop worrying 3  Worry too much - different things 3  Trouble relaxing 2  Restless 1  Easily annoyed or irritable 3  Afraid - awful might happen 2  Total GAD 7 Score 15    Assessment/ Plan: 22 y.o. female   1. GAD (generalized anxiety disorder) PHQ 9 score of 0, gad 7 score of 15.  No previous diagnosis.  We discussed that thyroid disease may also be contributing to anxiety symptoms.  She does wish to proceed with medications for anxiety disorder.  We discussed options and will proceed with Lexapro 10 mg daily.  Possible side effects reviewed with the patient.  Reasons for return and emergent evaluation discussed with the patient.  She will follow-up with me in 4 to 6 weeks for recheck. - CMP14+EGFR - CBC with Differential  2. Morbid obesity with BMI of 45.0-49.9, adult ([place]) We will check CMP and CBC.  May need to check lipids at some point.  May improve with thyroidectomy and treatment for hypothyroidism.  We will continue to  support patient as able.  Encourage lifestyle modification, smoking cessation. - CMP14+EGFR - CBC with Differential  3. Establishing care with new doctor, encounter for  4. Screening for HIV (human immunodeficiency virus) - HIV antibody (with reflex)  5. Hashimoto's thyroiditis - CMP14+EGFR - CBC with Differential  6. Tobacco abuse Counseling provided.  Highly recommended that she discontinue smoking prior to surgery.  Offered nicotine patches.  Wellbutrin considered but patient has anxiety symptoms so likely not a good choice.  Could also consider Chantix.  Patient to schedule her  first Pap smear.  [name], DO [place] [phone]

## 2017-06-06 LAB — CBC WITH DIFFERENTIAL/PLATELET
Basophils Absolute: 0 10*3/uL (ref 0.0–0.2)
Basos: 0 %
EOS (ABSOLUTE): 0.1 10*3/uL (ref 0.0–0.4)
Eos: 1 %
Hematocrit: 38.7 % (ref 34.0–46.6)
Hemoglobin: 13 g/dL (ref 11.1–15.9)
Immature Grans (Abs): 0 10*3/uL (ref 0.0–0.1)
Immature Granulocytes: 0 %
Lymphocytes Absolute: 2.7 10*3/uL (ref 0.7–3.1)
Lymphs: 30 %
MCH: 29.6 pg (ref 26.6–33.0)
MCHC: 33.6 g/dL (ref 31.5–35.7)
MCV: 88 fL (ref 79–97)
Monocytes Absolute: 0.6 10*3/uL (ref 0.1–0.9)
Monocytes: 6 %
Neutrophils Absolute: 5.5 10*3/uL (ref 1.4–7.0)
Neutrophils: 63 %
Platelets: 318 10*3/uL (ref 150–450)
RBC: 4.39 x10E6/uL (ref 3.77–5.28)
RDW: 13.7 % (ref 12.3–15.4)
WBC: 8.9 10*3/uL (ref 3.4–10.8)

## 2017-06-06 LAB — CMP14+EGFR
ALT: 9 IU/L (ref 0–32)
AST: 10 IU/L (ref 0–40)
Albumin/Globulin Ratio: 1.6 (ref 1.2–2.2)
Albumin: 4.1 g/dL (ref 3.5–5.5)
Alkaline Phosphatase: 89 IU/L (ref 39–117)
BUN/Creatinine Ratio: 12 (ref 9–23)
BUN: 10 mg/dL (ref 6–20)
Bilirubin Total: 0.4 mg/dL (ref 0.0–1.2)
CO2: 21 mmol/L (ref 20–29)
Calcium: 9.1 mg/dL (ref 8.7–10.2)
Chloride: 100 mmol/L (ref 96–106)
Creatinine, Ser: 0.81 mg/dL (ref 0.57–1.00)
GFR calc Af Amer: 119 mL/min/{1.73_m2} (ref >59)
GFR calc non Af Amer: 103 mL/min/{1.73_m2} (ref >59)
Globulin, Total: 2.6 g/dL (ref 1.5–4.5)
Glucose: 85 mg/dL (ref 65–99)
Potassium: 4.1 mmol/L (ref 3.5–5.2)
Sodium: 138 mmol/L (ref 134–144)
Total Protein: 6.7 g/dL (ref 6.0–8.5)

## 2017-06-06 LAB — HIV ANTIBODY (ROUTINE TESTING W REFLEX): HIV Screen 4th Generation wRfx: NONREACTIVE

## 2017-06-17 ENCOUNTER — Encounter (HOSPITAL_COMMUNITY): Payer: Self-pay

## 2017-06-17 NOTE — Patient Instructions (Signed)
Janet NorseMegan V [name]  06/17/2017   Your procedure is scheduled on:  07-01-17  Report to [facility] Main  Entrance  Report to admitting at     0530 AM    Call this number if you have problems the morning of surgery [phone]   Remember: Do not eat food or drink liquids :After Midnight.     Take these medicines the morning of surgery with A SIP OF WATER: levothyroxine, lexapro                                You may not have any metal on your body including hair pins and              piercings  Do not wear jewelry, make-up, lotions, powders or perfumes, deodorant             Do not wear nail polish.  Do not shave  48 hours prior to surgery.     Do not bring valuables to the hospital. [facility] IS NOT             RESPONSIBLE   FOR VALUABLES.  Contacts, dentures or bridgework may not be worn into surgery.  Leave suitcase in the car. After surgery it may be brought to your room.              Please read over the following fact sheets you were given: _____________________________________________________________________             [facility] Before surgery, you can play an important role.  Because skin is not sterile, your skin needs to be as free of germs as possible.  You can reduce the number of germs on your skin by washing with CHG (chlorahexidine gluconate) soap before surgery.  CHG is an antiseptic cleaner which kills germs and bonds with the skin to continue killing germs even after washing. Please DO NOT use if you have an allergy to CHG or antibacterial soaps.  If your skin becomes reddened/irritated stop using the CHG and inform your nurse when you arrive at Short Stay. Do not shave (including legs and underarms) for at least 48 hours prior to the first CHG shower.  You may shave your face/neck. Please follow these instructions carefully:  1.  Shower with CHG Soap the night before surgery and the  morning of Surgery.  2.  If you  choose to wash your hair, wash your hair first as usual with your  normal  shampoo.  3.  After you shampoo, rinse your hair and body thoroughly to remove the  shampoo.                           4.  Use CHG as you would any other liquid soap.  You can apply chg directly  to the skin and wash                       Gently with a scrungie or clean washcloth.  5.  Apply the CHG Soap to your body ONLY FROM THE NECK DOWN.   Do not use on face/ open  Wound or open sores. Avoid contact with eyes, ears mouth and genitals (private parts).                       Wash face,  Genitals (private parts) with your normal soap.             6.  Wash thoroughly, paying special attention to the area where your surgery  will be performed.  7.  Thoroughly rinse your body with warm water from the neck down.  8.  DO NOT shower/wash with your normal soap after using and rinsing off  the CHG Soap.                9.  Pat yourself dry with a clean towel.            10.  Wear clean pajamas.            11.  Place clean sheets on your bed the night of your first shower and do not  sleep with pets. Day of Surgery : Do not apply any lotions/deodorants the morning of surgery.  Please wear clean clothes to the hospital/surgery center.  FAILURE TO FOLLOW THESE INSTRUCTIONS MAY RESULT IN THE CANCELLATION OF YOUR SURGERY PATIENT SIGNATURE_________________________________  NURSE SIGNATURE__________________________________  ________________________________________________________________________

## 2017-06-18 ENCOUNTER — Encounter (HOSPITAL_COMMUNITY): Payer: Self-pay

## 2017-06-18 ENCOUNTER — Other Ambulatory Visit: Payer: Self-pay

## 2017-06-18 ENCOUNTER — Encounter (HOSPITAL_COMMUNITY)
Admission: RE | Admit: 2017-06-18 | Discharge: 2017-06-18 | Disposition: A | Discharge status: Home / Self Care | Payer: BLUE CROSS/BLUE SHIELD | Source: Ambulatory Visit | Attending: Surgery | Admitting: Surgery

## 2017-06-18 ENCOUNTER — Ambulatory Visit (HOSPITAL_COMMUNITY)
Admission: RE | Admit: 2017-06-18 | Discharge: 2017-06-18 | Disposition: A | Discharge status: Home / Self Care | Payer: BLUE CROSS/BLUE SHIELD | Source: Ambulatory Visit | Attending: Anesthesiology | Admitting: Anesthesiology

## 2017-06-18 DIAGNOSIS — R05 Cough: Secondary | ICD-10-CM | POA: Diagnosis not present

## 2017-06-18 DIAGNOSIS — Z01818 Encounter for other preprocedural examination: Secondary | ICD-10-CM | POA: Diagnosis not present

## 2017-06-30 ENCOUNTER — Encounter (HOSPITAL_COMMUNITY): Payer: Self-pay | Admitting: Surgery

## 2017-06-30 DIAGNOSIS — E039 Hypothyroidism, unspecified: Secondary | ICD-10-CM | POA: Diagnosis present

## 2017-06-30 MED ORDER — DEXTROSE 5 % IV SOLN
3.0000 g | INTRAVENOUS | Status: AC
  Administered 2017-07-01: 3 g via INTRAVENOUS
  Filled 2017-06-30: qty 3

## 2017-06-30 NOTE — H&P (Signed)
General Surgery [facility]  Janet [name] DOB: [birth year] Single / Language: [place] / Race: White Female   History of Present Illness  The patient is a 22 year old female who presents with a complaint of Enlarged thyroid.  CHIEF COMPLAINT: Hashimoto thyroiditis, thyroid goiter, compressive symptoms  Patient is referred by Dr. Carlus Pavlov for surgical evaluation and management of Hashimoto's thyroiditis with thyroid goiter with compressive symptoms. Patient has been symptomatic for 2-3 years. She is now taking levothyroxine. She is currently taking 250 g daily. Most recent TSH level remained elevated at 9.04. Patient last had an ultrasound examination in 2015. This showed an enlarged thyroid gland without nodules. There was heterogeneous parenchyma consistent with thyroiditis. Laboratory studies confirmed Hashimoto's thyroiditis. Patient complains of multiple compressive symptoms including solid food dysphagia, alterations in voice quality, right sided neck pain, and globus sensation. Patient has had no prior head or neck surgery other than tonsillectomy. There is no family history of thyroid disease or other endocrine neoplasms. Patient works at [employer]. She is accompanied today by her mother.   Past Surgical History Tonsillectomy   Diagnostic Studies History Colonoscopy  never Mammogram  never Pap Smear  never  Allergies No Known Allergies [06/03/2017]: Allergies Reconciled   Medication History Levothyroxine Sodium ( Tablet, Oral) Active. Ibuprofen (200MG  Tablet, Oral) Active. Medications Reconciled  Social History Alcohol use  Occasional alcohol use. Caffeine use  Carbonated beverages, Tea. No drug use  Tobacco use  Current every day smoker.  Family History Arthritis  Mother. Diabetes Mellitus  Mother. Heart Disease  Father. Heart disease in female family member before age 72  Hypertension   Mother.  Pregnancy / Birth History Age at menarche  11 years. Gravida  0 Para  0 Regular periods   Other Problems Gastroesophageal Reflux Disease  Heart murmur  Migraine Headache  Thyroid Disease   Review of Systems General Present- Weight Gain and Weight Loss. Not Present- Appetite Loss, Chills, Fatigue, Fever and Night Sweats. Skin Not Present- Change in Wart/Mole, Dryness, Hives, Jaundice, New Lesions, Non-Healing Wounds, Rash and Ulcer. HEENT Present- Earache, Hoarseness and Ringing in the Ears. Not Present- Hearing Loss, Nose Bleed, Oral Ulcers, Seasonal Allergies, Sinus Pain, Sore Throat, Visual Disturbances, Wears glasses/contact lenses and Yellow Eyes. Respiratory Present- Chronic Cough. Not Present- Bloody sputum, Difficulty Breathing, Snoring and Wheezing. Breast Not Present- Breast Mass, Breast Pain, Nipple Discharge and Skin Changes. Cardiovascular Present- Chest Pain and Swelling of Extremities. Not Present- Difficulty Breathing Lying Down, Leg Cramps, Palpitations, Rapid Heart Rate and Shortness of Breath. Gastrointestinal Present- Abdominal Pain, Difficulty Swallowing, Gets full quickly at meals, Indigestion, Nausea and Vomiting. Not Present- Bloating, Bloody Stool, Change in Bowel Habits, Chronic diarrhea, Constipation, Excessive gas, Hemorrhoids and Rectal Pain. Female Genitourinary Present- Nocturia. Not Present- Frequency, Painful Urination, Pelvic Pain and Urgency. Musculoskeletal Present- Muscle Pain. Not Present- Back Pain, Joint Pain, Joint Stiffness, Muscle Weakness and Swelling of Extremities. Neurological Present- Headaches. Not Present- Decreased Memory, Fainting, Numbness, Seizures, Tingling, Tremor, Trouble walking and Weakness. Psychiatric Present- Anxiety. Not Present- Bipolar, Change in Sleep Pattern, Depression, Fearful and Frequent crying. Endocrine Not Present- Cold Intolerance, Excessive Hunger, Hair Changes, Heat Intolerance, Hot flashes and New  Diabetes. Hematology Present- Easy Bruising. Not Present- Blood Thinners, Excessive bleeding, Gland problems, HIV and Persistent Infections.  Vitals Weight: 287.25 lb Height: 66in Body Surface Area: 2.33 m Body Mass Index: 46.36 kg/m  Temp.: 98.35F(Oral)  Pulse: 72 (Regular)  BP: 136/84 (Sitting, Right Arm, Large)  Physical  Exam  See vital signs recorded above  GENERAL APPEARANCE Development: normal Nutritional status: normal Gross deformities: none  SKIN Rash, lesions, ulcers: none Induration, erythema: none Nodules: none palpable  EYES Conjunctiva and lids: normal Pupils: equal and reactive Iris: normal bilaterally  EARS, NOSE, MOUTH, THROAT External ears: no lesion or deformity External nose: no lesion or deformity Hearing: grossly normal Lips: no lesion or deformity Dentition: normal for age Oral mucosa: moist  NECK Symmetric: yes Trachea: midline Thyroid: Thyroid gland is diffusely enlarged, right side greater than left, relatively firm to palpation, nontender. There are no discrete or dominant nodules palpable.  CHEST Respiratory effort: normal Retraction or accessory muscle use: no Breath sounds: normal bilaterally Rales, rhonchi, wheeze: none  CARDIOVASCULAR Auscultation: regular rhythm, normal rate Murmurs: none Pulses: carotid and radial pulse 2+ palpable Lower extremity edema: none Lower extremity varicosities: none  MUSCULOSKELETAL Station and gait: normal Digits and nails: no clubbing or cyanosis Muscle strength: grossly normal all extremities Range of motion: grossly normal all extremities Deformity: none  LYMPHATIC Cervical: none palpable Supraclavicular: none palpable  PSYCHIATRIC Oriented to person, place, and time: yes Mood and affect: normal for situation Judgment and insight: appropriate for situation    Assessment & Plan  HYPOTHYROIDISM DUE TO HASHIMOTO'S THYROIDITIS (E03.8) ENLARGED THYROID (E04.9)  Pt  Education - Pamphlet Given - The Thyroid Book: discussed with patient and provided information.  Patient presents on referral from her endocrinologist for evaluation for thyroidectomy for surgical management of Hashimoto's thyroiditis, thyroid goiter, and mild to moderate compressive symptoms. Patient is accompanied by her family. They're provided with written literature to review at home.  I discussed total thyroidectomy with the patient. We discussed the surgical procedure. We discussed the hospital stay to be anticipated. We discussed postoperative recovery and return to work. We discussed potential complications from the surgery including changes in voice quality and problems with maintaining calcium levels. We discussed the location and size of her surgical incision. Patient understands and wishes to proceed with surgery in the near future.  The risks and benefits of the procedure have been discussed at length with the patient. The patient understands the proposed procedure, potential alternative treatments, and the course of recovery to be expected. All of the patient's questions have been answered at this time. The patient wishes to proceed with surgery.   Darnell Levelodd Brylin Stanislawski, MD [facility] Office: [phone]

## 2017-07-01 ENCOUNTER — Encounter (HOSPITAL_COMMUNITY)
Admission: RE | Disposition: A | Discharge status: Home / Self Care | Payer: Self-pay | Source: Ambulatory Visit | Attending: Surgery

## 2017-07-01 ENCOUNTER — Other Ambulatory Visit: Payer: Self-pay

## 2017-07-01 ENCOUNTER — Encounter (HOSPITAL_COMMUNITY): Payer: Self-pay

## 2017-07-01 ENCOUNTER — Observation Stay (HOSPITAL_COMMUNITY)
Admission: RE | Admit: 2017-07-01 | Discharge: 2017-07-02 | Disposition: A | Discharge status: Home / Self Care | Payer: BLUE CROSS/BLUE SHIELD | Source: Ambulatory Visit | Attending: Surgery | Admitting: Surgery

## 2017-07-01 ENCOUNTER — Ambulatory Visit (HOSPITAL_COMMUNITY): Payer: BLUE CROSS/BLUE SHIELD | Admitting: Certified Registered Nurse Anesthetist

## 2017-07-01 DIAGNOSIS — G4700 Insomnia, unspecified: Secondary | ICD-10-CM | POA: Diagnosis not present

## 2017-07-01 DIAGNOSIS — F419 Anxiety disorder, unspecified: Secondary | ICD-10-CM | POA: Insufficient documentation

## 2017-07-01 DIAGNOSIS — E039 Hypothyroidism, unspecified: Secondary | ICD-10-CM | POA: Diagnosis not present

## 2017-07-01 DIAGNOSIS — E063 Autoimmune thyroiditis: Secondary | ICD-10-CM | POA: Diagnosis not present

## 2017-07-01 DIAGNOSIS — F17200 Nicotine dependence, unspecified, uncomplicated: Secondary | ICD-10-CM | POA: Insufficient documentation

## 2017-07-01 DIAGNOSIS — Z8249 Family history of ischemic heart disease and other diseases of the circulatory system: Secondary | ICD-10-CM | POA: Insufficient documentation

## 2017-07-01 DIAGNOSIS — Z79899 Other long term (current) drug therapy: Secondary | ICD-10-CM | POA: Insufficient documentation

## 2017-07-01 DIAGNOSIS — E049 Nontoxic goiter, unspecified: Secondary | ICD-10-CM | POA: Insufficient documentation

## 2017-07-01 DIAGNOSIS — K219 Gastro-esophageal reflux disease without esophagitis: Secondary | ICD-10-CM | POA: Diagnosis not present

## 2017-07-01 DIAGNOSIS — Z6841 Body Mass Index (BMI) 40.0 and over, adult: Secondary | ICD-10-CM | POA: Diagnosis not present

## 2017-07-01 DIAGNOSIS — R011 Cardiac murmur, unspecified: Secondary | ICD-10-CM | POA: Insufficient documentation

## 2017-07-01 HISTORY — PX: THYROIDECTOMY: SHX17

## 2017-07-01 LAB — PREGNANCY, URINE: Preg Test, Ur: NEGATIVE

## 2017-07-01 SURGERY — THYROIDECTOMY
Anesthesia: General | Site: Neck

## 2017-07-01 MED ORDER — TRAMADOL HCL 50 MG PO TABS
50.0000 mg | ORAL_TABLET | Freq: Four times a day (QID) | ORAL | Status: DC | PRN
  Administered 2017-07-01 (×2): 50 mg via ORAL
  Filled 2017-07-01 (×2): qty 1

## 2017-07-01 MED ORDER — HYDROMORPHONE HCL 1 MG/ML IJ SOLN: 1.0000 mg | INTRAMUSCULAR | Status: DC | PRN

## 2017-07-01 MED ORDER — MIDAZOLAM HCL 2 MG/2ML IJ SOLN
INTRAMUSCULAR | Status: AC
  Filled 2017-07-01: qty 2

## 2017-07-01 MED ORDER — PROMETHAZINE HCL 25 MG/ML IJ SOLN: 6.2500 mg | INTRAMUSCULAR | Status: DC | PRN

## 2017-07-01 MED ORDER — HYDROCODONE-ACETAMINOPHEN 5-325 MG PO TABS
1.0000 | ORAL_TABLET | ORAL | Status: DC | PRN
  Administered 2017-07-01 – 2017-07-02 (×2): 2 via ORAL
  Filled 2017-07-01 (×2): qty 2

## 2017-07-01 MED ORDER — MEPERIDINE HCL 50 MG/ML IJ SOLN: 6.2500 mg | INTRAMUSCULAR | Status: DC | PRN

## 2017-07-01 MED ORDER — CHLORHEXIDINE GLUCONATE CLOTH 2 % EX PADS: 6.0000 | MEDICATED_PAD | Freq: Once | CUTANEOUS | Status: DC

## 2017-07-01 MED ORDER — OXYCODONE HCL 5 MG PO TABS: 5.0000 mg | ORAL_TABLET | Freq: Once | ORAL | Status: DC | PRN

## 2017-07-01 MED ORDER — LACTATED RINGERS IV SOLN
INTRAVENOUS | Status: DC
  Administered 2017-07-01 (×2): via INTRAVENOUS

## 2017-07-01 MED ORDER — CALCIUM CARBONATE 1250 (500 CA) MG PO TABS
2.0000 | ORAL_TABLET | Freq: Three times a day (TID) | ORAL | Status: DC
  Administered 2017-07-01 – 2017-07-02 (×3): 1000 mg via ORAL
  Filled 2017-07-01 (×5): qty 1

## 2017-07-01 MED ORDER — LIDOCAINE 2% (20 MG/ML) 5 ML SYRINGE
INTRAMUSCULAR | Status: DC | PRN
  Administered 2017-07-01: 100 mg via INTRAVENOUS

## 2017-07-01 MED ORDER — ACETAMINOPHEN 325 MG PO TABS: 650.0000 mg | ORAL_TABLET | Freq: Four times a day (QID) | ORAL | Status: DC | PRN

## 2017-07-01 MED ORDER — CEFAZOLIN SODIUM-DEXTROSE 2-4 GM/100ML-% IV SOLN
INTRAVENOUS | Status: AC
  Filled 2017-07-01: qty 100

## 2017-07-01 MED ORDER — SCOPOLAMINE 1 MG/3DAYS TD PT72
MEDICATED_PATCH | TRANSDERMAL | Status: DC | PRN
  Administered 2017-07-01: 1 via TRANSDERMAL

## 2017-07-01 MED ORDER — LEVOTHYROXINE SODIUM 50 MCG PO TABS: 50.0000 ug | ORAL_TABLET | Freq: Every day | ORAL | Status: DC

## 2017-07-01 MED ORDER — 0.9 % SODIUM CHLORIDE (POUR BTL) OPTIME
TOPICAL | Status: DC | PRN
  Administered 2017-07-01: 1000 mL

## 2017-07-01 MED ORDER — KCL IN DEXTROSE-NACL 20-5-0.45 MEQ/L-%-% IV SOLN
INTRAVENOUS | Status: DC
  Administered 2017-07-01: 12:00:00 via INTRAVENOUS
  Filled 2017-07-01: qty 1000

## 2017-07-01 MED ORDER — ROCURONIUM BROMIDE 100 MG/10ML IV SOLN
INTRAVENOUS | Status: DC | PRN
  Administered 2017-07-01 (×2): 10 mg via INTRAVENOUS
  Administered 2017-07-01: 50 mg via INTRAVENOUS
  Administered 2017-07-01: 20 mg via INTRAVENOUS

## 2017-07-01 MED ORDER — FENTANYL CITRATE (PF) 250 MCG/5ML IJ SOLN
INTRAMUSCULAR | Status: AC
  Filled 2017-07-01: qty 5

## 2017-07-01 MED ORDER — ONDANSETRON 4 MG PO TBDP: 4.0000 mg | ORAL_TABLET | Freq: Four times a day (QID) | ORAL | Status: DC | PRN

## 2017-07-01 MED ORDER — LEVOTHYROXINE SODIUM 50 MCG PO TABS
250.0000 ug | ORAL_TABLET | Freq: Every day | ORAL | Status: DC
  Administered 2017-07-02: 250 ug via ORAL
  Filled 2017-07-01: qty 2

## 2017-07-01 MED ORDER — ACETAMINOPHEN 650 MG RE SUPP: 650.0000 mg | Freq: Four times a day (QID) | RECTAL | Status: DC | PRN

## 2017-07-01 MED ORDER — DEXAMETHASONE SODIUM PHOSPHATE 10 MG/ML IJ SOLN
INTRAMUSCULAR | Status: DC | PRN
  Administered 2017-07-01: 10 mg via INTRAVENOUS

## 2017-07-01 MED ORDER — DEXAMETHASONE SODIUM PHOSPHATE 10 MG/ML IJ SOLN
INTRAMUSCULAR | Status: AC
  Filled 2017-07-01: qty 1

## 2017-07-01 MED ORDER — FENTANYL CITRATE (PF) 250 MCG/5ML IJ SOLN
INTRAMUSCULAR | Status: DC | PRN
  Administered 2017-07-01: 100 ug via INTRAVENOUS
  Administered 2017-07-01: 50 ug via INTRAVENOUS
  Administered 2017-07-01: 100 ug via INTRAVENOUS

## 2017-07-01 MED ORDER — ONDANSETRON HCL 4 MG/2ML IJ SOLN
INTRAMUSCULAR | Status: DC | PRN
  Administered 2017-07-01: 4 mg via INTRAVENOUS

## 2017-07-01 MED ORDER — HYDROMORPHONE HCL 1 MG/ML IJ SOLN
0.2500 mg | INTRAMUSCULAR | Status: DC | PRN
  Administered 2017-07-01 (×2): 0.25 mg via INTRAVENOUS
  Administered 2017-07-01: 0.5 mg via INTRAVENOUS

## 2017-07-01 MED ORDER — SUGAMMADEX SODIUM 500 MG/5ML IV SOLN
INTRAVENOUS | Status: AC
  Filled 2017-07-01: qty 5

## 2017-07-01 MED ORDER — HYDROMORPHONE HCL 1 MG/ML IJ SOLN
INTRAMUSCULAR | Status: AC
  Filled 2017-07-01: qty 1

## 2017-07-01 MED ORDER — LIDOCAINE 2% (20 MG/ML) 5 ML SYRINGE
INTRAMUSCULAR | Status: AC
  Filled 2017-07-01: qty 5

## 2017-07-01 MED ORDER — SCOPOLAMINE 1 MG/3DAYS TD PT72
MEDICATED_PATCH | TRANSDERMAL | Status: AC
  Filled 2017-07-01: qty 1

## 2017-07-01 MED ORDER — OXYCODONE HCL 5 MG/5ML PO SOLN: 5.0000 mg | Freq: Once | ORAL | Status: DC | PRN

## 2017-07-01 MED ORDER — LEVOTHYROXINE SODIUM 100 MCG PO TABS: 200.0000 ug | ORAL_TABLET | Freq: Every day | ORAL | Status: DC

## 2017-07-01 MED ORDER — ONDANSETRON HCL 4 MG/2ML IJ SOLN: 4.0000 mg | Freq: Four times a day (QID) | INTRAMUSCULAR | Status: DC | PRN

## 2017-07-01 MED ORDER — ESCITALOPRAM OXALATE 10 MG PO TABS
10.0000 mg | ORAL_TABLET | Freq: Every day | ORAL | Status: DC
  Administered 2017-07-02: 10 mg via ORAL
  Filled 2017-07-01: qty 1

## 2017-07-01 MED ORDER — SUGAMMADEX SODIUM 500 MG/5ML IV SOLN
INTRAVENOUS | Status: DC | PRN
  Administered 2017-07-01: 400 mg via INTRAVENOUS

## 2017-07-01 MED ORDER — MIDAZOLAM HCL 5 MG/5ML IJ SOLN
INTRAMUSCULAR | Status: DC | PRN
  Administered 2017-07-01: 2 mg via INTRAVENOUS

## 2017-07-01 MED ORDER — ROCURONIUM BROMIDE 100 MG/10ML IV SOLN
INTRAVENOUS | Status: AC
  Filled 2017-07-01: qty 1

## 2017-07-01 MED ORDER — PROPOFOL 10 MG/ML IV BOLUS
INTRAVENOUS | Status: AC
  Filled 2017-07-01: qty 40

## 2017-07-01 SURGICAL SUPPLY — 36 items
ATTRACTOMAT 16X20 MAGNETIC DRP (DRAPES) ×2 IMPLANT
BLADE SURG 15 STRL LF DISP TIS (BLADE) ×1 IMPLANT
BLADE SURG 15 STRL SS (BLADE) ×1
CHLORAPREP W/TINT 26ML (MISCELLANEOUS) ×4 IMPLANT
CLIP VESOCCLUDE MED 6/CT (CLIP) ×6 IMPLANT
CLIP VESOCCLUDE SM WIDE 6/CT (CLIP) ×6 IMPLANT
CLOSURE STERI-STRIP 1/4X4 (GAUZE/BANDAGES/DRESSINGS) ×2 IMPLANT
COVER SURGICAL LIGHT HANDLE (MISCELLANEOUS) ×2 IMPLANT
DISSECTOR ROUND CHERRY 3/8 STR (MISCELLANEOUS) IMPLANT
DRAPE LAPAROTOMY T 98X78 PEDS (DRAPES) ×2 IMPLANT
ELECT PENCIL ROCKER SW 15FT (MISCELLANEOUS) ×2 IMPLANT
ELECT REM PT RETURN 15FT ADLT (MISCELLANEOUS) ×2 IMPLANT
GAUZE 4X4 16PLY RFD (DISPOSABLE) ×2 IMPLANT
GAUZE SPONGE 4X4 12PLY STRL (GAUZE/BANDAGES/DRESSINGS) ×2 IMPLANT
GLOVE SURG ORTHO 8.0 STRL STRW (GLOVE) ×2 IMPLANT
GOWN STRL REUS W/TWL XL LVL3 (GOWN DISPOSABLE) ×4 IMPLANT
HEMOSTAT SURGICEL 2X4 FIBR (HEMOSTASIS) ×2 IMPLANT
ILLUMINATOR WAVEGUIDE N/F (MISCELLANEOUS) ×2 IMPLANT
KIT BASIN OR (CUSTOM PROCEDURE TRAY) ×2 IMPLANT
LIGHT WAVEGUIDE WIDE FLAT (MISCELLANEOUS) IMPLANT
PACK BASIC VI WITH GOWN DISP (CUSTOM PROCEDURE TRAY) ×2 IMPLANT
POWDER SURGICEL 3.0 GRAM (HEMOSTASIS) IMPLANT
SHEARS HARMONIC 9CM CVD (BLADE) ×2 IMPLANT
STAPLER VISISTAT 35W (STAPLE) IMPLANT
STRIP CLOSURE SKIN 1/2X4 (GAUZE/BANDAGES/DRESSINGS) ×2 IMPLANT
SUT MNCRL AB 4-0 PS2 18 (SUTURE) ×2 IMPLANT
SUT SILK 2 0 (SUTURE)
SUT SILK 2-0 18XBRD TIE 12 (SUTURE) IMPLANT
SUT SILK 3 0 (SUTURE)
SUT SILK 3-0 18XBRD TIE 12 (SUTURE) IMPLANT
SUT VIC AB 3-0 SH 18 (SUTURE) ×4 IMPLANT
SYR BULB IRRIGATION 50ML (SYRINGE) ×2 IMPLANT
TAPE CLOTH SURG 4X10 WHT LF (GAUZE/BANDAGES/DRESSINGS) ×2 IMPLANT
TOWEL OR 17X26 10 PK STRL BLUE (TOWEL DISPOSABLE) ×2 IMPLANT
TOWEL OR NON WOVEN STRL DISP B (DISPOSABLE) ×2 IMPLANT
YANKAUER SUCT BULB TIP 10FT TU (MISCELLANEOUS) ×2 IMPLANT

## 2017-07-01 NOTE — Interval H&P Note (Signed)
History and Physical Interval Note:  07/01/2017 7:05 AM  Janet [name]  has presented today for surgery, with the diagnosis of Hashimoto's thyroiditis, thyroid goiter.  The various methods of treatment have been discussed with the patient and family. After consideration of risks, benefits and other options for treatment, the patient has consented to    Procedure(s): TOTAL THYROIDECTOMY (N/A) as a surgical intervention .    The patient's history has been reviewed, patient examined, no change in status, stable for surgery.  I have reviewed the patient's chart and labs.  Questions were answered to the patient's satisfaction.    Darnell Levelodd Azharia Surratt, MD [facility] Office: [phone]    [name] MJudie Petit

## 2017-07-01 NOTE — Anesthesia Postprocedure Evaluation (Signed)
Anesthesia Post Note  Patient: Janet [name]  Procedure(s) Performed: TOTAL THYROIDECTOMY (N/A Neck)     Anesthesia Post Evaluation  Last Vitals:  Vitals:   07/01/17 0536  BP: (!) 154/90  Pulse: 72  Resp: 16  Temp: 36.8 C  SpO2: 100%    Last Pain:  Vitals:   07/01/17 0604  TempSrc:   PainSc: 0-No pain                 Clydene PughStephanie C [place]

## 2017-07-01 NOTE — Plan of Care (Signed)
Plan of care discussed with patient and parents.

## 2017-07-01 NOTE — Transfer of Care (Signed)
Immediate Anesthesia Transfer of Care Note  Patient: Janet [name]  Procedure(s) Performed: TOTAL THYROIDECTOMY (N/A Neck)  Patient Location: PACU  Anesthesia Type:General  Level of Consciousness: awake, alert  and oriented  Airway & Oxygen Therapy: Patient Spontanous Breathing and Patient connected to face mask oxygen  Post-op Assessment: Report given to RN and Post -op Vital signs reviewed and stable  Post vital signs: Reviewed and stable  Last Vitals:  Vitals Value Taken Time  BP    Temp    Pulse 81 07/01/2017  9:36 AM  Resp 14 07/01/2017  9:36 AM  SpO2 100 % 07/01/2017  9:36 AM  Vitals shown include unvalidated device data.  Last Pain:  Vitals:   07/01/17 0604  TempSrc:   PainSc: 0-No pain         Complications: No apparent anesthesia complications

## 2017-07-01 NOTE — Anesthesia Procedure Notes (Signed)
Procedure Name: Intubation Date/Time: 07/01/2017 7:31 AM Performed by: [place] Jennice Renegar C, CRNA Pre-anesthesia Checklist: Patient identified, Emergency Drugs available, Suction available and Patient being monitored Patient Re-evaluated:Patient Re-evaluated prior to induction Oxygen Delivery Method: Circle system utilized Preoxygenation: Pre-oxygenation with 100% oxygen Induction Type: IV induction Ventilation: Mask ventilation without difficulty Laryngoscope Size: Mac and 3 Grade View: Grade I Tube type: Oral Tube size: 7.0 mm Number of attempts: 1 Airway Equipment and Method: Stylet and Oral airway Placement Confirmation: ETT inserted through vocal cords under direct vision,  positive ETCO2 and breath sounds checked- equal and bilateral Secured at: 21 cm Tube secured with: Tape Dental Injury: Teeth and Oropharynx as per pre-operative assessment

## 2017-07-01 NOTE — Op Note (Signed)
Procedure Note  Pre-operative Diagnosis:  Hashimoto's thyroiditis, hypothyroidism, enlarged thyroid  Post-operative Diagnosis:  same  Surgeon:  Darnell Level, MD  Assistant:  none   Procedure:  Total thyroidectomy  Anesthesia:  General  Estimated Blood Loss:  minimal  Drains: none         Specimen: thyroid to pathology  Indications:  Patient is referred by Dr. Carlus Pavlov for surgical evaluation and management of Hashimoto's thyroiditis with thyroid goiter with compressive symptoms. Patient has been symptomatic for 2-3 years. She is now taking levothyroxine. She is currently taking 250 g daily. Most recent TSH level remained elevated at 9.04. Patient last had an ultrasound examination in 2015. This showed an enlarged thyroid gland without nodules. There was heterogeneous parenchyma consistent with thyroiditis. Laboratory studies confirmed Hashimoto's thyroiditis. Patient complains of multiple compressive symptoms including solid food dysphagia, alterations in voice quality, right sided neck pain, and globus sensation. Patient has had no prior head or neck surgery other than tonsillectomy.  Procedure Details: Procedure was done in OR #4 at the [facility].  The patient was brought to the operating room and placed in a supine position on the operating room table.  Following administration of general anesthesia, the patient was positioned and then prepped and draped in the usual aseptic fashion.  After ascertaining that an adequate level of anesthesia had been achieved, a Kocher incision was made with #15 blade.  Dissection was carried through subcutaneous tissues and platysma. Hemostasis was achieved with the electrocautery.  Skin flaps were elevated cephalad and caudad from the thyroid notch to the sternal notch.  The Mahorner self-retaining retractor was placed for exposure.  Strap muscles were incised in the midline and dissection was begun on the left side.  Strap  muscles were reflected laterally.  Left thyroid lobe was mildly enlarged and very firm.  The left lobe was gently mobilized with blunt dissection.  Superior pole vessels were dissected out and divided individually between small and medium Ligaclips with the Harmonic scalpel.  The thyroid lobe was rolled anteriorly.  Branches of the inferior thyroid artery were divided between small Ligaclips with the Harmonic scalpel.  Inferior venous tributaries were divided between Ligaclips.  Both the superior and inferior parathyroid glands were identified and preserved on their vascular pedicles.  The recurrent laryngeal nerve was identified and preserved along its course.  The ligament of Allyson Sabal was released with the electrocautery and the gland was mobilized onto the anterior trachea. Isthmus was mobilized across the midline.  There was a moderate sized pyramidal lobe present which was dissected off of the thyroid cartilage and resected with the isthmus.  Dry pack was placed in the left neck.  Next, the right thyroid lobe was gently mobilized with blunt dissection.  Right thyroid lobe was mildly enlarged and quite firm.  Superior pole vessels were dissected out and divided between small and medium Ligaclips with the Harmonic scalpel.  Superior parathyroid was identified and preserved.  Inferior venous tributaries were divided between medium Ligaclips with the Harmonic scalpel.  The right thyroid lobe was rolled anteriorly and the branches of the inferior thyroid artery divided between small Ligaclips.  The right recurrent laryngeal nerve was identified and preserved along its course.  The ligament of Allyson Sabal was released with the electrocautery.  The right thyroid lobe was mobilized onto the anterior trachea and the remainder of the thyroid was dissected off the anterior trachea and the thyroid was completely excised.  A suture was used to mark the  left lobe. The entire thyroid gland was submitted to pathology for  review.  The neck was irrigated with warm saline.  Fibrillar was placed throughout the operative field.  Strap muscles were reapproximated in the midline with interrupted 3-0 Vicryl sutures.  Platysma was closed with interrupted 3-0 Vicryl sutures.  Skin was closed with a running 4-0 Monocryl subcuticular suture.  Wound was washed and dried and steri-strips were applied.  Dry gauze dressing was placed.  The patient was awakened from anesthesia and brought to the recovery room.  The patient tolerated the procedure well.   Darnell Levelodd Parley Pidcock, MD [facility] Office: [phone]

## 2017-07-01 NOTE — Anesthesia Preprocedure Evaluation (Signed)
Anesthesia Evaluation  Patient identified by MRN, date of birth, ID band Patient awake    Reviewed: Allergy & Precautions, NPO status , Patient's Chart, lab work & pertinent test results  Airway Mallampati: II  TM Distance: >3 FB Neck ROM: Full    Dental no notable dental hx.    Pulmonary neg pulmonary ROS, Current Smoker,    Pulmonary exam normal breath sounds clear to auscultation       Cardiovascular negative cardio ROS Normal cardiovascular exam Rhythm:Regular Rate:Normal     Neuro/Psych  Headaches, Anxiety negative neurological ROS  negative psych ROS   GI/Hepatic negative GI ROS, Neg liver ROS, GERD  ,  Endo/Other  negative endocrine ROSHypothyroidism Morbid obesity  Renal/GU negative Renal ROS  negative genitourinary   Musculoskeletal negative musculoskeletal ROS (+)   Abdominal   Peds negative pediatric ROS (+)  Hematology negative hematology ROS (+)   Anesthesia Other Findings   Reproductive/Obstetrics negative OB ROS                             Anesthesia Physical Anesthesia Plan  ASA: III  Anesthesia Plan: General   Post-op Pain Management:    Induction: Intravenous  PONV Risk Score and Plan: 2 and Ondansetron and Midazolam  Airway Management Planned: Oral ETT  Additional Equipment:   Intra-op Plan:   Post-operative Plan: Extubation in OR  Informed Consent: I have reviewed the patients History and Physical, chart, labs and discussed the procedure including the risks, benefits and alternatives for the proposed anesthesia with the patient or authorized representative who has indicated his/her understanding and acceptance.   Dental advisory given  Plan Discussed with: CRNA  Anesthesia Plan Comments:         Anesthesia Quick Evaluation

## 2017-07-02 DIAGNOSIS — F17200 Nicotine dependence, unspecified, uncomplicated: Secondary | ICD-10-CM | POA: Diagnosis not present

## 2017-07-02 DIAGNOSIS — F419 Anxiety disorder, unspecified: Secondary | ICD-10-CM | POA: Diagnosis not present

## 2017-07-02 DIAGNOSIS — R011 Cardiac murmur, unspecified: Secondary | ICD-10-CM | POA: Diagnosis not present

## 2017-07-02 DIAGNOSIS — E063 Autoimmune thyroiditis: Secondary | ICD-10-CM | POA: Diagnosis not present

## 2017-07-02 LAB — BASIC METABOLIC PANEL
Anion gap: 8 (ref 5–15)
BUN: 8 mg/dL (ref 6–20)
CO2: 26 mmol/L (ref 22–32)
Calcium: 8.7 mg/dL — ABNORMAL LOW (ref 8.9–10.3)
Chloride: 104 mmol/L (ref 98–111)
Creatinine, Ser: 0.7 mg/dL (ref 0.44–1.00)
GFR calc Af Amer: >60 mL/min (ref >60)
GFR calc non Af Amer: >60 mL/min (ref >60)
Glucose, Bld: 119 mg/dL — ABNORMAL HIGH (ref 70–99)
Potassium: 4.1 mmol/L (ref 3.5–5.1)
Sodium: 138 mmol/L (ref 135–145)

## 2017-07-02 MED ORDER — CALCIUM CARBONATE ANTACID 500 MG PO CHEW: 2.0000 | CHEWABLE_TABLET | Freq: Two times a day (BID) | ORAL | 1 refills | Status: DC

## 2017-07-02 MED ORDER — TRAMADOL HCL 50 MG PO TABS: 50.0000 mg | ORAL_TABLET | Freq: Four times a day (QID) | ORAL | 0 refills | Status: DC | PRN

## 2017-07-02 NOTE — Discharge Summary (Signed)
Physician Discharge Summary [facility]  Patient ID: Janet [name] MRN: [id number] DOB/AGE: 23/08/1995 23 y.o.  Admit date: 07/01/2017 Discharge date: 07/02/2017  Admission Diagnoses:  Hashimoto's thyroiditis, hypothyroidism, enlarged thyroid  Discharge Diagnoses:  Principal Problem:   Enlarged thyroid Active Problems:   Hashimoto's thyroiditis   Hypothyroidism   Discharged Condition: good  Hospital Course: Patient was admitted for observation following thyroid surgery.  Post op course was uncomplicated.  Pain was well controlled.  Tolerated diet.  Post op calcium level on morning following surgery was 8.7 mg/dl.  Patient was prepared for discharge home on POD#1.  Consults: None  Treatments: surgery: total thyroidectomy  Discharge Exam: Blood pressure 127/72, pulse 63, temperature 98.7 F (37.1 C), resp. rate 17, height 5\' 7"  (1.702 m), weight 130.2 kg (287 lb), last menstrual period 06/18/2017, SpO2 98 %. HEENT - clear Neck - wound dry and intact; minimal STS; voice normal Chest - clear bilaterally Cor - RRR  Disposition: Home  Discharge Instructions    Diet - low sodium heart healthy   Complete by:  As directed    Discharge instructions   Complete by:  As directed    [facility]  THYROID & PARATHYROID SURGERY:  POST-OP INSTRUCTIONS  Always review your discharge instruction sheet from the facility where your surgery was performed.  A prescription for pain medication may be given to you upon discharge.  Take your pain medication as prescribed.  If narcotic pain medicine is not needed, then you may take acetaminophen (Tylenol) or ibuprofen (Advil) as needed.  Take your usually prescribed medications unless otherwise directed.  If you need a refill on your pain medication, please contact our office during regular business hours.  Prescriptions cannot be processed by our office after 5 pm or on weekends.  Start with a light  diet upon arrival home, such as soup and crackers or toast.  Be sure to drink plenty of fluids daily.  Resume your normal diet the day after surgery.  Most patients will experience some swelling and bruising on the chest and neck area.  Ice packs will help.  Swelling and bruising can take several days to resolve.   It is common to experience some constipation after surgery.  Increasing fluid intake and taking a stool softener (Colace) will usually help or prevent this problem.  A mild laxative (Milk of Magnesia or Miralax) should be taken according to package directions if there has been no bowel movement after 48 hours.  You have steri-strips and a gauze dressing over your incision.  You may remove the gauze bandage on the second day after surgery, and you may shower at that time.  Leave your steri-strips (small skin tapes) in place directly over the incision.  These strips should remain on the skin for 5-7 days and then be removed.  You may get them wet in the shower and pat them dry.  You may resume regular (light) daily activities beginning the next day (such as daily self-care, walking, climbing stairs) gradually increasing activities as tolerated.  You may have sexual intercourse when it is comfortable.  Refrain from any heavy lifting or straining until approved by your doctor.  You may drive when you no longer are taking prescription pain medication, you can comfortably wear a seatbelt, and you can safely maneuver your car and apply brakes.  You should see your doctor in the office for a follow-up appointment approximately three weeks after your surgery.  Make sure that you call for this appointment within a day or two after you arrive home to insure a convenient appointment time.  WHEN TO CALL YOUR DOCTOR: -- Fever greater than 101.5 -- Inability to urinate -- Nausea and/or vomiting - persistent -- Extreme swelling or bruising -- Continued bleeding from incision -- Increased pain, redness,  or drainage from the incision -- Difficulty swallowing or breathing -- Muscle cramping or spasms -- Numbness or tingling in hands or around lips  The clinic staff is available to answer your questions during regular business hours.  Please don't hesitate to call and ask to speak to one of the nurses if you have concerns.  Darnell Level, MD [facility] Office: [phone]   Increase activity slowly   Complete by:  As directed    Remove dressing in 24 hours   Complete by:  As directed      Allergies as of 07/02/2017   No Known Allergies     Medication List    TAKE these medications   acetaminophen 500 MG tablet Commonly known as:  TYLENOL Take 1,000 mg by mouth every 8 (eight) hours as needed (for pain/headaches.).   MIDOL 650 MG CR tablet Generic drug:  acetaminophen Take 650 mg by mouth every 8 (eight) hours as needed (for menstrual pain/cramps).   calcium carbonate 500 MG chewable tablet Commonly known as:  TUMS Chew 2 tablets (400 mg of elemental calcium total) by mouth 2 (two) times daily.   escitalopram 10 MG tablet Commonly known as:  LEXAPRO Take 1 tablet (10 mg total) by mouth daily.   ibuprofen 200 MG tablet Commonly known as:  ADVIL,MOTRIN Take 600-800 mg by mouth every 8 (eight) hours as needed (for pain/headache).   levothyroxine 200 MCG tablet Commonly known as:  SYNTHROID, LEVOTHROID Take 1 tablet daily in am. Total daily dose 250 mcg daily.   levothyroxine 50 MCG tablet Commonly known as:  SYNTHROID, LEVOTHROID Take 1 tablet (50 mcg total) by mouth daily. Total daily dose 250 mcg daily.   traMADol 50 MG tablet Commonly known as:  ULTRAM Take 1 tablet (50 mg total) by mouth every 6 (six) hours as needed for moderate pain (mild pain).      Follow-up Information    Darnell Level, MD. Schedule an appointment as soon as possible for a visit in 3 week(s).   Specialty:  General Surgery Contact information: [address] [place] [address] [phone]           Velora Heckler, MD, [facility] Office: [phone]   Signed: Velora Heckler 07/02/2017, 7:21 AM

## 2017-07-18 ENCOUNTER — Encounter: Payer: Self-pay | Admitting: Family Medicine

## 2017-07-18 ENCOUNTER — Ambulatory Visit: Payer: BLUE CROSS/BLUE SHIELD | Admitting: Family Medicine

## 2017-07-18 VITALS — BP 135/88 | HR 77 | Temp 97.8°F | Ht 67.0 in | Wt 292.0 lb

## 2017-07-18 DIAGNOSIS — H938X1 Other specified disorders of right ear: Secondary | ICD-10-CM | POA: Diagnosis not present

## 2017-07-18 DIAGNOSIS — E049 Nontoxic goiter, unspecified: Secondary | ICD-10-CM | POA: Diagnosis not present

## 2017-07-18 DIAGNOSIS — E063 Autoimmune thyroiditis: Secondary | ICD-10-CM

## 2017-07-18 DIAGNOSIS — E038 Other specified hypothyroidism: Secondary | ICD-10-CM | POA: Diagnosis not present

## 2017-07-18 DIAGNOSIS — F5101 Primary insomnia: Secondary | ICD-10-CM | POA: Diagnosis not present

## 2017-07-18 DIAGNOSIS — F411 Generalized anxiety disorder: Secondary | ICD-10-CM | POA: Diagnosis not present

## 2017-07-18 MED ORDER — ESCITALOPRAM OXALATE 20 MG PO TABS: 20.0000 mg | ORAL_TABLET | Freq: Every day | ORAL | 1 refills | Status: DC

## 2017-07-18 MED ORDER — TRAZODONE HCL 50 MG PO TABS: 25.0000 mg | ORAL_TABLET | Freq: Every evening | ORAL | 3 refills | Status: DC | PRN

## 2017-07-18 NOTE — Assessment & Plan Note (Signed)
Trazodone added for sleep as needed.  Follow-up in 4 to 6 weeks.

## 2017-07-18 NOTE — Assessment & Plan Note (Signed)
Improving on Lexapro but still not totally controlled.  Increase Lexapro to 20 mg daily.  I also added trazodone 25 to 50 mg nightly as needed insomnia.  She will follow-up in 4 to 6 weeks or sooner if needed.

## 2017-07-18 NOTE — Progress Notes (Signed)
Subjective: CC: Hashimoto's thyroid dz, GAD HPI: Janet [name] is a 22 y.o. female presenting to clinic today for:  1. Anxiety History: Long-standing history of anxiety and panic w/ somatic manifestations.  No history of hospitalizations for mental health disorder.  Past medical history significant for congenital heart murmur that resolved and Hashimoto's thyroiditis.  Family history significant for anxiety in her mother.    She was seen at the end of May for generalized anxiety disorder and to establish care.  Her gad 7 score was 15.  We discussed at that point that the thyroid disease may be contributing to anxiety symptoms but she wished to proceed with initiation of medication.  Lexapro 10 mg daily was started.  She presents today for follow-up and notes that symptoms of irritability have decreased but still occur.  She is overall she is been feeling quite a bit better on the Lexapro 10 mg daily.  She is tolerating medication without difficulty.  She does note that sleep is a little bit harder and she feels like this is been since starting the medication.  She cites that some days she is able to go to sleep without difficulty but other days she will be up until 3:00 in the morning before she is able to fall asleep.  2. Hashimoto's disease Patient s/p thyroidectomy on 07/01/2017.  She reports that she has been doing well after thyroidectomy.  She has orders to recheck calcium today.  She plans to see her endocrinologist in about a week.  Overall feeling well, no fevers, chills or difficulty swallowing.  She does report that her voice goes in and out occasionally.  3.  Right ear fullness Patient reports that she has had bilateral ear fullness with right being worse than left over the last several months.  She notes some yellowish discharge which she thinks is wax.  No overt pain, fevers.  No trauma to the ear.  No recent swimming.    Past Medical History:  Diagnosis Date  . Allergy   .  Anxiety   . GERD (gastroesophageal reflux disease)   . Headache(784.0)   . 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  . Thyroid disease    Hashimotos    Past Surgical History:  Procedure Laterality Date  . THYROIDECTOMY N/A 07/01/2017   Procedure: TOTAL THYROIDECTOMY;  Surgeon: Darnell Level, MD;  Location: [facility];  Service: General;  Laterality: N/A;  . TONSILLECTOMY AND ADENOIDECTOMY Bilateral 01/2011  . TOTAL THYROIDECTOMY     Dr. Gerrit Friends 07-01-17   Social History   Socioeconomic History  . Marital status: Single    Spouse name: Not on file  . Number of children: Not on file  . Years of education: Not on file  . Highest education level: Not on file  Occupational History  . Occupation: Conservation officer, [employer]    Comment: Clinical biochemist  Social Needs  . Financial resource strain: Not on file  . Food insecurity:    Worry: Not on file    Inability: Not on file  . Transportation needs:    Medical: Not on file    Non-medical: Not on file  Tobacco Use  . Smoking status: Current Some Day Smoker    Packs/day: 0.50    Years: 2.00    Pack years: 1.00    Types: Cigarettes  . Smokeless tobacco: Never Used  Substance and Sexual Activity  . Alcohol use: Not Currently    Comment: rare  . Drug use: No  .  Sexual activity: Never    Birth control/protection: None  Lifestyle  . Physical activity:    Days per week: Not on file    Minutes per session: Not on file  . Stress: Not on file  Relationships  . Social connections:    Talks on phone: Not on file    Gets together: Not on file    Attends religious service: Not on file    Active member of club or organization: Not on file    Attends meetings of clubs or organizations: Not on file    Relationship status: Not on file  . Intimate partner violence:    Fear of current or ex partner: Not on file    Emotionally abused: Not on file    Physically abused: Not on file    Forced sexual activity: Not on file  Other Topics Concern  . Not on  file  Social History Narrative  . Not on file   No outpatient medications have been marked as taking for the 07/18/17 encounter (Appointment) with Raliegh Ip, DO.   Family History  Problem Relation Age of Onset  . Heart attack Maternal Grandmother   . Heart Problems Maternal Grandfather   . Heart attack Maternal Grandfather   . Cancer Paternal Grandmother   . Cancer Paternal Grandfather   . Diabetes Mother   . Other Mother        gastropresis  . Anxiety disorder Mother   . Other Father        syncope-has a pacemaker  . COPD Father    No Known Allergies   Health Maintenance: Pap needed  Objective: Office vital signs reviewed. BP 135/88   Pulse 77   Temp 97.8 F (36.6 C) (Oral)   Ht 5\' 7"  (1.702 m)   Wt 292 lb (132.5 kg)   LMP 06/18/2017 (Exact Date) Comment: negative urine pregnancy, 07/01/17  BMI 45.73 kg/m   Physical Examination:  General: Awake, alert, obese, No acute distress HEENT: Normal    Neck: No lymphadenopathy; large surgical scar appreciated the base of the neck.  No evidence of secondary infection.    Eyes: PERRLA, extraocular movement in tact, sclera white; no exophthalmos    Ears: Bilateral tympanic membranes intact.  Normal light reflex.  No bulging.  No erythema.    Throat: moist mucus membranes Cardio: regular rate and rhythm, S1S2 heard, no murmurs appreciated Pulm: clear to auscultation bilaterally, no wheezes, rhonchi or rales; normal work of breathing on room air Neuro: no focal neurologic deficits, no resting tremor Psych: Mood stable, speech normal, affect appropriate  Depression screen [facility] 2/9 07/18/2017 06/05/2017 12/07/2014  Decreased Interest 1 0 0  Down, Depressed, Hopeless 0 0 0  PHQ - 2 Score 1 0 0   GAD 7 : Generalized Anxiety Score 07/18/2017 06/05/2017  Nervous, Anxious, on Edge 0 1  Control/stop worrying 2 3  Worry too much - different things 2 3  Trouble relaxing 2 2  Restless 0 1  Easily annoyed or irritable 2 3    Afraid - awful might happen 0 2  Total GAD 7 Score 8 15  Anxiety Difficulty Somewhat difficult -    Assessment/ Plan: 22 y.o. female   GAD (generalized anxiety disorder) Improving on Lexapro but still not totally controlled.  Increase Lexapro to 20 mg daily.  I also added trazodone 25 to 50 mg nightly as needed insomnia.  She will follow-up in 4 to 6 weeks or sooner if needed.  Insomnia,  unspecified Trazodone added for sleep as needed.  Follow-up in 4 to 6 weeks.  Hypothyroidism Doing well status post thyroidectomy.  She has follow-up with her endocrinologist next week.  She will get calcium checked today.  Postsurgical incision looks clean.  Ear fullness, right No evidence of infection or trauma to the ear.  I suspect this is fluid secondary to allergies.  Start Zyrtec 10 mg daily.  Caution sedation.  Follow-up as needed.   Raliegh IpAshly M Kein Carlberg, DO [facility] [phone]

## 2017-07-18 NOTE — Patient Instructions (Signed)
Start Zyrtec every night at bedtime for allergies.  This will help with the fullness feeling in your ears.  If you find that you are still having difficulty sleeping, you may use trazodone 1/2 tablet to 1 full tablet at bedtime.  Uses only as needed.  Make sure that you have full 8 hours of sleep.  I have increased the Lexapro to 20 mg daily.  Follow-up with me in 4 to 6 weeks or sooner if needed.  Taking the medicine as directed and not missing any doses is one of the best things you can do to treat your symptoms.  Here are some things to keep in mind:  1) Side effects (stomach upset, some increased anxiety) may happen before you notice a benefit.  These side effects typically go away over time. 2) Changes to your dose of medicine or a change in medication all together is sometimes necessary 3) Most people need to be on medication at least 12 months 4) Many people will notice an improvement within two weeks but the full effect of the medication can take up to 4-6 weeks 5) Stopping the medication when you start feeling better often results in a return of symptoms 6) Never discontinue your medication without contacting a health care professional first.  Some medications require gradual discontinuation/ taper and can make you sick if you stop them abruptly.  If your symptoms worsen or you have thoughts of suicide/homicide, PLEASE SEEK IMMEDIATE MEDICAL ATTENTION.  You may always call:  National Suicide Hotline: 210-309-4057626-420-3875 [place] Crisis Line: 647-754-7431347-041-3292 [place]: [phone]   These are available 24 hours a day, 7 days a week.

## 2017-07-18 NOTE — Assessment & Plan Note (Signed)
Doing well status post thyroidectomy.  She has follow-up with her endocrinologist next week.  She will get calcium checked today.  Postsurgical incision looks clean.

## 2017-07-29 ENCOUNTER — Other Ambulatory Visit: Payer: Self-pay | Admitting: Family Medicine

## 2017-08-19 ENCOUNTER — Encounter: Payer: Self-pay | Admitting: Family Medicine

## 2017-08-19 ENCOUNTER — Ambulatory Visit: Payer: BLUE CROSS/BLUE SHIELD | Admitting: Family Medicine

## 2017-08-19 VITALS — BP 121/84 | HR 72 | Temp 98.3°F | Ht 67.0 in | Wt 295.8 lb

## 2017-08-19 DIAGNOSIS — F5101 Primary insomnia: Secondary | ICD-10-CM | POA: Diagnosis not present

## 2017-08-19 DIAGNOSIS — R1011 Right upper quadrant pain: Secondary | ICD-10-CM | POA: Diagnosis not present

## 2017-08-19 DIAGNOSIS — F411 Generalized anxiety disorder: Secondary | ICD-10-CM | POA: Diagnosis not present

## 2017-08-19 MED ORDER — ESCITALOPRAM OXALATE 20 MG PO TABS: 20.0000 mg | ORAL_TABLET | Freq: Every day | ORAL | 1 refills | Status: DC

## 2017-08-19 NOTE — Patient Instructions (Addendum)
I placed the referral to gastroenterology for reevaluation of your symptoms.  I have sent in a 3625-month supply of your Lexapro.  Your trazodone was sent to [facility], you may need to ask them to refill this.  Follow-up with me when able for your physical w/ Pap smear and in 3 to 6 months for recheck of depression and sleep.  Low-Fat Diet for Pancreatitis or Gallbladder Conditions A low-fat diet can be helpful if you have pancreatitis or a gallbladder condition. With these conditions, your pancreas and gallbladder have trouble digesting fats. A healthy eating plan with less fat will help rest your pancreas and gallbladder and reduce your symptoms. What do I need to know about this diet?  Eat a low-fat diet. ? Reduce your fat intake to less than 20-30% of your total daily calories. This is less than 50-60 g of fat per day. ? Remember that you need some fat in your diet. Ask your dietician what your daily goal should be. ? Choose nonfat and low-fat healthy foods. Look for the words "nonfat," "low fat," or "fat free." ? As a guide, look on the label and choose foods with less than 3 g of fat per serving. Eat only one serving.  Avoid alcohol.  Do not smoke. If you need help quitting, talk with your health care provider.  Eat small frequent meals instead of three large heavy meals. What foods can I eat? Grains Include healthy grains and starches such as potatoes, wheat bread, fiber-rich cereal, and brown rice. Choose whole grain options whenever possible. In adults, whole grains should account for 45-65% of your daily calories. Fruits and Vegetables Eat plenty of fruits and vegetables. Fresh fruits and vegetables add fiber to your diet. Meats and Other Protein Sources Eat lean meat such as chicken and pork. Trim any fat off of meat before cooking it. Eggs, fish, and beans are other sources of protein. In adults, these foods should account for 10-35% of your daily calories. Dairy Choose low-fat milk and  dairy options. Dairy includes fat and protein, as well as calcium. Fats and Oils Limit high-fat foods such as fried foods, sweets, baked goods, sugary drinks. Other Creamy sauces and condiments, such as mayonnaise, can add extra fat. Think about whether or not you need to use them, or use smaller amounts or low fat options. What foods are not recommended?  High fat foods, such as: ? [employer]oro CorporationBaked goods. ? Ice cream. ? JamaicaFrench toast. ? Sweet rolls. ? Pizza. ? Cheese bread. ? Foods covered with batter, butter, creamy sauces, or cheese. ? Fried foods. ? Sugary drinks and desserts.  Foods that cause gas or bloating This information is not intended to replace advice given to you by your health care provider. Make sure you discuss any questions you have with your health care provider. Document Released: 12/29/2012 Document Revised: 06/01/2015 Document Reviewed: 12/07/2012 Elsevier Interactive Patient Education  2017 [employer]

## 2017-08-19 NOTE — Progress Notes (Signed)
Subjective: CC: GAD/Depression PCP: Raliegh Ip, DO OZH:YQMVH Janet [name] is a 22 y.o. female presenting to clinic today for:  1. GAD/depression History: Long-standing history of anxiety and panic w/ somatic manifestations.  No history of hospitalizations for mental health disorder.  Past medical history significant for congenital heart murmur that resolved and Hashimoto's thyroiditis.  Family history significant for anxiety in her mother.    Patient's Lexapro was increased to 20 mg at last visit.  Trazodone was added as needed sleep.  She follows up today for 1 month recheck and notes that symptoms have been under much better control.  She notes that she has not started the trazodone yet because she was unable to pick it up from the pharmacy but she intends to pick this up today.  She denies significant GI side effects outside of baseline with the Lexapro.  However, she does go on to state that she has had a greater than 1 year history of postprandial nausea with vomiting.  She notes that she is had right upper quadrant ultrasounds twice now which did not reveal any acute cholecystitis.  She is never seen a gastroenterologist for this.  She describes symptoms where she feels like "food sits" and she points to her diaphragm.  She says that she is woken up in the middle night with dry heaves.  She is used Tums for symptoms.  She does not attribute symptoms to consumption of any specific foods.  No hematochezia, melena or hematemesis.  ROS: Per HPI  No Known Allergies Past Medical History:  Diagnosis Date  . Allergy   . Anxiety   . GERD (gastroesophageal reflux disease)   . Headache(784.0)   . 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  . Thyroid disease    Hashimotos     Current Outpatient Medications:  .  acetaminophen (MIDOL) 650 MG CR tablet, Take 650 mg by mouth every 8 (eight) hours as needed (for menstrual pain/cramps)., Disp: , Rfl:  .  acetaminophen (TYLENOL) 500 MG  tablet, Take 1,000 mg by mouth every 8 (eight) hours as needed (for pain/headaches.)., Disp: , Rfl:  .  calcium carbonate (TUMS) 500 MG chewable tablet, Chew 2 tablets (400 mg of elemental calcium total) by mouth 2 (two) times daily., Disp: 90 tablet, Rfl: 1 .  escitalopram (LEXAPRO) 20 MG tablet, Take 20 mg by mouth daily., Disp: , Rfl:  .  ibuprofen (ADVIL,MOTRIN) 200 MG tablet, Take 600-800 mg by mouth every 8 (eight) hours as needed (for pain/headache)., Disp: , Rfl:  .  levothyroxine (SYNTHROID, LEVOTHROID) 200 MCG tablet, Take 1 tablet daily in am. Total daily dose 250 mcg daily., Disp: 45 tablet, Rfl: 3 .  levothyroxine (SYNTHROID, LEVOTHROID) 50 MCG tablet, Take 1 tablet (50 mcg total) by mouth daily. Total daily dose 250 mcg daily., Disp: 45 tablet, Rfl: 3 .  traZODone (DESYREL) 50 MG tablet, Take 0.5-1 tablets (25-50 mg total) by mouth at bedtime as needed for sleep., Disp: 30 tablet, Rfl: 3 Social History   Socioeconomic History  . Marital status: Single    Spouse name: Not on file  . Number of children: Not on file  . Years of education: Not on file  . Highest education level: Not on file  Occupational History  . Occupation: Conservation officer, nature    Comment: Clinical biochemist  Social Needs  . Financial resource strain: Not on file  . Food insecurity:    Worry: Not on file    Inability: Not on  file  . Transportation needs:    Medical: Not on file    Non-medical: Not on file  Tobacco Use  . Smoking status: Current Some Day Smoker    Packs/day: 0.50    Years: 2.00    Pack years: 1.00    Types: Cigarettes  . Smokeless tobacco: Never Used  Substance and Sexual Activity  . Alcohol use: Not Currently    Comment: rare  . Drug use: No  . Sexual activity: Never    Birth control/protection: None  Lifestyle  . Physical activity:    Days per week: Not on file    Minutes per session: Not on file  . Stress: Not on file  Relationships  . Social connections:    Talks on phone: Not on file      Gets together: Not on file    Attends religious service: Not on file    Active member of club or organization: Not on file    Attends meetings of clubs or organizations: Not on file    Relationship status: Not on file  . Intimate partner violence:    Fear of current or ex partner: Not on file    Emotionally abused: Not on file    Physically abused: Not on file    Forced sexual activity: Not on file  Other Topics Concern  . Not on file  Social History Narrative  . Not on file   Family History  Problem Relation Age of Onset  . Heart attack Maternal Grandmother   . Heart Problems Maternal Grandfather   . Heart attack Maternal Grandfather   . Cancer Paternal Grandmother   . Cancer Paternal Grandfather   . Diabetes Mother   . Other Mother        gastropresis  . Anxiety disorder Mother   . Other Father        syncope-has a pacemaker  . COPD Father     Objective: Office vital signs reviewed. BP 121/84   Pulse 72   Temp 98.3 F (36.8 C) (Oral)   Ht 5\' 7"  (1.702 m)   Wt 295 lb 12.8 oz (134.2 kg)   BMI 46.33 kg/m   Physical Examination:  General: Awake, alert, obese, No acute distress HEENT:  Sclera white, MMM Cardio: regular rate and rhythm, S1S2 heard, no murmurs appreciated Pulm: clear to auscultation bilaterally, no wheezes, rhonchi or rales; normal work of breathing on room air GI: obese, soft, non-tender, non-distended, bowel sounds present x4, no hepatomegaly, no splenomegaly, no masses Psych: mood stable, speech normal.  Affect appropriate, pleasant [facility] screen [facility] 08/19/2017 07/18/2017 06/05/2017  Decreased Interest 1 1 0  Down, Depressed, Hopeless 0 0 0  PHQ - 2 Score 1 1 0   GAD 7 : Generalized Anxiety Score 08/19/2017 07/18/2017 06/05/2017  Nervous, Anxious, on Edge 0 0 1  Control/stop worrying 0 2 3  Worry too much - different things 1 2 3   Trouble relaxing 1 2 2   Restless 0 0 1  Easily annoyed or irritable 1 2 3   Afraid - awful might happen 0 0  2  Total GAD 7 Score 3 8 15   Anxiety Difficulty - Somewhat difficult -   Assessment/ Plan: 22 y.o. female   1. GAD (generalized anxiety disorder) Anxiety subjectively under much better control.  Her score has improved from an 8 to a 3.  Continue Lexapro 20 mg daily.  2. Primary insomnia Has not yet picked up the trazodone.  Will follow-up with  me as needed on this issue.  3. Right upper quadrant abdominal pain Concern for possible gallbladder related issues versus uncontrolled GERD.  February 2017 right upper quadrant ultrasound results were reviewed which indicated a normal examination.  There are no stones or sludge appreciated on ultrasound.  Patient may benefit from HIDA scan.  Will refer to gastroenterology for further evaluation and interventions.  Handout provided for low-fat diet.  Follow-up PRN. - Ambulatory referral to Gastroenterology   Orders Placed This Encounter  Procedures  . Ambulatory referral to Gastroenterology    Referral Priority:   Routine    Referral Type:   Consultation    Referral Reason:   Specialty Services Required    Number of Visits Requested:   1   Meds ordered this encounter  Medications  . escitalopram (LEXAPRO) 20 MG tablet    Sig: Take 1 tablet (20 mg total) by mouth daily.    Dispense:  90 tablet    Refill:  1     Darren Nodal Hulen SkainsM Raenell Mensing, DO [facility] [phone]

## 2017-09-10 ENCOUNTER — Ambulatory Visit (INDEPENDENT_AMBULATORY_CARE_PROVIDER_SITE_OTHER): Payer: BLUE CROSS/BLUE SHIELD | Admitting: Internal Medicine

## 2017-09-11 ENCOUNTER — Other Ambulatory Visit: Payer: Self-pay | Admitting: Family Medicine

## 2017-09-16 ENCOUNTER — Ambulatory Visit: Payer: BLUE CROSS/BLUE SHIELD | Admitting: Family Medicine

## 2017-09-16 ENCOUNTER — Encounter: Payer: Self-pay | Admitting: Family Medicine

## 2017-09-16 VITALS — BP 126/85 | HR 82 | Temp 98.8°F | Ht 67.0 in | Wt 309.0 lb

## 2017-09-16 DIAGNOSIS — Z6841 Body Mass Index (BMI) 40.0 and over, adult: Secondary | ICD-10-CM | POA: Diagnosis not present

## 2017-09-16 DIAGNOSIS — M7989 Other specified soft tissue disorders: Secondary | ICD-10-CM | POA: Diagnosis not present

## 2017-09-16 DIAGNOSIS — S39012A Strain of muscle, fascia and tendon of lower back, initial encounter: Secondary | ICD-10-CM | POA: Insufficient documentation

## 2017-09-16 LAB — CBC WITH DIFFERENTIAL/PLATELET
Basophils Absolute: 0.1 10*3/uL (ref 0.0–0.2)
Basos: 1 %
EOS (ABSOLUTE): 0.2 10*3/uL (ref 0.0–0.4)
Eos: 2 %
Hematocrit: 36.2 % (ref 34.0–46.6)
Hemoglobin: 12 g/dL (ref 11.1–15.9)
Immature Grans (Abs): 0 10*3/uL (ref 0.0–0.1)
Immature Granulocytes: 0 %
Lymphocytes Absolute: 2.2 10*3/uL (ref 0.7–3.1)
Lymphs: 21 %
MCH: 30.1 pg (ref 26.6–33.0)
MCHC: 33.1 g/dL (ref 31.5–35.7)
MCV: 91 fL (ref 79–97)
Monocytes Absolute: 0.6 10*3/uL (ref 0.1–0.9)
Monocytes: 5 %
Neutrophils Absolute: 7.4 10*3/uL — ABNORMAL HIGH (ref 1.4–7.0)
Neutrophils: 71 %
Platelets: 276 10*3/uL (ref 150–450)
RBC: 3.99 x10E6/uL (ref 3.77–5.28)
RDW: 15.1 % (ref 12.3–15.4)
WBC: 10.5 10*3/uL (ref 3.4–10.8)

## 2017-09-16 LAB — CMP14+EGFR
ALT: 46 IU/L — ABNORMAL HIGH (ref 0–32)
AST: 56 IU/L — ABNORMAL HIGH (ref 0–40)
Albumin/Globulin Ratio: 1.6 (ref 1.2–2.2)
Albumin: 4.4 g/dL (ref 3.5–5.5)
Alkaline Phosphatase: 80 IU/L (ref 39–117)
BUN/Creatinine Ratio: 14 (ref 9–23)
BUN: 14 mg/dL (ref 6–20)
Bilirubin Total: 0.4 mg/dL (ref 0.0–1.2)
CO2: 20 mmol/L (ref 20–29)
Calcium: 8.8 mg/dL (ref 8.7–10.2)
Chloride: 97 mmol/L (ref 96–106)
Creatinine, Ser: 0.99 mg/dL (ref 0.57–1.00)
GFR calc Af Amer: 93 mL/min/{1.73_m2} (ref >59)
GFR calc non Af Amer: 81 mL/min/{1.73_m2} (ref >59)
Globulin, Total: 2.8 g/dL (ref 1.5–4.5)
Glucose: 80 mg/dL (ref 65–99)
Potassium: 4.1 mmol/L (ref 3.5–5.2)
Sodium: 136 mmol/L (ref 134–144)
Total Protein: 7.2 g/dL (ref 6.0–8.5)

## 2017-09-16 MED ORDER — CYCLOBENZAPRINE HCL 5 MG PO TABS: 5.0000 mg | ORAL_TABLET | Freq: Three times a day (TID) | ORAL | 0 refills | Status: DC | PRN

## 2017-09-16 MED ORDER — LIDOCAINE 5 % EX PTCH: 1.0000 | MEDICATED_PATCH | CUTANEOUS | 0 refills | Status: DC

## 2017-09-16 NOTE — Progress Notes (Signed)
Subjective:    Patient ID: Janet [name], female    DOB: [birth year], 23 y.o.   MRN: [id number]  Chief Complaint:  Edema in hands, feet and ankles (feels as if she is not urinating as much; symptoms x 2 days) and Back Pain   HPI: Janet [name] is a 22 y.o. female presenting on 09/16/2017 for Edema in hands, feet and ankles (feels as if she is not urinating as much; symptoms x 2 days) and Back Pain  Pt presents today with complaints of bilateral hand and feet swelling. Pt states she noticed this yesterday. Pt states she has not been urinating as much as she normally does. Denies dysuria. Pt states the swelling has been constant since she noticed this. She is unable to recall exact amount of sodium or NSAID use. States she does take Aleve or ibuprofen for pain control at home. States she does drink at least 2 Sprites per day. Pt denies chest pain, shortness of breath, palpitations, or orthopnea. Pt states her shoes and rings are tight. Pt denies having these symptoms in the past. Pt also reports lower back pain that started 1 week ago after a few days of bending and stooping at work. States the pain radiates across her lower back and is dull in nature. She denies radiation of pain. States certain movements or positions make the pain worse. States the pain is a 8/10 at worst and a 4/10 at best. She has been using BenGay at night with minimal relief of the pain.   Relevant past medical, surgical, family and social history reviewed and updated as indicated. Interim medical history since our last visit reviewed. Allergies and medications reviewed and updated. DATA REVIEWED: CHART IN EPIC  Family History reviewed for pertinent findings.  Past Medical History:  Diagnosis Date  . Allergy   . Anxiety   . GERD (gastroesophageal reflux disease)   . Headache(784.0)   . 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  . Thyroid disease    Hashimotos     Past Surgical History:  Procedure Laterality  Date  . THYROIDECTOMY N/A 07/01/2017   Procedure: TOTAL THYROIDECTOMY;  Surgeon: [name], MD;  Location: [facility];  Service: General;  Laterality: N/A;  . TONSILLECTOMY AND ADENOIDECTOMY Bilateral 01/2011  . TOTAL THYROIDECTOMY     Dr. [name] 07-01-17    Social History   Socioeconomic History  . Marital status: Single    Spouse name: Not on file  . Number of children: Not on file  . Years of education: Not on file  . Highest education level: Not on file  Occupational History  . Occupation: Scientist, water quality    Comment: Therapist, art  Social Needs  . Financial resource strain: Not on file  . Food insecurity:    Worry: Not on file    Inability: Not on file  . Transportation needs:    Medical: Not on file    Non-medical: Not on file  Tobacco Use  . Smoking status: Current Some Day Smoker    Packs/day: 0.50    Years: 2.00    Pack years: 1.00    Types: Cigarettes  . Smokeless tobacco: Never Used  Substance and Sexual Activity  . Alcohol use: Not Currently    Comment: rare  . Drug use: No  . Sexual activity: Never    Birth control/protection: None  Lifestyle  . Physical activity:    Days per week: Not on file    Minutes  per session: Not on file  . Stress: Not on file  Relationships  . Social connections:    Talks on phone: Not on file    Gets together: Not on file    Attends religious service: Not on file    Active member of club or organization: Not on file    Attends meetings of clubs or organizations: Not on file    Relationship status: Not on file  . Intimate partner violence:    Fear of current or ex partner: Not on file    Emotionally abused: Not on file    Physically abused: Not on file    Forced sexual activity: Not on file  Other Topics Concern  . Not on file  Social History Narrative  . Not on file    Allergies as of 09/16/2017   No Known Allergies     Medication List        Accurate as of 09/16/17  9:51 AM. Always use your most recent med list.           acetaminophen 500 MG tablet Commonly known as:  TYLENOL Take 1,000 mg by mouth every 8 (eight) hours as needed (for pain/headaches.).   MIDOL 650 MG CR tablet Generic drug:  acetaminophen Take 650 mg by mouth every 8 (eight) hours as needed (for menstrual pain/cramps).   calcium carbonate 500 MG chewable tablet Commonly known as:  TUMS - dosed in mg elemental calcium Chew 2 tablets (400 mg of elemental calcium total) by mouth 2 (two) times daily.   cyclobenzaprine 5 MG tablet Commonly known as:  FLEXERIL Take 1 tablet (5 mg total) by mouth 3 (three) times daily as needed for muscle spasms.   escitalopram 20 MG tablet Commonly known as:  LEXAPRO Take 1 tablet (20 mg total) by mouth daily.   ibuprofen 200 MG tablet Commonly known as:  ADVIL,MOTRIN Take 600-800 mg by mouth every 8 (eight) hours as needed (for pain/headache).   levothyroxine 200 MCG tablet Commonly known as:  SYNTHROID, LEVOTHROID Take 1 tablet daily in am. Total daily dose 250 mcg daily.   levothyroxine 50 MCG tablet Commonly known as:  SYNTHROID, LEVOTHROID Take 1 tablet (50 mcg total) by mouth daily. Total daily dose 250 mcg daily.   lidocaine 5 % Commonly known as:  LIDODERM Place 1 patch onto the skin daily. Remove & Discard patch within 12 hours or as directed by MD   traZODone 50 MG tablet Commonly known as:  DESYREL TAKE 0.5-1 TABLETS (25-50 MG TOTAL) BY MOUTH AT BEDTIME AS NEEDED FOR SLEEP.       No Known Allergies  Review of Systems  Constitutional: Negative for chills, fatigue and fever.  HENT: Negative.   Respiratory: Negative for cough, chest tightness, shortness of breath and wheezing.   Cardiovascular: Positive for leg swelling. Negative for chest pain and palpitations.       Hand swelling  Gastrointestinal: Negative for abdominal distention and abdominal pain.  Endocrine: Negative for cold intolerance, heat intolerance, polydipsia, polyphagia and polyuria.  Genitourinary:  Negative for difficulty urinating, dysuria, flank pain, frequency and hematuria.  Musculoskeletal: Positive for back pain.  Neurological: Negative for dizziness, seizures, weakness, light-headedness, numbness and headaches.  All other systems reviewed and are negative.       Objective:    BP 126/85   Pulse 82   Temp 98.8 F (37.1 C) (Oral)   Ht 5' 7"  (1.702 m)   Wt (!) 309 lb (140.2 kg)  BMI 48.40 kg/m    Wt Readings from Last 3 Encounters:  09/16/17 (!) 309 lb (140.2 kg)  08/19/17 295 lb 12.8 oz (134.2 kg)  07/18/17 292 lb (132.5 kg)    Physical Exam  Constitutional: She is oriented to person, place, and time. She appears well-nourished. No distress.  HENT:  Head: Normocephalic.  Eyes: Pupils are equal, round, and reactive to light.  Neck: Trachea normal, normal range of motion and phonation normal. Neck supple.  Thyroidectomy scar, no erythema or drainage.   Cardiovascular: Normal rate, regular rhythm, normal heart sounds, intact distal pulses and normal pulses. Exam reveals no gallop and no friction rub.  No murmur heard. Nonpitting edema to bilateral feet and hands.   Pulmonary/Chest: Effort normal and breath sounds normal. No stridor. No respiratory distress. She has no wheezes. She has no rales. She exhibits no tenderness.  Abdominal: Soft. Bowel sounds are normal.  Musculoskeletal: Normal range of motion.       Lumbar back: She exhibits tenderness, pain and spasm. She exhibits normal range of motion.  Negative straight leg raise test bilaterally  Neurological: She is alert and oriented to person, place, and time.  Skin: Skin is warm and dry. Capillary refill takes less than 2 seconds.  Psychiatric: She has a normal mood and affect. Her behavior is normal. Judgment and thought content normal.  Nursing note and vitals reviewed.   Results for orders placed or performed during the hospital encounter of 07/01/17  Pregnancy, urine  Result Value Ref Range   Preg  Test, Ur NEGATIVE NEGATIVE  Basic metabolic panel  Result Value Ref Range   Sodium 138 135 - 145 mmol/L   Potassium 4.1 3.5 - 5.1 mmol/L   Chloride 104 98 - 111 mmol/L   CO2 26 22 - 32 mmol/L   Glucose, Bld 119 (H) 70 - 99 mg/dL   BUN 8 6 - 20 mg/dL   Creatinine, Ser 0.70 0.44 - 1.00 mg/dL   Calcium 8.7 (L) 8.9 - 10.3 mg/dL   GFR calc non Af Amer >60 >60 mL/min   GFR calc Af Amer >60 >60 mL/min   Anion gap 8 5 - 15      Assessment & Plan:   1. Swelling of both hands Decrease sodium intake. Increase water intake. Tylenol instead of NSAIDS for pain and fever control. Labs pending - CBC with Differential/Platelet - CMP14+EGFR - Microalbumin / creatinine urine ratio  2. Swelling of both lower extremities Decrease sodium intake. Increase water intake. Tylenol instead of NSAIDS for pain and fever control. Labs pending. Elevate feet when sitting or lying down.  - CBC with Differential/Platelet - CMP14+EGFR - Microalbumin / creatinine urine ratio  3. Lumbar strain, initial encounter Lidoderm patches during the day, Flexeril at night (sedation precautions given). Tylenol as needed for pain. Back exercises. Ice or heat as needed to help with discomfort. Weight management.  - cyclobenzaprine (FLEXERIL) 5 MG tablet; Take 1 tablet (5 mg total) by mouth 3 (three) times daily as needed for muscle spasms.  Dispense: 30 tablet; Refill: 0 - lidocaine (LIDODERM) 5 %; Place 1 patch onto the skin daily. Remove & Discard patch within 12 hours or as directed by MD  Dispense: 30 patch; Refill: 0     Follow up plan: Return in about 2 weeks (around 09/30/2017), or if symptoms worsen or fail to improve.  Educational handout given for low back strain and back exercises  The above assessment and management plan was discussed with the  patient. The patient verbalized understanding of and has agreed to the management plan. Patient is aware to call the clinic if symptoms persist or worsen. Patient is aware  when to return to the clinic for a follow-up visit. Patient educated on when it is appropriate to go to the emergency department.   Monia Pouch, FNP-C [facility] [phone]

## 2017-09-16 NOTE — Patient Instructions (Signed)
Back Exercises If you have pain in your back, do these exercises 2-3 times each day or as told by your doctor. When the pain goes away, do the exercises once each day, but repeat the steps more times for each exercise (do more repetitions). If you do not have pain in your back, do these exercises once each day or as told by your doctor. Exercises Single Knee to Chest  Do these steps 3-5 times in a row for each leg: 1. Lie on your back on a firm bed or the floor with your legs stretched out. 2. Bring one knee to your chest. 3. Hold your knee to your chest by grabbing your knee or thigh. 4. Pull on your knee until you feel a gentle stretch in your lower back. 5. Keep doing the stretch for 10-30 seconds. 6. Slowly let go of your leg and straighten it.  Pelvic Tilt  Do these steps 5-10 times in a row: 1. Lie on your back on a firm bed or the floor with your legs stretched out. 2. Bend your knees so they point up to the ceiling. Your feet should be flat on the floor. 3. Tighten your lower belly (abdomen) muscles to press your lower back against the floor. This will make your tailbone point up to the ceiling instead of pointing down to your feet or the floor. 4. Stay in this position for 5-10 seconds while you gently tighten your muscles and breathe evenly.  Cat-Cow  Do these steps until your lower back bends more easily: 1. Get on your hands and knees on a firm surface. Keep your hands under your shoulders, and keep your knees under your hips. You may put padding under your knees. 2. Let your head hang down, and make your tailbone point down to the floor so your lower back is round like the back of a cat. 3. Stay in this position for 5 seconds. 4. Slowly lift your head and make your tailbone point up to the ceiling so your back hangs low (sags) like the back of a cow. 5. Stay in this position for 5 seconds.  Press-Ups  Do these steps 5-10 times in a row: 1. Lie on your belly (face-down)  on the floor. 2. Place your hands near your head, about shoulder-width apart. 3. While you keep your back relaxed and keep your hips on the floor, slowly straighten your arms to raise the top half of your body and lift your shoulders. Do not use your back muscles. To make yourself more comfortable, you may change where you place your hands. 4. Stay in this position for 5 seconds. 5. Slowly return to lying flat on the floor.  Bridges  Do these steps 10 times in a row: 1. Lie on your back on a firm surface. 2. Bend your knees so they point up to the ceiling. Your feet should be flat on the floor. 3. Tighten your butt muscles and lift your butt off of the floor until your waist is almost as high as your knees. If you do not feel the muscles working in your butt and the back of your thighs, slide your feet 1-2 inches farther away from your butt. 4. Stay in this position for 3-5 seconds. 5. Slowly lower your butt to the floor, and let your butt muscles relax.  If this exercise is too easy, try doing it with your arms crossed over your chest. Belly Crunches  Do these steps 5-10 times in   a row: 1. Lie on your back on a firm bed or the floor with your legs stretched out. 2. Bend your knees so they point up to the ceiling. Your feet should be flat on the floor. 3. Cross your arms over your chest. 4. Tip your chin a little bit toward your chest but do not bend your neck. 5. Tighten your belly muscles and slowly raise your chest just enough to lift your shoulder blades a tiny bit off of the floor. 6. Slowly lower your chest and your head to the floor.  Back Lifts Do these steps 5-10 times in a row: 1. Lie on your belly (face-down) with your arms at your sides, and rest your forehead on the floor. 2. Tighten the muscles in your legs and your butt. 3. Slowly lift your chest off of the floor while you keep your hips on the floor. Keep the back of your head in line with the curve in your back. Look at  the floor while you do this. 4. Stay in this position for 3-5 seconds. 5. Slowly lower your chest and your face to the floor.  Contact a doctor if:  Your back pain gets a lot worse when you do an exercise.  Your back pain does not lessen 2 hours after you exercise. If you have any of these problems, stop doing the exercises. Do not do them again unless your doctor says it is okay. Get help right away if:  You have sudden, very bad back pain. If this happens, stop doing the exercises. Do not do them again unless your doctor says it is okay. This information is not intended to replace advice given to you by your health care provider. Make sure you discuss any questions you have with your health care provider. Document Released: 01/26/2010 Document Revised: 06/01/2015 Document Reviewed: 02/17/2014 Elsevier Interactive Patient Education  2018 [employer]. Low Back Sprain A sprain is a stretch or tear in the bands of tissue that hold bones and joints together (ligaments). Sprains of the lower back (lumbar spine) are a common cause of low back pain. A sprain occurs when ligaments are overextended or stretched beyond their limits. The ligaments can become inflamed, resulting in pain and sudden muscle tightening (spasms). A sprain can be caused by an injury (trauma), or it can develop gradually due to overuse. There are three types of sprains:  Grade 1 is a mild sprain involving an overstretched ligament or a very slight tear of the ligament.  Grade 2 is a moderate sprain involving a partial tear of the ligament.  Grade 3 is a severe sprain involving a complete tear of the ligament.  What are the causes? This condition may be caused by:  Trauma, such as a fall or a hit to the body.  Twisting or overstretching the back. This may result from doing activities that require a lot of energy, such as lifting heavy objects.  What increases the risk? The following factors may increase your risk of  getting this condition:  Playing contact sports.  Participating in sports or activities that put excessive stress on the back and require a lot of bending and twisting, including: ? Lifting weights or heavy objects. ? Gymnastics. ? Soccer. ? Figure skating. ? Snowboarding.  Being overweight or obese.  Having poor strength and flexibility.  What are the signs or symptoms? Symptoms of this condition may include:  Sharp or dull pain in the lower back that does not go away.  Pain may extend to the buttocks.  Stiffness.  Limited range of motion.  Inability to stand up straight due to stiffness or pain.  Muscle spasms.  How is this diagnosed?  This condition may be diagnosed based on:  Your symptoms.  Your medical history.  A physical exam. ? Your health care provider may push on certain areas of your back to determine the source of your pain. ? You may be asked to bend forward, backward, and side to side to assess the severity of your pain and your range of motion.  Imaging tests, such as: ? X-rays. ? MRI.  How is this treated? Treatment for this condition may include:  Applying heat and cold to the affected area.  Medicines to help relieve pain and to relax your muscles (muscle relaxants).  NSAIDs to help reduce swelling and discomfort.  Physical therapy.  When your symptoms improve, it is important to gradually return to your normal routine as soon as possible to reduce pain, avoid stiffness, and avoid loss of muscle strength. Generally, symptoms should improve within 6 weeks of treatment. However, recovery time varies. Follow these instructions at home: Managing pain, stiffness, and swelling  If directed, apply ice to the injured area during the first 24 hours after your injury. ? Put ice in a plastic bag. ? Place a towel between your skin and the bag. ? Leave the ice on for 20 minutes, 2-3 times a day.  If directed, apply heat to the affected area as  often as told by your health care provider. Use the heat source that your health care provider recommends, such as a moist heat pack or a heating pad. ? Place a towel between your skin and the heat source. ? Leave the heat on for 20-30 minutes. ? Remove the heat if your skin turns bright red. This is especially important if you are unable to feel pain, heat, or cold. You may have a greater risk of getting burned. Activity  Rest and return to your normal activities as told by your health care provider. Ask your health care provider what activities are safe for you.  Avoid activities that take a lot of effort (are strenuous) for as long as told by your health care provider.  Do exercises as told by your health care provider. General instructions   Take over-the-counter and prescription medicines only as told by your health care provider.  If you have questions or concerns about safety while taking pain medicine, talk with your health care provider.  Do not drive or operate heavy machinery until you know how your pain medicine affects you.  Do not use any tobacco products, such as cigarettes, chewing tobacco, and e-cigarettes. Tobacco can delay bone healing. If you need help quitting, ask your health care provider.  Keep all follow-up visits as told by your health care provider. This is important. How is this prevented?  Warm up and stretch before being active.  Cool down and stretch after being active.  Give your body time to rest between periods of activity.  Avoid: ? Being physically inactive for long periods at a time. ? Exercising or playing sports when you are tired or in pain.  Use correct form when playing sports and lifting heavy objects.  Use good posture when sitting and standing.  Maintain a healthy weight.  Sleep on a mattress with medium firmness to support your back.  Make sure to use equipment that fits you, including shoes that fit well.  Be safe and  responsible while being active to avoid falls.  Do at least 150 minutes of moderate-intensity exercise each week, such as brisk walking or water aerobics. Try a form of exercise that takes stress off your back, such as swimming or stationary cycling.  Maintain physical fitness, including: ? Strength. In particular, develop and maintain strong abdominal muscles. ? Flexibility. ? Cardiovascular fitness. ? Endurance. Contact a health care provider if:  Your back pain does not improve after 6 weeks of treatment.  Your symptoms get worse. Get help right away if:  Your back pain is severe.  You are unable to stand or walk.  You develop pain in your legs.  You develop weakness in your buttocks or legs.  You have difficulty controlling when you urinate or when you have a bowel movement. This information is not intended to replace advice given to you by your health care provider. Make sure you discuss any questions you have with your health care provider. Document Released: 12/24/2004 Document Revised: 08/31/2015 Document Reviewed: 10/05/2014 Elsevier Interactive Patient Education  [employer].

## 2017-09-17 LAB — MICROALBUMIN / CREATININE URINE RATIO
Creatinine, Urine: 187.2 mg/dL
Microalb/Creat Ratio: 2.7 mg/g creat (ref 0.0–30.0)
Microalbumin, Urine: 5.1 ug/mL

## 2017-09-18 ENCOUNTER — Ambulatory Visit (INDEPENDENT_AMBULATORY_CARE_PROVIDER_SITE_OTHER): Payer: BLUE CROSS/BLUE SHIELD | Admitting: Internal Medicine

## 2017-09-18 ENCOUNTER — Encounter (INDEPENDENT_AMBULATORY_CARE_PROVIDER_SITE_OTHER): Payer: Self-pay | Admitting: Internal Medicine

## 2017-09-18 VITALS — BP 130/82 | HR 72 | Temp 97.9°F | Ht 66.0 in | Wt 306.4 lb

## 2017-09-18 DIAGNOSIS — R748 Abnormal levels of other serum enzymes: Secondary | ICD-10-CM | POA: Diagnosis not present

## 2017-09-18 DIAGNOSIS — R1011 Right upper quadrant pain: Secondary | ICD-10-CM | POA: Diagnosis not present

## 2017-09-18 LAB — HEPATIC FUNCTION PANEL
AG Ratio: 1.5 (calc) (ref 1.0–2.5)
ALT: 42 U/L — ABNORMAL HIGH (ref 6–29)
AST: 51 U/L — ABNORMAL HIGH (ref 10–30)
Albumin: 4.1 g/dL (ref 3.6–5.1)
Alkaline phosphatase (APISO): 82 U/L (ref 33–115)
Bilirubin, Direct: 0.1 mg/dL (ref 0.0–0.2)
Globulin: 2.7 g/dL (calc) (ref 1.9–3.7)
Indirect Bilirubin: 0.4 mg/dL (calc) (ref 0.2–1.2)
Total Bilirubin: 0.5 mg/dL (ref 0.2–1.2)
Total Protein: 6.8 g/dL (ref 6.1–8.1)

## 2017-09-18 NOTE — Progress Notes (Signed)
Subjective:    Patient ID: Janet [name], female    DOB: 03/04/1995, 22 y.o.   MRN: [id number]  [facility]. The pain comes and goes. She has some nausea and vomiting at times. The pain may occur once a month. She usually vomits in the morning. She sometimes get the pain after she eats. Her appetite is okay.  No weight loss. Has a BM x 1 a day.  She also has slightly elevated liver enzymes.  Morbidly obese.   CMP Latest Ref Rng & Units 09/16/2017 07/02/2017 06/05/2017  Glucose 65 - 99 mg/dL 80 595(G119(H) 85  BUN 6 - 20 mg/dL 14 8 10   Creatinine 0.57 - 1.00 mg/dL 3.870.99 5.640.70 3.320.81  Sodium 134 - 144 mmol/L 136 138 138  Potassium 3.5 - 5.2 mmol/L 4.1 4.1 4.1  Chloride 96 - 106 mmol/L 97 104 100  CO2 20 - 29 mmol/L 20 26 21   Calcium 8.7 - 10.2 mg/dL 8.8 9.5(J8.7(L) 9.1  Total Protein 6.0 - 8.5 g/dL 7.2 - 6.7  Total Bilirubin 0.0 - 1.2 mg/dL 0.4 - 0.4  Alkaline Phos 39 - 117 IU/L 80 - 89  AST 0 - 40 IU/L 56(H) - 10  ALT 0 - 32 IU/L 46(H) - 9     Review of Systems     Past Medical History:  Diagnosis Date  . Allergy   . Anxiety   . GERD (gastroesophageal reflux disease)   . Headache(784.0)   . 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  . Thyroid disease    [place]     Past Surgical History:  Procedure Laterality Date  . THYROIDECTOMY N/A 07/01/2017   Procedure: TOTAL THYROIDECTOMY;  Surgeon: Darnell LevelGerkin, Todd, MD;  Location: [facility];  Service: General;  Laterality: N/A;  . TONSILLECTOMY AND ADENOIDECTOMY Bilateral 01/2011  . TOTAL THYROIDECTOMY     Dr. Gerrit FriendsGerkin 07-01-17    No Known Allergies  Current Outpatient Medications on File Prior to Visit  Medication Sig Dispense Refill  . acetaminophen (MIDOL) 650 MG CR tablet Take 650 mg by mouth every 8 (eight) hours as needed (for menstrual pain/cramps).    . calcium carbonate (TUMS) 500 MG chewable tablet Chew 2 tablets (400 mg of elemental calcium total) by mouth 2 (two) times daily. 90 tablet 1   . cyclobenzaprine (FLEXERIL) 5 MG tablet Take 1 tablet (5 mg total) by mouth 3 (three) times daily as needed for muscle spasms. 30 tablet 0  . escitalopram (LEXAPRO) 20 MG tablet Take 1 tablet (20 mg total) by mouth daily. 90 tablet 1  . ibuprofen (ADVIL,MOTRIN) 200 MG tablet Take 600-800 mg by mouth every 8 (eight) hours as needed (for pain/headache).    Marland Kitchen. levothyroxine (SYNTHROID, LEVOTHROID) 200 MCG tablet Take 1 tablet daily in am. Total daily dose 250 mcg daily. 45 tablet 3  . levothyroxine (SYNTHROID, LEVOTHROID) 50 MCG tablet Take 1 tablet (50 mcg total) by mouth daily. Total daily dose 250 mcg daily. 45 tablet 3  . lidocaine (LIDODERM) 5 % Place 1 patch onto the skin daily. Remove & Discard patch within 12 hours or as directed by MD 30 patch 0  . traZODone (DESYREL) 50 MG tablet TAKE 0.5-1 TABLETS (25-50 MG TOTAL) BY MOUTH AT BEDTIME AS NEEDED FOR SLEEP. 90 tablet 0   No current facility-administered medications on file prior to visit.      Objective:   Physical Exam Blood pressure 130/82, pulse 72, temperature 97.9  F (36.6 C), height 5\' 6"  (1.676 m), weight (!) 306 lb 6.4 oz (139 kg). Alert and oriented. Skin warm and dry. Oral mucosa is moist.   . Sclera anicteric, conjunctivae is pink. Thyroid not enlarged. No cervical lymphadenopathy. Lungs clear. Heart regular rate and rhythm.  Abdomen is soft. Bowel sounds are positive. No hepatomegaly. No abdominal masses felt. No tenderness.  No edema to lower extremities.  .         Assessment & Plan:  RUQ pain. Am going to get a HIDA scan. Elevated liver enzymes:  Hepatic function.

## 2017-09-24 ENCOUNTER — Encounter: Payer: BLUE CROSS/BLUE SHIELD | Admitting: Family Medicine

## 2017-09-25 ENCOUNTER — Ambulatory Visit (HOSPITAL_COMMUNITY)
Admission: RE | Admit: 2017-09-25 | Discharge: 2017-09-25 | Disposition: A | Discharge status: Home / Self Care | Payer: BLUE CROSS/BLUE SHIELD | Source: Ambulatory Visit | Attending: Internal Medicine | Admitting: Internal Medicine

## 2017-09-25 DIAGNOSIS — R112 Nausea with vomiting, unspecified: Secondary | ICD-10-CM | POA: Diagnosis not present

## 2017-09-25 DIAGNOSIS — R748 Abnormal levels of other serum enzymes: Secondary | ICD-10-CM | POA: Insufficient documentation

## 2017-09-25 DIAGNOSIS — R1011 Right upper quadrant pain: Secondary | ICD-10-CM

## 2017-09-25 MED ORDER — TECHNETIUM TC 99M MEBROFENIN IV KIT
5.0000 | PACK | Freq: Once | INTRAVENOUS | Status: AC | PRN
  Administered 2017-09-25: 4.8 via INTRAVENOUS

## 2017-10-03 ENCOUNTER — Encounter: Payer: Self-pay | Admitting: Family Medicine

## 2017-10-03 ENCOUNTER — Ambulatory Visit (INDEPENDENT_AMBULATORY_CARE_PROVIDER_SITE_OTHER): Payer: BLUE CROSS/BLUE SHIELD | Admitting: Family Medicine

## 2017-10-03 VITALS — BP 129/89 | HR 89 | Temp 98.7°F | Ht 66.0 in | Wt 306.0 lb

## 2017-10-03 DIAGNOSIS — E890 Postprocedural hypothyroidism: Secondary | ICD-10-CM | POA: Diagnosis not present

## 2017-10-03 DIAGNOSIS — R600 Localized edema: Secondary | ICD-10-CM

## 2017-10-03 DIAGNOSIS — Z01419 Encounter for gynecological examination (general) (routine) without abnormal findings: Secondary | ICD-10-CM

## 2017-10-03 DIAGNOSIS — R635 Abnormal weight gain: Secondary | ICD-10-CM | POA: Diagnosis not present

## 2017-10-03 DIAGNOSIS — Z124 Encounter for screening for malignant neoplasm of cervix: Secondary | ICD-10-CM

## 2017-10-03 DIAGNOSIS — Z23 Encounter for immunization: Secondary | ICD-10-CM

## 2017-10-03 DIAGNOSIS — Z6841 Body Mass Index (BMI) 40.0 and over, adult: Secondary | ICD-10-CM

## 2017-10-03 DIAGNOSIS — E039 Hypothyroidism, unspecified: Secondary | ICD-10-CM | POA: Diagnosis not present

## 2017-10-03 NOTE — Patient Instructions (Signed)
You had labs performed today.  You will be contacted with the results of the labs once they are available, usually in the next 3 business days for routine lab work.  If you had a pap smear or biopsy performed, expect to be contacted in about 7-10 days.   Preventive Care 18-39 Years, Female Preventive care refers to lifestyle choices and visits with your health care provider that can promote health and wellness. What does preventive care include?  A yearly physical exam. This is also called an annual well check.  Dental exams once or twice a year.  Routine eye exams. Ask your health care provider how often you should have your eyes checked.  Personal lifestyle choices, including: ? Daily care of your teeth and gums. ? Regular physical activity. ? Eating a healthy diet. ? Avoiding tobacco and drug use. ? Limiting alcohol use. ? Practicing safe sex. ? Taking vitamin and mineral supplements as recommended by your health care provider. What happens during an annual well check? The services and screenings done by your health care provider during your annual well check will depend on your age, overall health, lifestyle risk factors, and family history of disease. Counseling Your health care provider may ask you questions about your:  Alcohol use.  Tobacco use.  Drug use.  Emotional well-being.  Home and relationship well-being.  Sexual activity.  Eating habits.  Work and work environment.  Method of birth control.  Menstrual cycle.  Pregnancy history.  Screening You may have the following tests or measurements:  Height, weight, and BMI.  Diabetes screening. This is done by checking your blood sugar (glucose) after you have not eaten for a while (fasting).  Blood pressure.  Lipid and cholesterol levels. These may be checked every 5 years starting at age 20.  Skin check.  Hepatitis C blood test.  Hepatitis B blood test.  Sexually transmitted disease (STD)  testing.  BRCA-related cancer screening. This may be done if you have a family history of breast, ovarian, tubal, or peritoneal cancers.  Pelvic exam and Pap test. This may be done every 3 years starting at age 21. Starting at age 30, this may be done every 5 years if you have a Pap test in combination with an HPV test.  Discuss your test results, treatment options, and if necessary, the need for more tests with your health care provider. Vaccines Your health care provider may recommend certain vaccines, such as:  Influenza vaccine. This is recommended every year.  Tetanus, diphtheria, and acellular pertussis (Tdap, Td) vaccine. You may need a Td booster every 10 years.  Varicella vaccine. You may need this if you have not been vaccinated.  HPV vaccine. If you are 26 or younger, you may need three doses over 6 months.  Measles, mumps, and rubella (MMR) vaccine. You may need at least one dose of MMR. You may also need a second dose.  Pneumococcal 13-valent conjugate (PCV13) vaccine. You may need this if you have certain conditions and were not previously vaccinated.  Pneumococcal polysaccharide (PPSV23) vaccine. You may need one or two doses if you smoke cigarettes or if you have certain conditions.  Meningococcal vaccine. One dose is recommended if you are age 19-21 years and a first-year college student living in a residence hall, or if you have one of several medical conditions. You may also need additional booster doses.  Hepatitis A vaccine. You may need this if you have certain conditions or if you travel or work   in places where you may be exposed to hepatitis A.  Hepatitis B vaccine. You may need this if you have certain conditions or if you travel or work in places where you may be exposed to hepatitis B.  Haemophilus influenzae type b (Hib) vaccine. You may need this if you have certain risk factors.  Talk to your health care provider about which screenings and vaccines you  need and how often you need them. This information is not intended to replace advice given to you by your health care provider. Make sure you discuss any questions you have with your health care provider. Document Released: 02/19/2001 Document Revised: 09/13/2015 Document Reviewed: 10/25/2014 Elsevier Interactive Patient Education  2018 [employer]  

## 2017-10-03 NOTE — Progress Notes (Signed)
Janet [name] is a 22 y.o. female presents to office today for annual physical exam examination.    Concerns today include: 1. Weight gain/ LE edema Patient reports that she is had quite a bit of weight gain and lower extremity edema over the last couple of weeks.  She was seen on 09/16/2017 for swelling and had labs drawn.  She was noted to have elevated LFTs.  She is currently under the care of gastroenterology for elevation in LFTs and had a HIDA scan performed.  She continues to have intermittent right upper quadrant discomfort.  She is awaiting for the results of the HIDA scan.  Marital status: single, Substance use: none Diet: does not watch salt, Exercise: infrequent Last eye exam: >2 years Last pap smear: never Refills needed today: n.a Immunizations needed: Flu Vaccine: Yes  Past Medical History:  Diagnosis Date  . Allergy   . Anxiety   . GERD (gastroesophageal reflux disease)   . Headache(784.0)   . 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  . Thyroid disease    Hashimotos    Social History   Socioeconomic History  . Marital status: Single    Spouse name: Not on file  . Number of children: Not on file  . Years of education: Not on file  . Highest education level: Not on file  Occupational History  . Occupation: Scientist, water quality    Comment: Therapist, art  Social Needs  . Financial resource strain: Not on file  . Food insecurity:    Worry: Not on file    Inability: Not on file  . Transportation needs:    Medical: Not on file    Non-medical: Not on file  Tobacco Use  . Smoking status: Current Some Day Smoker    Packs/day: 0.50    Years: 2.00    Pack years: 1.00    Types: Cigarettes  . Smokeless tobacco: Never Used  Substance and Sexual Activity  . Alcohol use: Not Currently    Comment: rare  . Drug use: No  . Sexual activity: Never    Birth control/protection: None  Lifestyle  . Physical activity:    Days per week: Not on file    Minutes per session:  Not on file  . Stress: Not on file  Relationships  . Social connections:    Talks on phone: Not on file    Gets together: Not on file    Attends religious service: Not on file    Active member of club or organization: Not on file    Attends meetings of clubs or organizations: Not on file    Relationship status: Not on file  . Intimate partner violence:    Fear of current or ex partner: Not on file    Emotionally abused: Not on file    Physically abused: Not on file    Forced sexual activity: Not on file  Other Topics Concern  . Not on file  Social History Narrative  . Not on file   Past Surgical History:  Procedure Laterality Date  . THYROIDECTOMY N/A 07/01/2017   Procedure: TOTAL THYROIDECTOMY;  Surgeon: [name], MD;  Location: [facility];  Service: General;  Laterality: N/A;  . TONSILLECTOMY AND ADENOIDECTOMY Bilateral 01/2011  . TOTAL THYROIDECTOMY     Dr. [name] 07-01-17   Family History  Problem Relation Age of Onset  . Heart attack Maternal Grandmother   . Heart Problems Maternal Grandfather   . Heart attack Maternal Grandfather   .  Cancer Paternal Grandmother   . Cancer Paternal Grandfather   . Diabetes Mother   . Other Mother        gastropresis  . Anxiety disorder Mother   . Other Father        syncope-has a pacemaker  . COPD Father     Current Outpatient Medications:  .  acetaminophen (MIDOL) 650 MG CR tablet, Take 650 mg by mouth every 8 (eight) hours as needed (for menstrual pain/cramps)., Disp: , Rfl:  .  calcium carbonate (TUMS) 500 MG chewable tablet, Chew 2 tablets (400 mg of elemental calcium total) by mouth 2 (two) times daily., Disp: 90 tablet, Rfl: 1 .  cyclobenzaprine (FLEXERIL) 5 MG tablet, Take 1 tablet (5 mg total) by mouth 3 (three) times daily as needed for muscle spasms., Disp: 30 tablet, Rfl: 0 .  escitalopram (LEXAPRO) 20 MG tablet, Take 1 tablet (20 mg total) by mouth daily., Disp: 90 tablet, Rfl: 1 .  ibuprofen (ADVIL,MOTRIN) 200 MG  tablet, Take 600-800 mg by mouth every 8 (eight) hours as needed (for pain/headache)., Disp: , Rfl:  .  levothyroxine (SYNTHROID, LEVOTHROID) 200 MCG tablet, Take 1 tablet daily in am. Total daily dose 250 mcg daily., Disp: 45 tablet, Rfl: 3 .  levothyroxine (SYNTHROID, LEVOTHROID) 50 MCG tablet, Take 1 tablet (50 mcg total) by mouth daily. Total daily dose 250 mcg daily., Disp: 45 tablet, Rfl: 3 .  lidocaine (LIDODERM) 5 %, Place 1 patch onto the skin daily. Remove & Discard patch within 12 hours or as directed by MD, Disp: 30 patch, Rfl: 0 .  traZODone (DESYREL) 50 MG tablet, TAKE 0.5-1 TABLETS (25-50 MG TOTAL) BY MOUTH AT BEDTIME AS NEEDED FOR SLEEP., Disp: 90 tablet, Rfl: 0  No Known Allergies   ROS: Review of Systems Constitutional: negative Eyes: negative Ears, nose, mouth, throat, and face: negative Respiratory: negative Cardiovascular: negative Gastrointestinal: negative Genitourinary:negative Integument/breast: negative Hematologic/lymphatic: negative Musculoskeletal:positive for LE edema Neurological: negative Behavioral/Psych: negative Endocrine: positive for hypothyroidism, unplanned weight gain, LE edema Allergic/Immunologic: negative    Physical exam BP 129/89   Pulse 89   Temp 98.7 F (37.1 C) (Oral)   Ht 5' 6" (1.676 m)   Wt (!) 306 lb (138.8 kg)   LMP 09/15/2017 (Exact Date)   BMI 49.39 kg/m  General appearance: alert, cooperative, appears stated age and moderately obese Head: Normocephalic, without obvious abnormality, atraumatic Eyes: negative findings: lids and lashes normal, conjunctivae and sclerae normal, corneas clear and pupils equal, round, reactive to light and accomodation Ears: normal TM's and external ear canals both ears Nose: Nares normal. Septum midline. Mucosa normal. No drainage or sinus tenderness. Throat: lips, mucosa, and tongue normal; teeth and gums normal Neck: no adenopathy and supple, symmetrical, trachea midline Back: symmetric, no  curvature. ROM normal. No CVA tenderness. Lungs: clear to auscultation bilaterally Breasts: normal appearance, no masses or tenderness Heart: regular rate and rhythm, S1, S2 normal, no murmur, click, rub or gallop Abdomen: soft, non-tender; bowel sounds normal; no masses,  no organomegaly Pelvic: cervix normal in appearance, external genitalia normal, no adnexal masses or tenderness, no cervical motion tenderness, rectovaginal septum normal, uterus normal size, shape, and consistency and vagina normal without discharge Extremities: extremities normal, atraumatic, no cyanosis or edema Pulses: 2+ and symmetric Skin: Skin color, texture, turgor normal. No rashes or lesions Lymph nodes: Cervical, supraclavicular, and axillary nodes normal. Neurologic: Alert and oriented X 3, normal strength and tone. Normal symmetric reflexes. Normal coordination and gait  Assessment/ Plan: [name] here for annual physical exam.   1. Well woman exam with routine gynecological exam Pap performed.   2. Screening for malignant neoplasm of cervix - IGP, CtNg, rfx Aptima HPV ASCU  3. Morbid obesity with BMI of 45.0-49.9, adult ([place])  4. Weight gain Likely related to fluid retention.  However, difficult to tell if this is related to dietary indiscretions versus underlying metabolic causes.  She had elevation in her LFTs 2 weeks ago.  We will recheck these today.  I have also added thyroid panel and CBC.  Could consider at some point evaluation for autoimmune disease like lupus if work-up is negative. - Thyroid Panel With TSH - CBC with Differential - CMP14+EGFR  5. Edema, lower extremity Uncertain etiology - Thyroid Panel With TSH - CBC with Differential - CMP14+EGFR  6. Postoperative hypothyroidism - Thyroid Panel With TSH  7. Need for influenza vaccination Influenza vaccine administered today.   Counseled on healthy lifestyle choices, including diet (rich in fruits, vegetables and lean  meats and low in salt and simple carbohydrates) and exercise (at least 30 minutes of moderate physical activity daily).   Ashly M. Lajuana Ripple, DO

## 2017-10-04 LAB — CBC WITH DIFFERENTIAL/PLATELET
Basophils Absolute: 0 10*3/uL (ref 0.0–0.2)
Basos: 0 %
EOS (ABSOLUTE): 0.1 10*3/uL (ref 0.0–0.4)
Eos: 1 %
Hematocrit: 35.6 % (ref 34.0–46.6)
Hemoglobin: 11.9 g/dL (ref 11.1–15.9)
Immature Grans (Abs): 0 10*3/uL (ref 0.0–0.1)
Immature Granulocytes: 0 %
Lymphocytes Absolute: 3.1 10*3/uL (ref 0.7–3.1)
Lymphs: 33 %
MCH: 31 pg (ref 26.6–33.0)
MCHC: 33.4 g/dL (ref 31.5–35.7)
MCV: 93 fL (ref 79–97)
Monocytes Absolute: 0.5 10*3/uL (ref 0.1–0.9)
Monocytes: 6 %
Neutrophils Absolute: 5.8 10*3/uL (ref 1.4–7.0)
Neutrophils: 60 %
Platelets: 297 10*3/uL (ref 150–450)
RBC: 3.84 x10E6/uL (ref 3.77–5.28)
RDW: 15.8 % — ABNORMAL HIGH (ref 12.3–15.4)
WBC: 9.6 10*3/uL (ref 3.4–10.8)

## 2017-10-04 LAB — CMP14+EGFR
ALT: 23 IU/L (ref 0–32)
AST: 22 IU/L (ref 0–40)
Albumin/Globulin Ratio: 1.4 (ref 1.2–2.2)
Albumin: 4.2 g/dL (ref 3.5–5.5)
Alkaline Phosphatase: 92 IU/L (ref 39–117)
BUN/Creatinine Ratio: 14 (ref 9–23)
BUN: 13 mg/dL (ref 6–20)
Bilirubin Total: 0.5 mg/dL (ref 0.0–1.2)
CO2: 21 mmol/L (ref 20–29)
Calcium: 8.9 mg/dL (ref 8.7–10.2)
Chloride: 102 mmol/L (ref 96–106)
Creatinine, Ser: 0.96 mg/dL (ref 0.57–1.00)
GFR calc Af Amer: 96 mL/min/{1.73_m2} (ref >59)
GFR calc non Af Amer: 84 mL/min/{1.73_m2} (ref >59)
Globulin, Total: 2.9 g/dL (ref 1.5–4.5)
Glucose: 78 mg/dL (ref 65–99)
Potassium: 4 mmol/L (ref 3.5–5.2)
Sodium: 142 mmol/L (ref 134–144)
Total Protein: 7.1 g/dL (ref 6.0–8.5)

## 2017-10-04 LAB — THYROID PANEL WITH TSH
Free Thyroxine Index: 0.4 — ABNORMAL LOW (ref 1.2–4.9)
T3 Uptake Ratio: 18 % — ABNORMAL LOW (ref 24–39)
T4, Total: 2.2 ug/dL — ABNORMAL LOW (ref 4.5–12.0)
TSH: 209.2 u[IU]/mL — ABNORMAL HIGH (ref 0.450–4.500)

## 2017-10-06 ENCOUNTER — Telehealth: Payer: Self-pay | Admitting: Internal Medicine

## 2017-10-06 ENCOUNTER — Other Ambulatory Visit: Payer: Self-pay | Admitting: Internal Medicine

## 2017-10-06 MED ORDER — SYNTHROID 300 MCG PO TABS: 300.0000 ug | ORAL_TABLET | Freq: Every day | ORAL | 3 refills | Status: DC

## 2017-10-06 NOTE — Telephone Encounter (Signed)
Pts mom calling because she states the medication she is on is not working, pt is swelling terribly, kidneys are failing. Patient has not called to tell us this that is why the mom is doing it today. Her PCP MD stated to her she needed a different medication.

## 2017-10-06 NOTE — Telephone Encounter (Signed)
Yes, with that TSH, she will be retaining water >> this should improve when the TSH improves

## 2017-10-06 NOTE — Telephone Encounter (Signed)
Notified patient mother of message from Dr. Elvera Lennox, expressed understanding and agreement. No further questions.

## 2017-10-06 NOTE — Telephone Encounter (Signed)
Spoke to patient's mother and she clarified it was not her kidney function they were calling about, it is her liver function and retaining fluid, states she has gained about 20 pounds

## 2017-10-06 NOTE — Telephone Encounter (Signed)
Please advise 

## 2017-10-06 NOTE — Telephone Encounter (Signed)
Her kidneys are not failing.  Kidney function was normal on the labs from 2 days ago. However, her TSH was 208.  This means that she is not taking the levothyroxine or she is not absorbing it. I will send a prescription for Synthroid brand name 300 mcg daily (200 + 100 mcg).  We need labs in 5 weeks.  Can she come to have them here?

## 2017-10-07 ENCOUNTER — Other Ambulatory Visit: Payer: Self-pay | Admitting: Family Medicine

## 2017-10-07 DIAGNOSIS — E063 Autoimmune thyroiditis: Secondary | ICD-10-CM

## 2017-10-08 LAB — IGP, CTNG, RFX APTIMA HPV ASCU
Chlamydia, Nuc. Acid Amp: NEGATIVE
Gonococcus by Nucleic Acid Amp: NEGATIVE
PAP Smear Comment: 0

## 2017-10-09 ENCOUNTER — Other Ambulatory Visit (INDEPENDENT_AMBULATORY_CARE_PROVIDER_SITE_OTHER): Payer: Self-pay | Admitting: *Deleted

## 2017-10-09 DIAGNOSIS — R7989 Other specified abnormal findings of blood chemistry: Secondary | ICD-10-CM

## 2017-10-09 DIAGNOSIS — R945 Abnormal results of liver function studies: Principal | ICD-10-CM

## 2017-10-16 ENCOUNTER — Ambulatory Visit: Payer: BLUE CROSS/BLUE SHIELD | Admitting: Internal Medicine

## 2017-10-22 DIAGNOSIS — E890 Postprocedural hypothyroidism: Secondary | ICD-10-CM | POA: Diagnosis not present

## 2017-10-22 DIAGNOSIS — E042 Nontoxic multinodular goiter: Secondary | ICD-10-CM | POA: Diagnosis not present

## 2017-10-22 DIAGNOSIS — E039 Hypothyroidism, unspecified: Secondary | ICD-10-CM | POA: Diagnosis not present

## 2017-11-03 ENCOUNTER — Other Ambulatory Visit (INDEPENDENT_AMBULATORY_CARE_PROVIDER_SITE_OTHER): Payer: Self-pay | Admitting: *Deleted

## 2017-11-03 ENCOUNTER — Encounter (INDEPENDENT_AMBULATORY_CARE_PROVIDER_SITE_OTHER): Payer: Self-pay | Admitting: *Deleted

## 2017-11-03 DIAGNOSIS — R945 Abnormal results of liver function studies: Principal | ICD-10-CM

## 2017-11-03 DIAGNOSIS — R7989 Other specified abnormal findings of blood chemistry: Secondary | ICD-10-CM

## 2017-11-14 ENCOUNTER — Ambulatory Visit (INDEPENDENT_AMBULATORY_CARE_PROVIDER_SITE_OTHER): Payer: BLUE CROSS/BLUE SHIELD | Admitting: Family Medicine

## 2017-11-14 ENCOUNTER — Encounter: Payer: Self-pay | Admitting: Family Medicine

## 2017-11-14 VITALS — BP 122/78 | HR 77 | Temp 98.1°F | Ht 66.0 in | Wt 298.5 lb

## 2017-11-14 DIAGNOSIS — M7989 Other specified soft tissue disorders: Secondary | ICD-10-CM

## 2017-11-14 DIAGNOSIS — E063 Autoimmune thyroiditis: Secondary | ICD-10-CM | POA: Diagnosis not present

## 2017-11-14 NOTE — Patient Instructions (Signed)
Hypothyroidism Hypothyroidism is a disorder of the thyroid. The thyroid is a large gland that is located in the lower front of the neck. The thyroid releases hormones that control how the body works. With hypothyroidism, the thyroid does not make enough of these hormones. What are the causes? Causes of hypothyroidism may include:  Viral infections.  Pregnancy.  Your own defense system (immune system) attacking your thyroid.  Certain medicines.  Birth defects.  Past radiation treatments to your head or neck.  Past treatment with radioactive iodine.  Past surgical removal of part or all of your thyroid.  Problems with the gland that is located in the center of your brain (pituitary).  What are the signs or symptoms? Signs and symptoms of hypothyroidism may include:  Feeling as though you have no energy (lethargy).  Inability to tolerate cold.  Weight gain that is not explained by a change in diet or exercise habits.  Dry skin.  Coarse hair.  Menstrual irregularity.  Slowing of thought processes.  Constipation.  Sadness or depression.  How is this diagnosed? Your health care provider may diagnose hypothyroidism with blood tests and ultrasound tests. How is this treated? Hypothyroidism is treated with medicine that replaces the hormones that your body does not make. After you begin treatment, it may take several weeks for symptoms to go away. Follow these instructions at home:  Take medicines only as directed by your health care provider.  If you start taking any new medicines, tell your health care provider.  Keep all follow-up visits as directed by your health care provider. This is important. As your condition improves, your dosage needs may change. You will need to have blood tests regularly so that your health care provider can watch your condition. Contact a health care provider if:  Your symptoms do not get better with treatment.  You are taking thyroid  replacement medicine and: ? You sweat excessively. ? You have tremors. ? You feel anxious. ? You lose weight rapidly. ? You cannot tolerate heat. ? You have emotional swings. ? You have diarrhea. ? You feel weak. Get help right away if:  You develop chest pain.  You develop an irregular heartbeat.  You develop a rapid heartbeat. This information is not intended to replace advice given to you by your health care provider. Make sure you discuss any questions you have with your health care provider. Document Released: 12/24/2004 Document Revised: 06/01/2015 Document Reviewed: 05/11/2013 Elsevier Interactive Patient Education  2018 [employer]. Edema Edema is when you have too much fluid in your body or under your skin. Edema may make your legs, feet, and ankles swell up. Swelling is also common in looser tissues, like around your eyes. This is a common condition. It gets more common as you get older. There are many possible causes of edema. Eating too much salt (sodium) and being on your feet or sitting for a long time can cause edema in your legs, feet, and ankles. Hot weather may make edema worse. Edema is usually painless. Your skin may look swollen or shiny. Follow these instructions at home:  Keep the swollen body part raised (elevated) above the level of your heart when you are sitting or lying down.  Do not sit still or stand for a long time.  Do not wear tight clothes. Do not wear garters on your upper legs.  Exercise your legs. This can help the swelling go down.  Wear elastic bandages or support stockings as told by your  doctor.  Eat a low-salt (low-sodium) diet to reduce fluid as told by your doctor.  Depending on the cause of your swelling, you may need to limit how much fluid you drink (fluid restriction).  Take over-the-counter and prescription medicines only as told by your doctor. Contact a doctor if:  Treatment is not working.  You have heart, liver, or  kidney disease and have symptoms of edema.  You have sudden and unexplained weight gain. Get help right away if:  You have shortness of breath or chest pain.  You cannot breathe when you lie down.  You have pain, redness, or warmth in the swollen areas.  You have heart, liver, or kidney disease and get edema all of a sudden.  You have a fever and your symptoms get worse all of a sudden. Summary  Edema is when you have too much fluid in your body or under your skin.  Edema may make your legs, feet, and ankles swell up. Swelling is also common in looser tissues, like around your eyes.  Raise (elevate) the swollen body part above the level of your heart when you are sitting or lying down.  Follow your doctor's instructions about diet and how much fluid you can drink (fluid restriction). This information is not intended to replace advice given to you by your health care provider. Make sure you discuss any questions you have with your health care provider. Document Released: 06/12/2007 Document Revised: 01/12/2016 Document Reviewed: 01/12/2016 Elsevier Interactive Patient Education  2017 [employer].

## 2017-11-14 NOTE — Progress Notes (Signed)
Subjective:    Patient ID: Janet [name], female    DOB: [birth year], 22 y.o.   MRN: [id number]  Chief Complaint:  Edema in hands, feet and ankles (reports since she had thyroidectomy she has been retaining fluid)   HPI: Janet [name] is a 22 y.o. female presenting on 11/14/2017 for Edema in hands, feet and ankles (reports since she had thyroidectomy she has been retaining fluid)  Pt presents today with ongoing edema since thyroidectomy. Pts latest TSH was 209 and her endocronologist increased her synthroid and added cytomel 2 weeks ago. Pt reports she continues to have swelling to her face, hands, and feet. Pt denies chest pain, shortness of breath, palpitations, orthopnea, or cough. She reports she voids 3-4 times per day.   Relevant past medical, surgical, family, and social history reviewed and updated as indicated.  Allergies and medications reviewed and updated.   Past Medical History:  Diagnosis Date  . Allergy   . Anxiety   . GERD (gastroesophageal reflux disease)   . Headache(784.0)   . 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  . Thyroid disease    Hashimotos     Past Surgical History:  Procedure Laterality Date  . THYROIDECTOMY N/A 07/01/2017   Procedure: TOTAL THYROIDECTOMY;  Surgeon: [name], MD;  Location: [facility];  Service: General;  Laterality: N/A;  . TONSILLECTOMY AND ADENOIDECTOMY Bilateral 01/2011  . TOTAL THYROIDECTOMY     Dr. [name] 07-01-17    Social History   Socioeconomic History  . Marital status: Single    Spouse name: Not on file  . Number of children: Not on file  . Years of education: Not on file  . Highest education level: Not on file  Occupational History  . Occupation: Scientist, water quality    Comment: Therapist, art  Social Needs  . Financial resource strain: Not on file  . Food insecurity:    Worry: Not on file    Inability: Not on file  . Transportation needs:    Medical: Not on file    Non-medical: Not on file  Tobacco Use  .  Smoking status: Current Some Day Smoker    Packs/day: 0.50    Years: 2.00    Pack years: 1.00    Types: Cigarettes  . Smokeless tobacco: Never Used  Substance and Sexual Activity  . Alcohol use: Not Currently    Comment: rare  . Drug use: No  . Sexual activity: Never    Birth control/protection: None  Lifestyle  . Physical activity:    Days per week: Not on file    Minutes per session: Not on file  . Stress: Not on file  Relationships  . Social connections:    Talks on phone: Not on file    Gets together: Not on file    Attends religious service: Not on file    Active member of club or organization: Not on file    Attends meetings of clubs or organizations: Not on file    Relationship status: Not on file  . Intimate partner violence:    Fear of current or ex partner: Not on file    Emotionally abused: Not on file    Physically abused: Not on file    Forced sexual activity: Not on file  Other Topics Concern  . Not on file  Social History Narrative  . Not on file    Outpatient Encounter Medications as of 11/14/2017  Medication Sig  . acetaminophen (  MIDOL) 650 MG CR tablet Take 650 mg by mouth every 8 (eight) hours as needed (for menstrual pain/cramps).  . calcium carbonate (TUMS) 500 MG chewable tablet Chew 2 tablets (400 mg of elemental calcium total) by mouth 2 (two) times daily.  . cyclobenzaprine (FLEXERIL) 5 MG tablet Take 1 tablet (5 mg total) by mouth 3 (three) times daily as needed for muscle spasms.  Marland Kitchen escitalopram (LEXAPRO) 20 MG tablet Take 1 tablet (20 mg total) by mouth daily.  Marland Kitchen ibuprofen (ADVIL,MOTRIN) 200 MG tablet Take 600-800 mg by mouth every 8 (eight) hours as needed (for pain/headache).  . lidocaine (LIDODERM) 5 % Place 1 patch onto the skin daily. Remove & Discard patch within 12 hours or as directed by MD  . SYNTHROID 300 MCG tablet Take 1 tablet (300 mcg total) by mouth daily before breakfast.  . traZODone (DESYREL) 50 MG tablet TAKE 0.5-1 TABLETS  (25-50 MG TOTAL) BY MOUTH AT BEDTIME AS NEEDED FOR SLEEP.   No facility-administered encounter medications on file as of 11/14/2017.     No Known Allergies  Review of Systems  Constitutional: Positive for fatigue. Negative for chills and fever.  Respiratory: Negative for cough, choking, chest tightness and shortness of breath.   Cardiovascular: Positive for leg swelling. Negative for chest pain and palpitations.  Genitourinary: Positive for decreased urine volume. Negative for dysuria.  Neurological: Negative for dizziness, weakness, light-headedness, numbness and headaches.  Psychiatric/Behavioral: Negative for confusion.  All other systems reviewed and are negative.       Objective:    BP 122/78   Pulse 77   Temp 98.1 F (36.7 C) (Oral)   Ht 5' 6" (1.676 m)   Wt 298 lb 8 oz (135.4 kg)   BMI 48.18 kg/m    Wt Readings from Last 3 Encounters:  11/14/17 298 lb 8 oz (135.4 kg)  10/03/17 (!) 306 lb (138.8 kg)  09/18/17 (!) 306 lb 6.4 oz (139 kg)    Physical Exam  Constitutional: She is oriented to person, place, and time. She appears well-developed and well-nourished. She is cooperative. No distress.  HENT:  Head: Normocephalic.  Eyes: Pupils are equal, round, and reactive to light. Conjunctivae and EOM are normal.  Neck: Trachea normal and phonation normal. No JVD present. No tracheal tenderness present.  Healed thyroidectomy scar, no erythema, swelling, or drainage.   Cardiovascular: Normal rate, regular rhythm, normal heart sounds and intact distal pulses. Exam reveals no [name], no S3 and no friction rub.  No murmur heard. Pulmonary/Chest: Effort normal and breath sounds normal. No respiratory distress. She has no decreased breath sounds. She has no wheezes. She has no rhonchi. She has no rales.  Neurological: She is alert and oriented to person, place, and time. No sensory deficit.  Skin: Skin is warm and dry. Capillary refill takes less than 2 seconds.  1+ nonpitting  edema to bilateral lower extremities and bilateral hands. Facial puffiness.   Psychiatric: She has a normal mood and affect. Her behavior is normal. Judgment and thought content normal.  Nursing note and vitals reviewed.   Results for orders placed or performed in visit on 10/03/17  Thyroid Panel With TSH  Result Value Ref Range   TSH 209.200 (H) 0.450 - 4.500 uIU/mL   T4, Total 2.2 (L) 4.5 - 12.0 ug/dL   T3 Uptake Ratio 18 (L) 24 - 39 %   Free Thyroxine Index 0.4 (L) 1.2 - 4.9  CBC with Differential  Result Value Ref Range  WBC 9.6 3.4 - 10.8 x10E3/uL   RBC 3.84 3.77 - 5.28 x10E6/uL   Hemoglobin 11.9 11.1 - 15.9 g/dL   Hematocrit 35.6 34.0 - 46.6 %   MCV 93 79 - 97 fL   MCH 31.0 26.6 - 33.0 pg   MCHC 33.4 31.5 - 35.7 g/dL   RDW 15.8 (H) 12.3 - 15.4 %   Platelets 297 150 - 450 x10E3/uL   Neutrophils 60 Not Estab. %   Lymphs 33 Not Estab. %   Monocytes 6 Not Estab. %   Eos 1 Not Estab. %   Basos 0 Not Estab. %   Neutrophils Absolute 5.8 1.4 - 7.0 x10E3/uL   Lymphocytes Absolute 3.1 0.7 - 3.1 x10E3/uL   Monocytes Absolute 0.5 0.1 - 0.9 x10E3/uL   EOS (ABSOLUTE) 0.1 0.0 - 0.4 x10E3/uL   Basophils Absolute 0.0 0.0 - 0.2 x10E3/uL   Immature Granulocytes 0 Not Estab. %   Immature Grans (Abs) 0.0 0.0 - 0.1 x10E3/uL  CMP14+EGFR  Result Value Ref Range   Glucose 78 65 - 99 mg/dL   BUN 13 6 - 20 mg/dL   Creatinine, Ser 0.96 0.57 - 1.00 mg/dL   GFR calc non Af Amer 84 >59 mL/min/1.73   GFR calc Af Amer 96 >59 mL/min/1.73   BUN/Creatinine Ratio 14 9 - 23   Sodium 142 134 - 144 mmol/L   Potassium 4.0 3.5 - 5.2 mmol/L   Chloride 102 96 - 106 mmol/L   CO2 21 20 - 29 mmol/L   Calcium 8.9 8.7 - 10.2 mg/dL   Total Protein 7.1 6.0 - 8.5 g/dL   Albumin 4.2 3.5 - 5.5 g/dL   Globulin, Total 2.9 1.5 - 4.5 g/dL   Albumin/Globulin Ratio 1.4 1.2 - 2.2   Bilirubin Total 0.5 0.0 - 1.2 mg/dL   Alkaline Phosphatase 92 39 - 117 IU/L   AST 22 0 - 40 IU/L   ALT 23 0 - 32 IU/L  IGP, CtNg, rfx  Aptima HPV ASCU  Result Value Ref Range   DIAGNOSIS: Comment    Specimen adequacy: Comment    Clinician Provided ICD10 Comment    Performed by: Comment    QC reviewed by: Comment    PAP Smear Comment .    Note: Comment    Test Methodology Comment    PAP Reflex Comment    Chlamydia, Nuc. Acid Amp Negative Negative   Gonococcus by Nucleic Acid Amp Negative Negative     Consulted with Dr. Lajuana Ripple and she agrees with treatment plan.   Pertinent labs & imaging results that were available during my care of the patient were reviewed by me and considered in my medical decision making.  Assessment & Plan:  Janet [name] was seen today for edema in hands, feet and ankles.  Diagnoses and all orders for this visit:  Hashimoto's thyroiditis Followed by Dr. Garnet Koyanagi in [place], has an appointment on 11/25/17 to have TSH rechecked. Proper medication administration discussed along with avoiding supplements, milk, and sweet tea for at least 4 hours after taking synthroid.   Bilateral hand swelling Nonpitting. Will recheck kidney function today. Limit sodium intake and increase water intake. If swelling persists after TSH is regulated, will consider diuretic. -     CMP14+EGFR  Foot swelling Nonpitting. Will recheck kidney function today. Limit sodium intake and increase water intake. If swelling persists after TSH is regulated, will consider diuretic. -     CMP14+EGFR      Continue all other maintenance medications.  Follow up plan: Return in about 3 months (around 02/14/2018), or if symptoms worsen or fail to improve.  Educational handout given for edema, hypothyroidism   The above assessment and management plan was discussed with the patient. The patient verbalized understanding of and has agreed to the management plan. Patient is aware to call the clinic if symptoms persist or worsen. Patient is aware when to return to the clinic for a follow-up visit. Patient educated on when it is  appropriate to go to the emergency department.   Monia Pouch, FNP-C [facility] [phone]

## 2017-11-15 LAB — CMP14+EGFR
ALT: 12 IU/L (ref 0–32)
AST: 9 IU/L (ref 0–40)
Albumin/Globulin Ratio: 1.4 (ref 1.2–2.2)
Albumin: 3.9 g/dL (ref 3.5–5.5)
Alkaline Phosphatase: 82 IU/L (ref 39–117)
BUN/Creatinine Ratio: 18 (ref 9–23)
BUN: 14 mg/dL (ref 6–20)
Bilirubin Total: 0.3 mg/dL (ref 0.0–1.2)
CO2: 21 mmol/L (ref 20–29)
Calcium: 8.6 mg/dL — ABNORMAL LOW (ref 8.7–10.2)
Chloride: 104 mmol/L (ref 96–106)
Creatinine, Ser: 0.76 mg/dL (ref 0.57–1.00)
GFR calc Af Amer: 128 mL/min/{1.73_m2} (ref >59)
GFR calc non Af Amer: 111 mL/min/{1.73_m2} (ref >59)
Globulin, Total: 2.7 g/dL (ref 1.5–4.5)
Glucose: 83 mg/dL (ref 65–99)
Potassium: 4.3 mmol/L (ref 3.5–5.2)
Sodium: 139 mmol/L (ref 134–144)
Total Protein: 6.6 g/dL (ref 6.0–8.5)

## 2017-11-20 DIAGNOSIS — E039 Hypothyroidism, unspecified: Secondary | ICD-10-CM | POA: Diagnosis not present

## 2017-11-24 DIAGNOSIS — E039 Hypothyroidism, unspecified: Secondary | ICD-10-CM | POA: Diagnosis not present

## 2017-11-24 DIAGNOSIS — E042 Nontoxic multinodular goiter: Secondary | ICD-10-CM | POA: Diagnosis not present

## 2017-11-24 DIAGNOSIS — E890 Postprocedural hypothyroidism: Secondary | ICD-10-CM | POA: Diagnosis not present

## 2017-11-24 DIAGNOSIS — R609 Edema, unspecified: Secondary | ICD-10-CM | POA: Diagnosis not present

## 2017-11-28 ENCOUNTER — Ambulatory Visit (INDEPENDENT_AMBULATORY_CARE_PROVIDER_SITE_OTHER): Payer: BLUE CROSS/BLUE SHIELD | Admitting: Family Medicine

## 2017-11-28 ENCOUNTER — Encounter: Payer: Self-pay | Admitting: Family Medicine

## 2017-11-28 ENCOUNTER — Other Ambulatory Visit: Payer: Self-pay | Admitting: Family Medicine

## 2017-11-28 VITALS — BP 124/77 | HR 73 | Temp 99.0°F | Ht 66.0 in | Wt 295.0 lb

## 2017-11-28 DIAGNOSIS — J209 Acute bronchitis, unspecified: Secondary | ICD-10-CM

## 2017-11-28 MED ORDER — BENZONATATE 100 MG PO CAPS: 100.0000 mg | ORAL_CAPSULE | Freq: Three times a day (TID) | ORAL | 0 refills | Status: DC | PRN

## 2017-11-28 MED ORDER — ALBUTEROL SULFATE HFA 108 (90 BASE) MCG/ACT IN AERS: 2.0000 | INHALATION_SPRAY | Freq: Four times a day (QID) | RESPIRATORY_TRACT | 0 refills | Status: DC | PRN

## 2017-11-28 MED ORDER — METHYLPREDNISOLONE ACETATE 80 MG/ML IJ SUSP
40.0000 mg | Freq: Once | INTRAMUSCULAR | Status: AC
  Administered 2017-11-28: 40 mg via INTRAMUSCULAR

## 2017-11-28 MED ORDER — PREDNISONE 20 MG PO TABS: 40.0000 mg | ORAL_TABLET | Freq: Every day | ORAL | 0 refills | Status: AC

## 2017-11-28 NOTE — Progress Notes (Signed)
[phone] ?

## 2017-11-28 NOTE — Progress Notes (Signed)
Subjective: CC: Cough PCP: Janet [name] is a 22 y.o. female presenting to clinic today for:  1. Cough Patient reports a 4-day history of cough, wheeze, nasal congestion and bilateral ear pain.  She describes the cough is productive with yellow sputum.  Symptoms are essentially stable/slightly worsened since it seems to be moving down into her chest.  No fevers but she does endorse diarrhea.  No associated nausea or vomiting.  Her mother was sick recently with bronchitis.   ROS: Per HPI  No Known Allergies Past Medical History:  Diagnosis Date  . Allergy   . Anxiety   . GERD (gastroesophageal reflux disease)   . Headache(784.0)   . 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  . Thyroid disease    [place]     Current Outpatient Medications:  .  acetaminophen (MIDOL) 650 MG CR tablet, Take 650 mg by mouth every 8 (eight) hours as needed (for menstrual pain/cramps)., Disp: , Rfl:  .  calcium carbonate (TUMS) 500 MG chewable tablet, Chew 2 tablets (400 mg of elemental calcium total) by mouth 2 (two) times daily., Disp: 90 tablet, Rfl: 1 .  cyclobenzaprine (FLEXERIL) 5 MG tablet, Take 1 tablet (5 mg total) by mouth 3 (three) times daily as needed for muscle spasms., Disp: 30 tablet, Rfl: 0 .  escitalopram (LEXAPRO) 20 MG tablet, Take 1 tablet (20 mg total) by mouth daily., Disp: 90 tablet, Rfl: 1 .  furosemide (LASIX) 20 MG tablet, 10 mg daily., Disp: , Rfl: 0 .  ibuprofen (ADVIL,MOTRIN) 200 MG tablet, Take 600-800 mg by mouth every 8 (eight) hours as needed (for pain/headache)., Disp: , Rfl:  .  lidocaine (LIDODERM) 5 %, Place 1 patch onto the skin daily. Remove & Discard patch within 12 hours or as directed by MD, Disp: 30 patch, Rfl: 0 .  liothyronine (CYTOMEL) 5 MCG tablet, , Disp: , Rfl: 0 .  potassium chloride (MICRO-K) 10 MEQ CR capsule, 10 mEq daily., Disp: , Rfl: 0 .  SYNTHROID 25 MCG tablet, , Disp: , Rfl: 0 .  SYNTHROID 300 MCG tablet,  Take 1 tablet (300 mcg total) by mouth daily before breakfast., Disp: 45 tablet, Rfl: 3 .  traZODone (DESYREL) 50 MG tablet, TAKE 0.5-1 TABLETS (25-50 MG TOTAL) BY MOUTH AT BEDTIME AS NEEDED FOR SLEEP., Disp: 90 tablet, Rfl: 0 Social History   Socioeconomic History  . Marital status: Single    Spouse name: Not on file  . Number of children: Not on file  . Years of education: Not on file  . Highest education level: Not on file  Occupational History  . Occupation: Conservation officer, nature    Comment: Clinical biochemist  Social Needs  . Financial resource strain: Not on file  . Food insecurity:    Worry: Not on file    Inability: Not on file  . Transportation needs:    Medical: Not on file    Non-medical: Not on file  Tobacco Use  . Smoking status: Current Some Day Smoker    Packs/day: 0.50    Years: 2.00    Pack years: 1.00    Types: Cigarettes  . Smokeless tobacco: Never Used  Substance and Sexual Activity  . Alcohol use: Not Currently    Comment: rare  . Drug use: No  . Sexual activity: Never    Birth control/protection: None  Lifestyle  . Physical activity:    Days per week: Not on file    Minutes  per session: Not on file  . Stress: Not on file  Relationships  . Social connections:    Talks on phone: Not on file    Gets together: Not on file    Attends religious service: Not on file    Active member of club or organization: Not on file    Attends meetings of clubs or organizations: Not on file    Relationship status: Not on file  . Intimate partner violence:    Fear of current or ex partner: Not on file    Emotionally abused: Not on file    Physically abused: Not on file    Forced sexual activity: Not on file  Other Topics Concern  . Not on file  Social History Narrative  . Not on file   Family History  Problem Relation Age of Onset  . Heart attack Maternal Grandmother   . Heart Problems Maternal Grandfather   . Heart attack Maternal Grandfather   . Cancer Paternal  Grandmother   . Cancer Paternal Grandfather   . Diabetes Mother   . Other Mother        gastropresis  . Anxiety disorder Mother   . Other Father        syncope-has a pacemaker  . COPD Father     Objective: Office vital signs reviewed. BP 124/77   Pulse 73   Temp 99 F (37.2 C) (Oral)   Ht 5\' 6"  (1.676 m)   Wt 295 lb (133.8 kg)   SpO2 97%   BMI 47.61 kg/m   Physical Examination:  General: Awake, alert, well nourished, nontoxic, No acute distress HEENT: Normal    Neck: No masses palpated. No lymphadenopathy    Ears: Tympanic membranes intact, normal light reflex, no erythema, no bulging    Eyes: PERRLA, extraocular membranes intact, sclera white    Nose: nasal turbinates moist, clear nasal discharge    Throat: moist mucus membranes, no erythema, no tonsillar exudate.  Airway is patent Cardio: regular rate and rhythm, S1S2 heard, no murmurs appreciated Pulm: Mild expiratory wheezes noted.  No rhonchi or rales; normal work of breathing on room air  Assessment/ Plan: 22 y.o. female   1. Bronchitis with bronchospasm Likely a viral bronchitis with bronchospasm.  Nothing on exam to suggest bacterial infection at this time.  She does have a low-grade fever 96F.  Otherwise, vitals are within normal limits.  She was treated with a dose of Depo-Medrol intramuscularly.  Start prednisone burst tomorrow.  Albuterol inhaler prescribed.  Tessalon Perles prescribed.  We discussed how to use the albuterol inhaler.  Reasons for return discussed.  Work note offered but patient declined, she does not have to return to work till Tuesday.  She will follow-up on a PRN basis - methylPREDNISolone acetate (DEPO-MEDROL) injection 40 mg   No orders of the defined types were placed in this encounter.  Meds ordered this encounter  Medications  . benzonatate (TESSALON PERLES) 100 MG capsule    Sig: Take 1 capsule (100 mg total) by mouth 3 (three) times daily as needed.    Dispense:  20 capsule     Refill:  0  . predniSONE (DELTASONE) 20 MG tablet    Sig: Take 2 tablets (40 mg total) by mouth daily with breakfast for 3 days.    Dispense:  6 tablet    Refill:  0  . methylPREDNISolone acetate (DEPO-MEDROL) injection 40 mg  . albuterol (PROVENTIL HFA;VENTOLIN HFA) 108 (90 Base) MCG/ACT inhaler  Sig: Inhale 2 puffs into the lungs every 6 (six) hours as needed for wheezing or shortness of breath.    Dispense:  1 Inhaler    Refill:  0     Janet [name] Janet Sassano, DO [facility] [phone]

## 2017-11-28 NOTE — Patient Instructions (Signed)
I suspect this is a viral upper respiratory infection.  I am treating you for bronchospasm with prednisone.  You received your first dose via injection today.  Start the oral prednisone tomorrow with breakfast.  You have an albuterol inhaler also sent to the pharmacy.  Use 2 puffs every 6 hours for the next 2 days and only if needed.  Tessalon Perles have been sent for your cough.  You may use these 3 times a day.  Make sure you are getting plenty of fluid and rest.  If you develop any other worrisome symptoms or signs we discussed, please seek immediate medical attention.   How to Use a Metered Dose Inhaler A metered dose inhaler is a handheld device for taking medicine that must be breathed into the lungs (inhaled). The device can be used to deliver a variety of inhaled medicines, including:  Quick relief or rescue medicines, such as bronchodilators.  Controller medicines, such as corticosteroids.  The medicine is delivered by pushing down on a metal canister to release a preset amount of spray and medicine. Each device contains the amount of medicine that is needed for a preset number of uses (inhalations). Your health care provider may recommend that you use a spacer with your inhaler to help you take the medicine more effectively. A spacer is a plastic tube with a mouthpiece on one end and an opening that connects to the inhaler on the other end. A spacer holds the medicine in a tube for a short time, which allows you to inhale more medicine. What are the risks? If you do not use your inhaler correctly, medicine might not reach your lungs to help you breathe. Inhaler medicine can cause side effects, such as:  Mouth or throat infection.  Cough.  Hoarseness.  Headache.  Nausea and vomiting.  Lung infection (pneumonia) in people who have a lung condition called COPD.  How to use a metered dose inhaler without a spacer 1. Remove the cap from the inhaler. 2. If you are using the inhaler  for the first time, shake it for 5 seconds, turn it away from your face, then release 4 puffs into the air. This is called priming. 3. Shake the inhaler for 5 seconds. 4. Position the inhaler so the top of the canister faces up. 5. Put your index finger on the top of the medicine canister. Support the bottom of the inhaler with your thumb. 6. Breathe out normally and as completely as possible, away from the inhaler. 7. Either place the inhaler between your teeth and close your lips tightly around the mouthpiece, or hold the inhaler 1-2 inches (2.5-5 cm) away from your open mouth. Keep your tongue down out of the way. If you are unsure which technique to use, ask your health care provider. 8. Press the canister down with your index finger to release the medicine, then inhale deeply and slowly through your mouth (not your nose) until your lungs are completely filled. Inhaling should take 4-6 seconds. 9. Hold the medicine in your lungs for 5-10 seconds (10 seconds is best). This helps the medicine get into the small airways of your lungs. 10. With your lips in a tight circle (pursed), breathe out slowly. 11. Repeat steps 3-10 until you have taken the number of puffs that your health care provider directed. Wait about 1 minute between puffs or as directed. 12. Put the cap on the inhaler. 13. If you are using a steroid inhaler, rinse your mouth with water,  gargle, and spit out the water. Do not swallow the water. How to use a metered dose inhaler with a spacer 1. Remove the cap from the inhaler. 2. If you are using the inhaler for the first time, shake it for 5 seconds, turn it away from your face, then release 4 puffs into the air. This is called priming. 3. Shake the inhaler for 5 seconds. 4. Place the open end of the spacer onto the inhaler mouthpiece. 5. Position the inhaler so the top of the canister faces up and the spacer mouthpiece faces you. 6. Put your index finger on the top of the medicine  canister. Support the bottom of the inhaler and the spacer with your thumb. 7. Breathe out normally and as completely as possible, away from the spacer. 8. Place the spacer between your teeth and close your lips tightly around it. Keep your tongue down out of the way. 9. Press the canister down with your index finger to release the medicine, then inhale deeply and slowly through your mouth (not your nose) until your lungs are completely filled. Inhaling should take 4-6 seconds. 10. Hold the medicine in your lungs for 5-10 seconds (10 seconds is best). This helps the medicine get into the small airways of your lungs. 11. With your lips in a tight circle (pursed), breathe out slowly. 12. Repeat steps 3-11 until you have taken the number of puffs that your health care provider directed. Wait about 1 minute between puffs or as directed. 13. Remove the spacer from the inhaler and put the cap on the inhaler. 14. If you are using a steroid inhaler, rinse your mouth with water, gargle, and spit out the water. Do not swallow the water. Follow these instructions at home:  Take your inhaled medicine only as told by your health care provider. Do not use the inhaler more than directed by your health care provider.  Keep all follow-up visits as told by your health care provider. This is important.  If your inhaler has a counter, you can check it to determine how full your inhaler is. If your inhaler does not have a counter, ask your health care provider when you will need to refill your inhaler and write the refill date on a calendar or on your inhaler canister. Note that you cannot know when an inhaler is empty by shaking it.  Follow directions on the package insert for care and cleaning of your inhaler and spacer. Contact a health care provider if:  Symptoms are only partially relieved with your inhaler.  You are having trouble using your inhaler.  You have an increase in phlegm.  You have  headaches. Get help right away if:  You feel little or no relief after using your inhaler.  You have dizziness.  You have a fast heart rate.  You have chills or a fever.  You have night sweats.  There is blood in your phlegm. Summary  A metered dose inhaler is a handheld device for taking medicine that must be breathed into the lungs (inhaled).  The medicine is delivered by pushing down on a metal canister to release a preset amount of spray and medicine.  Each device contains the amount of medicine that is needed for a preset number of uses (inhalations). This information is not intended to replace advice given to you by your health care provider. Make sure you discuss any questions you have with your health care provider. Document Released: 12/24/2004 Document Revised: 11/14/2015 Document  Reviewed: 11/14/2015 Elsevier Interactive Patient Education  2017 [employer]  Bronchospasm, Adult Bronchospasm is when airways in the lungs get smaller. When this happens, it can be hard to breathe. You may cough. You may also make a whistling sound when you breathe (wheeze). Follow these instructions at home: Medicines  Take over-the-counter and prescription medicines only as told by your doctor.  If you need to use an inhaler or nebulizer to take your medicine, ask your doctor how to use it.  If you were given a spacer, always use it with your inhaler. Lifestyle  Change your heating and air conditioning filter. Do this at least once a month.  Try not to use fireplaces and wood stoves.  Do not  smoke. Do not  allow smoking in your home.  Try not to use things that have a strong smell, like perfume.  Get rid of pests (such as roaches and mice) and their poop.  Remove any mold from your home.  Keep your house clean. Get rid of dust.  Use cleaning products that have no smell.  Replace carpet with wood, tile, or vinyl flooring.  Use allergy-proof pillows, mattress covers,  and box spring covers.  Wash bed sheets and blankets every week. Use hot water. Dry them in a dryer.  Use blankets that are made of polyester or cotton.  Wash your hands often.  Keep pets out of your bedroom.  When you exercise, try not to breathe in cold air. General instructions  Have a plan for getting medical care. Know these things: ? When to call your doctor. ? When to call local emergency services (911 in the [place] ? Where to go in an emergency.  Stay up to date on your shots (immunizations).  When you have an episode: ? Stay calm. ? Relax. ? Breathe slowly. Contact a doctor if:  Your muscles ache.  Your chest hurts.  The color of the mucus you cough up (sputum) changes from clear or white to yellow, green, gray, or bloody.  The mucus you cough up gets thicker.  You have a fever. Get help right away if:  The whistling sound gets worse, even after you take your medicines.  Your coughing gets worse.  You find it even harder to breathe.  Your chest hurts very much. Summary  Bronchospasm is when airways in the lungs get smaller.  When this happens, it can be hard to breathe. You may cough. You may also make a whistling sound when you breathe.  Stay away from things that cause you to have episodes. These include smoke or dust. This information is not intended to replace advice given to you by your health care provider. Make sure you discuss any questions you have with your health care provider. Document Released: 10/21/2008 Document Revised: 12/28/2015 Document Reviewed: 12/28/2015 Elsevier Interactive Patient Education  2017 [employer].

## 2017-12-22 DIAGNOSIS — E039 Hypothyroidism, unspecified: Secondary | ICD-10-CM | POA: Diagnosis not present

## 2018-01-05 IMAGING — US US ABDOMEN LIMITED
1 series · 14 of 25 positions shown · non-contrast
Comparison: None.

CLINICAL DATA: 19-year-old presenting with a 5 day history of right
upper quadrant abdominal pain, nausea and diarrhea.

EXAM:
US ABDOMEN LIMITED - RIGHT UPPER QUADRANT

[Series 1: us abdomen limited · 0.27mm/px · 14 of 58 slices shown]
[im 1/58]
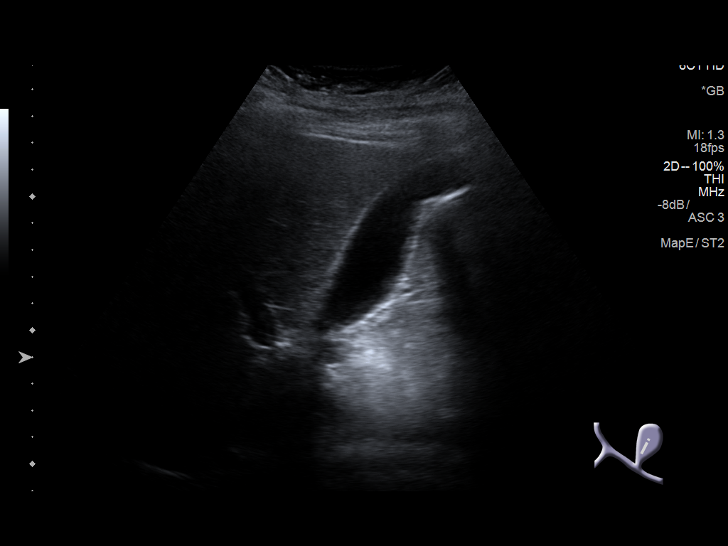
[im 5/58]
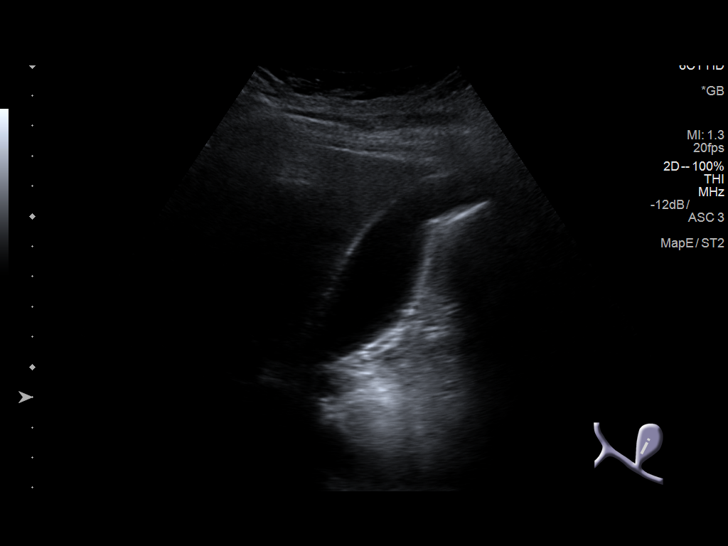
[im 10/58]
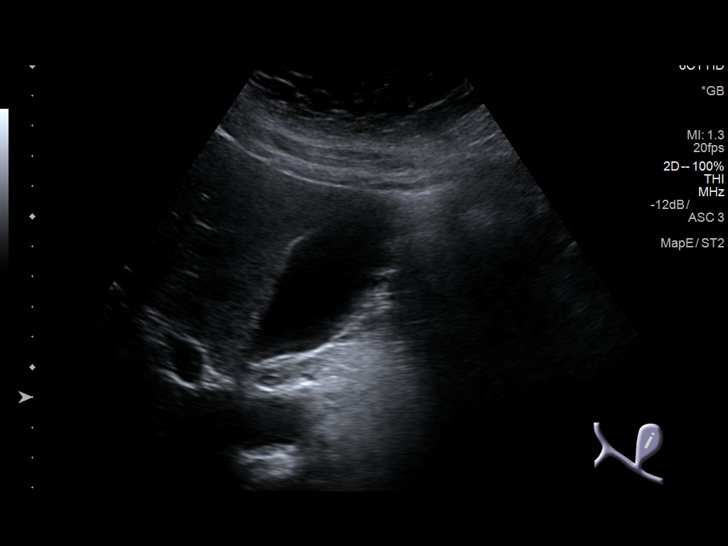
[im 15/58]
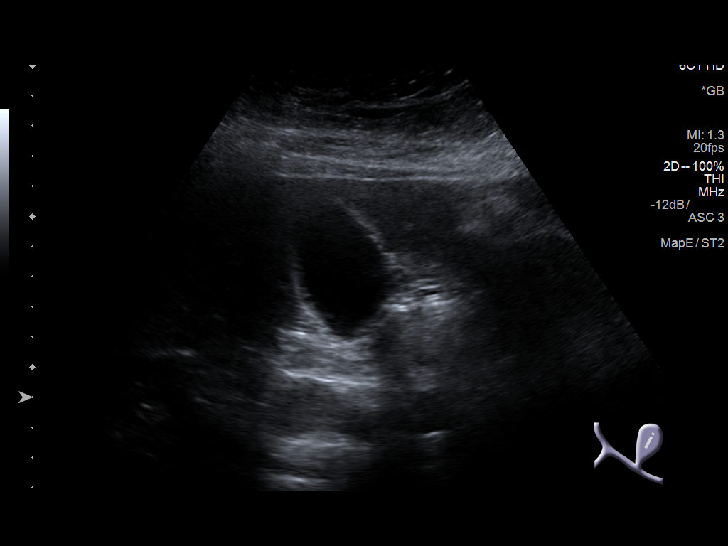
[im 20/58]
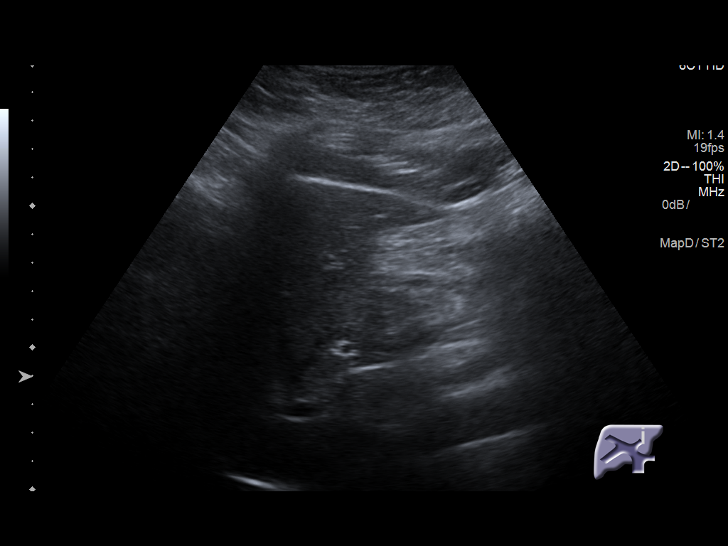
[im 22/58]
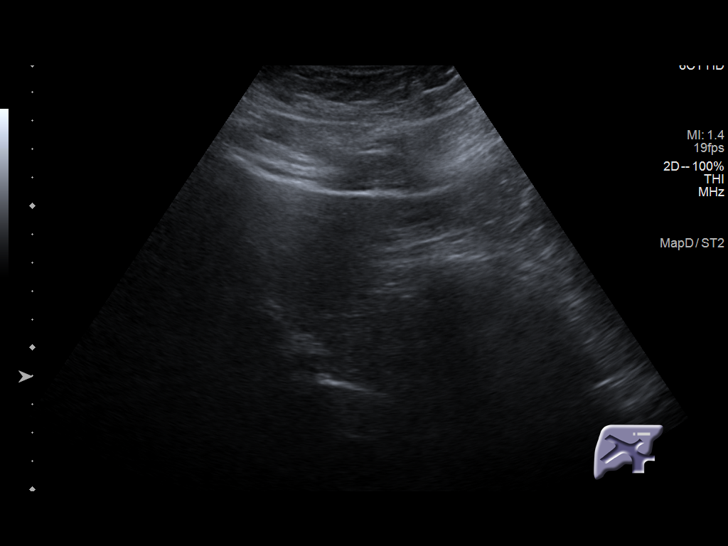
[im 27/58]
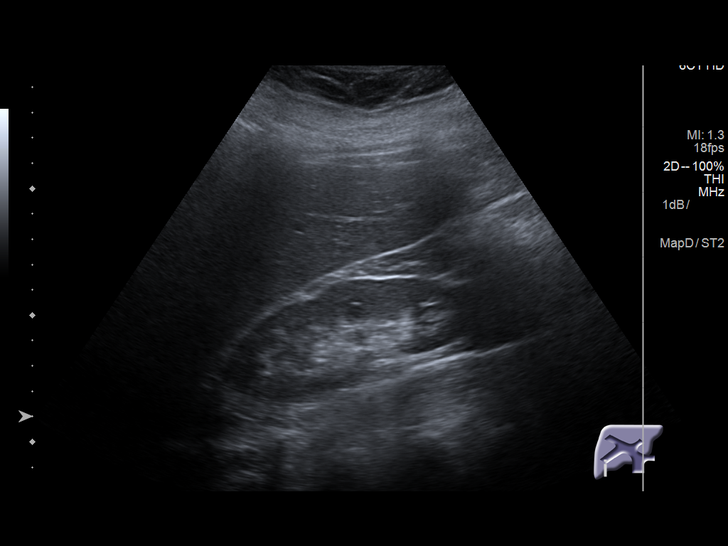
[im 31/58]
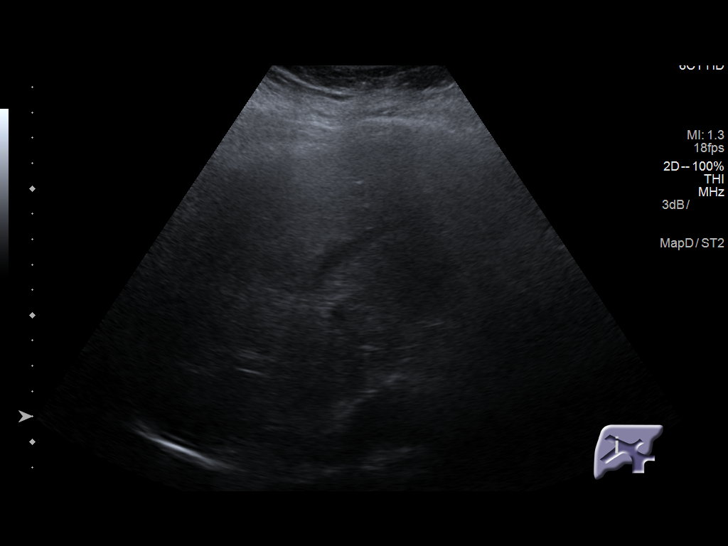
[im 36/58]
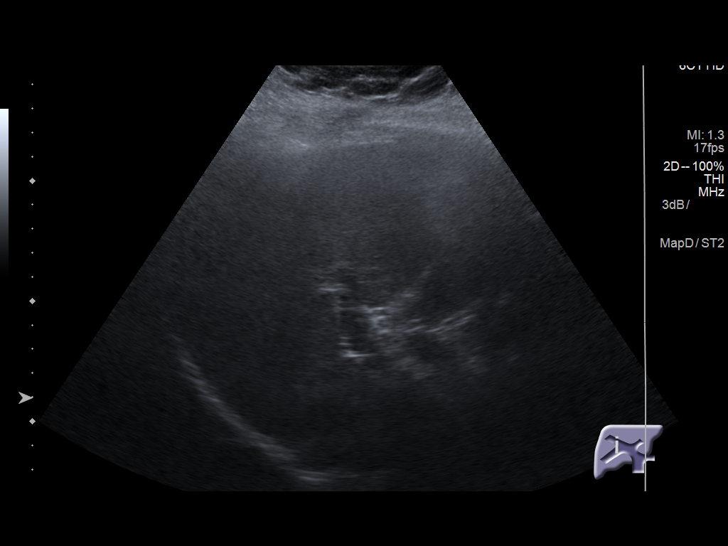
[im 39/58]
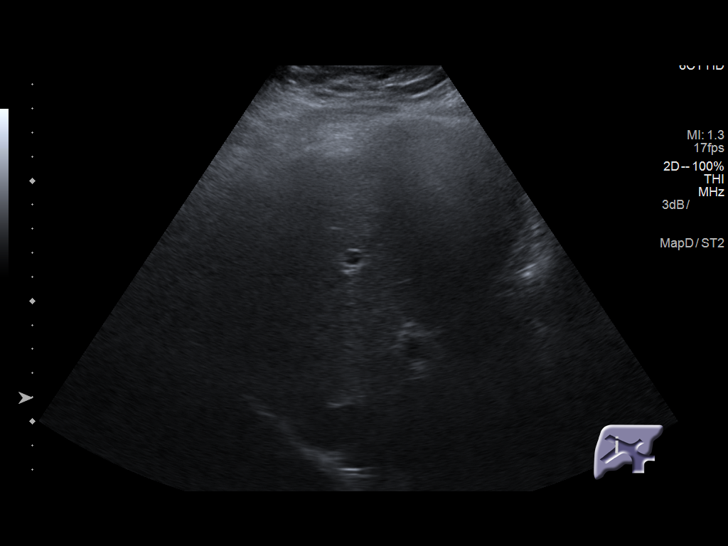
[im 43/58]
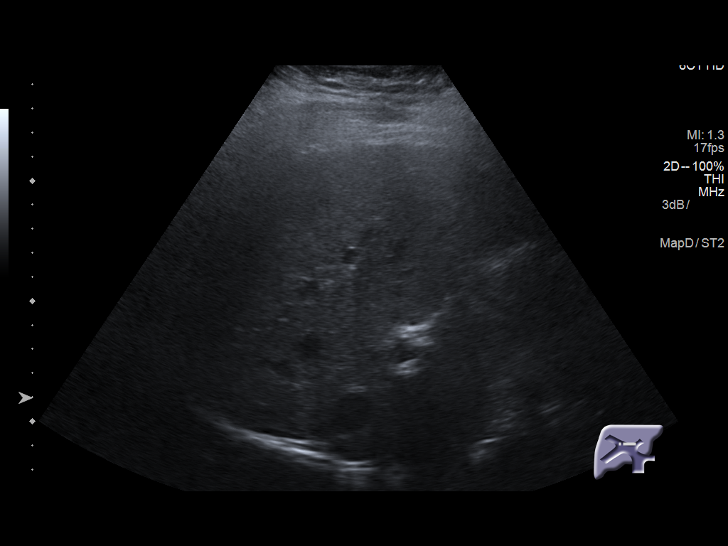
[im 48/58]
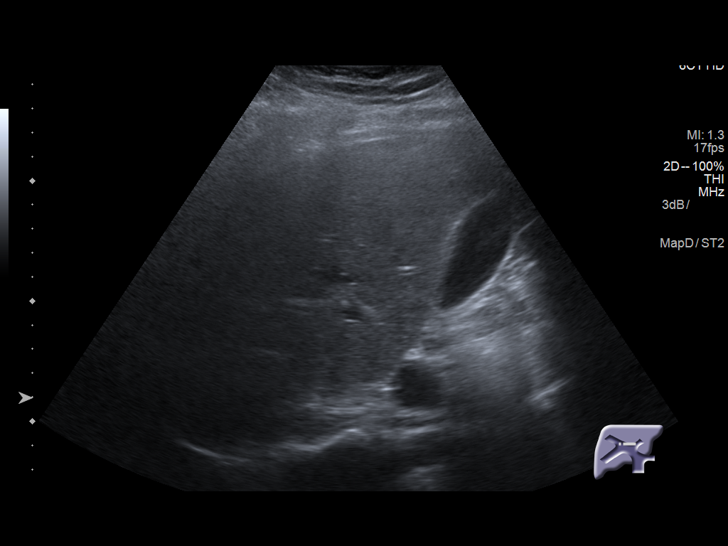
[im 53/58]
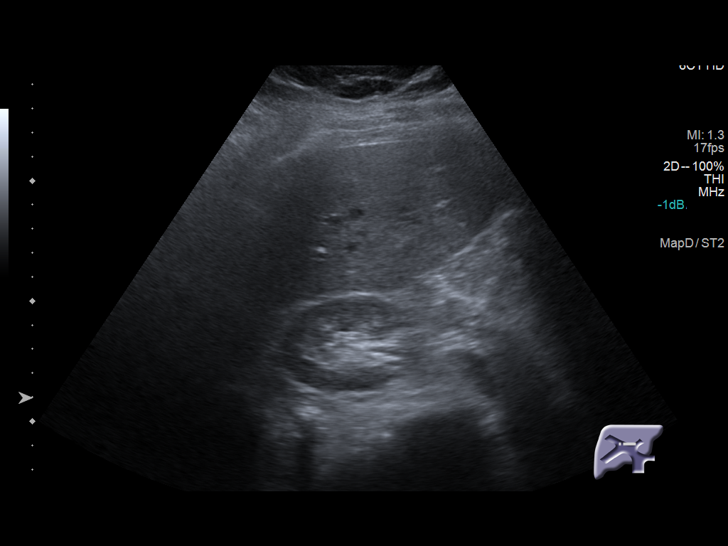
[im 58/58]
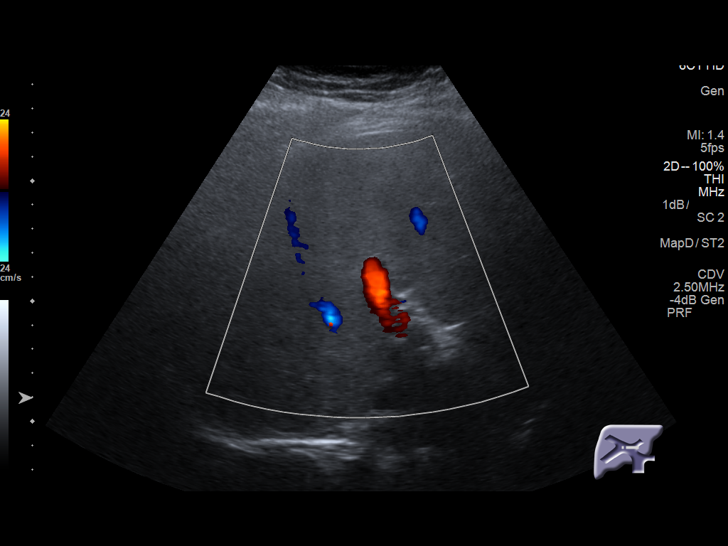

[14 of 25 positions shown; findings below may reference images not displayed]

FINDINGS: Gallbladder:

No shadowing gallstones or echogenic sludge. No gallbladder wall
thickening or pericholecystic fluid. Negative sonographic Murphy
sign according to the ultrasound technologist.

Common bile duct:

Diameter: Approximately 3 mm.

Liver:

Normal size and echotexture without focal parenchymal abnormality.
Patent portal vein with hepatopetal flow.
IMPRESSION: Normal examination.

## 2018-01-12 DIAGNOSIS — E039 Hypothyroidism, unspecified: Secondary | ICD-10-CM | POA: Diagnosis not present

## 2018-01-27 DIAGNOSIS — E890 Postprocedural hypothyroidism: Secondary | ICD-10-CM | POA: Diagnosis not present

## 2018-01-27 DIAGNOSIS — E039 Hypothyroidism, unspecified: Secondary | ICD-10-CM | POA: Diagnosis not present

## 2018-01-27 DIAGNOSIS — R609 Edema, unspecified: Secondary | ICD-10-CM | POA: Diagnosis not present

## 2018-01-27 DIAGNOSIS — E042 Nontoxic multinodular goiter: Secondary | ICD-10-CM | POA: Diagnosis not present

## 2018-02-16 ENCOUNTER — Ambulatory Visit: Payer: BLUE CROSS/BLUE SHIELD | Admitting: Family Medicine

## 2018-02-24 ENCOUNTER — Encounter: Payer: Self-pay | Admitting: Family Medicine

## 2018-02-24 ENCOUNTER — Ambulatory Visit (INDEPENDENT_AMBULATORY_CARE_PROVIDER_SITE_OTHER): Payer: BLUE CROSS/BLUE SHIELD | Admitting: Family Medicine

## 2018-02-24 VITALS — BP 117/75 | HR 81 | Temp 98.2°F | Ht 66.0 in | Wt 295.0 lb

## 2018-02-24 DIAGNOSIS — E063 Autoimmune thyroiditis: Secondary | ICD-10-CM | POA: Diagnosis not present

## 2018-02-24 DIAGNOSIS — G5601 Carpal tunnel syndrome, right upper limb: Secondary | ICD-10-CM

## 2018-02-24 DIAGNOSIS — E038 Other specified hypothyroidism: Secondary | ICD-10-CM

## 2018-02-24 DIAGNOSIS — M25531 Pain in right wrist: Secondary | ICD-10-CM

## 2018-02-24 MED ORDER — NAPROXEN 500 MG PO TABS: 500.0000 mg | ORAL_TABLET | Freq: Two times a day (BID) | ORAL | 0 refills | Status: DC

## 2018-02-24 NOTE — Patient Instructions (Signed)
I have sent in naproxen that you may take up to 2 times daily if needed for pain and inflammation in your wrist.  Please be aware that this can potentially interact with Lexapro and increased risk for stomach bleeds.  Try and take them separately and discontinue the naproxen if you have any symptoms or signs of GI bleed.  You have prescribed a nonsteroidal anti-inflammatory drug (NSAID) today. This will help with your pain and inflammation. Please do not take any other NSAIDs (ibuprofen/Motrin/Advil, naproxen/Aleve, meloxicam/Mobic, Voltaren/diclofenac). Please make sure to eat a meal when taking this medication.   Caution:  If you have a history of acid reflux/indigestion, I recommend that you take an antacid (such as Prilosec, Prevacid) daily while on the NSAID.  If you have a history of bleeding disorder, gastric ulcer, are on a blood thinner (like warfarin/Coumadin, Xarelto, Eliquis, etc) please do not take NSAID.  If you have ever had a heart attack, you should not take NSAIDs.  Carpal Tunnel Syndrome  Carpal tunnel syndrome is a condition that causes pain in your hand and arm. The carpal tunnel is a narrow area that is on the palm side of your wrist. Repeated wrist motion or certain diseases may cause swelling in the tunnel. This swelling can pinch the main nerve in the wrist (median nerve). What are the causes? This condition may be caused by:  Repeated wrist motions.  Wrist injuries.  Arthritis.  A sac of fluid (cyst) or abnormal growth (tumor) in the carpal tunnel.  Fluid buildup during pregnancy. Sometimes the cause is not known. What increases the risk? The following factors may make you more likely to develop this condition:  Having a job in which you move your wrist in the same way many times. This includes jobs like being a Midwife or a Conservation officer, nature.  Being a woman.  Having other health conditions, such as: ? Diabetes. ? Obesity. ? A thyroid gland that is not active  enough (hypothyroidism). ? Kidney failure. What are the signs or symptoms? Symptoms of this condition include:  A tingling feeling in your fingers.  Tingling or a loss of feeling (numbness) in your hand.  Pain in your entire arm. This pain may get worse when you bend your wrist and elbow for a long time.  Pain in your wrist that goes up your arm to your shoulder.  Pain that goes down into your palm or fingers.  A weak feeling in your hands. You may find it hard to grab and hold items. You may feel worse at night. How is this diagnosed? This condition is diagnosed with a medical history and physical exam. You may also have tests, such as:  Electromyogram (EMG). This test checks the signals that the nerves send to the muscles.  Nerve conduction study. This test checks how well signals pass through your nerves.  Imaging tests, such as X-rays, ultrasound, and MRI. These tests check for what might be the cause of your condition. How is this treated? This condition may be treated with:  Lifestyle changes. You will be asked to stop or change the activity that caused your problem.  Doing exercise and activities that make bones and muscles stronger (physical therapy).  Learning how to use your hand again (occupational therapy).  Medicines for pain and swelling (inflammation). You may have injections in your wrist.  A wrist splint.  Surgery. Follow these instructions at home: If you have a splint:  Wear the splint as told by your doctor.  Remove it only as told by your doctor.  Loosen the splint if your fingers: ? Tingle. ? Lose feeling (become numb). ? Turn cold and blue.  Keep the splint clean.  If the splint is not waterproof: ? Do not let it get wet. ? Cover it with a watertight covering when you take a bath or a shower. Managing pain, stiffness, and swelling   If told, put ice on the painful area: ? If you have a removable splint, remove it as told by your  doctor. ? Put ice in a plastic bag. ? Place a towel between your skin and the bag. ? Leave the ice on for 20 minutes, 2-3 times per day. General instructions  Take over-the-counter and prescription medicines only as told by your doctor.  Rest your wrist from any activity that may cause pain. If needed, talk with your boss at work about changes that can help your wrist heal.  Do any exercises as told by your doctor, physical therapist, or occupational therapist.  Keep all follow-up visits as told by your doctor. This is important. Contact a doctor if:  You have new symptoms.  Medicine does not help your pain.  Your symptoms get worse. Get help right away if:  You have very bad numbness or tingling in your wrist or hand. Summary  Carpal tunnel syndrome is a condition that causes pain in your hand and arm.  It is often caused by repeated wrist motions.  Lifestyle changes and medicines are used to treat this problem. Surgery may help in very bad cases.  Follow your doctor's instructions about wearing a splint, resting your wrist, keeping follow-up visits, and calling for help. This information is not intended to replace advice given to you by your health care provider. Make sure you discuss any questions you have with your health care provider. Document Released: 12/13/2010 Document Revised: 05/02/2017 Document Reviewed: 05/02/2017 Elsevier Interactive Patient Education  2019 [employer].

## 2018-02-24 NOTE — Progress Notes (Signed)
Subjective: CC: GAD/Depression PCP: Raliegh Ip, DO IWL:[id number] V Janet [name] is a 23 y.o. female presenting to clinic today for:  1. Hypothyroidism Patient with history of Hashimoto's thyroiditis status post thyroidectomy.  She is followed by endocrinology with last office visit 2 weeks ago.  She notes that her TSH levels had started normalizing but abruptly became elevated again.  Her dose of Synthroid was increased to 350 mcg.  She notes that she is compliant with medicine and overall symptoms seem to be getting somewhat better but she continues to intermittently gain weight and this is when she knows that her thyroid levels are out of whack again.  She reports the swelling is much better with addition of Lasix, which she takes daily.  2.  Wrist pain Patient reports right-sided wrist pain that seems to be more prominent on the ulnar aspect of the wrist.  She does report repetitive wrist movement and works at [employer].  She also describes intermittent numbness and tingling of the hands and fingers and wonders if she has carpal tunnel.  She has not done anything for the symptoms except for applying icy hot which does seem to help but never last long enough.  ROS: Per HPI  No Known Allergies Past Medical History:  Diagnosis Date  . Allergy   . Anxiety   . GERD (gastroesophageal reflux disease)   . Headache(784.0)   . 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  . Thyroid disease    Hashimotos     Current Outpatient Medications:  .  acetaminophen (MIDOL) 650 MG CR tablet, Take 650 mg by mouth every 8 (eight) hours as needed (for menstrual pain/cramps)., Disp: , Rfl:  .  benzonatate (TESSALON PERLES) 100 MG capsule, Take 1 capsule (100 mg total) by mouth 3 (three) times daily as needed., Disp: 20 capsule, Rfl: 0 .  calcium carbonate (TUMS) 500 MG chewable tablet, Chew 2 tablets (400 mg of elemental calcium total) by mouth 2 (two) times daily., Disp: 90 tablet, Rfl: 1 .   cyclobenzaprine (FLEXERIL) 5 MG tablet, Take 1 tablet (5 mg total) by mouth 3 (three) times daily as needed for muscle spasms., Disp: 30 tablet, Rfl: 0 .  escitalopram (LEXAPRO) 20 MG tablet, Take 1 tablet (20 mg total) by mouth daily., Disp: 90 tablet, Rfl: 1 .  furosemide (LASIX) 20 MG tablet, 10 mg daily., Disp: , Rfl: 0 .  ibuprofen (ADVIL,MOTRIN) 200 MG tablet, Take 600-800 mg by mouth every 8 (eight) hours as needed (for pain/headache)., Disp: , Rfl:  .  lidocaine (LIDODERM) 5 %, Place 1 patch onto the skin daily. Remove & Discard patch within 12 hours or as directed by MD, Disp: 30 patch, Rfl: 0 .  liothyronine (CYTOMEL) 5 MCG tablet, , Disp: , Rfl: 0 .  potassium chloride (MICRO-K) 10 MEQ CR capsule, 10 mEq daily., Disp: , Rfl: 0 .  PROVENTIL HFA 108 (90 Base) MCG/ACT inhaler, TAKE 2 PUFFS BY MOUTH EVERY 6 HOURS AS NEEDED FOR WHEEZE OR SHORTNESS OF BREATH, Disp: 6.7 Inhaler, Rfl: 0 .  SYNTHROID 25 MCG tablet, , Disp: , Rfl: 0 .  SYNTHROID 300 MCG tablet, Take 1 tablet (300 mcg total) by mouth daily before breakfast., Disp: 45 tablet, Rfl: 3 .  traZODone (DESYREL) 50 MG tablet, TAKE 0.5-1 TABLETS (25-50 MG TOTAL) BY MOUTH AT BEDTIME AS NEEDED FOR SLEEP., Disp: 90 tablet, Rfl: 0 Social History   Socioeconomic History  . Marital status: Single    Spouse name: Not  on file  . Number of children: Not on file  . Years of education: Not on file  . Highest education level: Not on file  Occupational History  . Occupation: Conservation officer, [employer]    Comment: Clinical biochemistCustomer service  Social Needs  . Financial resource strain: Not on file  . Food insecurity:    Worry: Not on file    Inability: Not on file  . Transportation needs:    Medical: Not on file    Non-medical: Not on file  Tobacco Use  . Smoking status: Current Some Day Smoker    Packs/day: 0.50    Years: 2.00    Pack years: 1.00    Types: Cigarettes  . Smokeless tobacco: Never Used  Substance and Sexual Activity  . Alcohol use: Not Currently     Comment: rare  . Drug use: No  . Sexual activity: Never    Birth control/protection: None  Lifestyle  . Physical activity:    Days per week: Not on file    Minutes per session: Not on file  . Stress: Not on file  Relationships  . Social connections:    Talks on phone: Not on file    Gets together: Not on file    Attends religious service: Not on file    Active member of club or organization: Not on file    Attends meetings of clubs or organizations: Not on file    Relationship status: Not on file  . Intimate partner violence:    Fear of current or ex partner: Not on file    Emotionally abused: Not on file    Physically abused: Not on file    Forced sexual activity: Not on file  Other Topics Concern  . Not on file  Social History Narrative  . Not on file   Family History  Problem Relation Age of Onset  . Heart attack Maternal Grandmother   . Heart Problems Maternal Grandfather   . Heart attack Maternal Grandfather   . Cancer Paternal Grandmother   . Cancer Paternal Grandfather   . Diabetes Mother   . Other Mother        gastropresis  . Anxiety disorder Mother   . Other Father        syncope-has a pacemaker  . COPD Father     Objective: Office vital signs reviewed. BP 117/75   Pulse 81   Temp 98.2 F (36.8 C) (Oral)   Ht 5\' 6"  (1.676 m)   Wt 295 lb (133.8 kg)   BMI 47.61 kg/m   Physical Examination:  General: Awake, alert, obese, No acute distress HEENT:  Sclera white, MMM Cardio: regular rate and rhythm, S1S2 heard, no murmurs appreciated Pulm: clear to auscultation bilaterally, no wheezes, rhonchi or rales; normal work of breathing on room air MSK:   Right wrist: No gross bony deformity.  No tenderness to palpation to the anatomic snuffbox.  Area of concern appears to be over the ulnar styloid.  She has full active range of motion of the wrist but does have some discomfort in all planes.  Positive reverse Phalen's and positive Tinel's. Psych: mood stable,  speech normal.  Affect appropriate, pleasant Depression screen [facility] 2/9 02/24/2018 11/28/2017 11/14/2017  Decreased Interest 1 1 1   Down, Depressed, Hopeless 0 0 0  PHQ - 2 Score 1 1 1   Altered sleeping 1 0 -  Tired, decreased energy 1 0 -  Change in appetite 0 0 -  Feeling bad or failure about  yourself  0 0 -  Trouble concentrating 0 0 -  Moving slowly or fidgety/restless 0 - -  Suicidal thoughts 0 0 -  PHQ-9 Score 3 1 -  Difficult doing work/chores - Not difficult at all -   GAD 7 : Generalized Anxiety Score 08/19/2017 07/18/2017 06/05/2017  Nervous, Anxious, on Edge 0 0 1  Control/stop worrying 0 2 3  Worry too much - different things 1 2 3   Trouble relaxing 1 2 2   Restless 0 0 1  Easily annoyed or irritable 1 2 3   Afraid - awful might happen 0 0 2  Total GAD 7 Score 3 8 15   Anxiety Difficulty - Somewhat difficult -   Assessment/ Plan: 23 y.o. female   1. Hypothyroidism due to Hashimoto's thyroiditis Continue to follow with endocrinology.  Her weight has come down a few pounds since her last visit in November.  I will defer medication management to their expertise.  2. Wrist pain, acute, right With regards to her wrist pain.  I certainly think this is an overuse injury.  I have recommended rigid bracing at nighttime to treat carpal tunnel.  I have also prescribed her Naprosyn to use twice daily as needed.  Avoid concomitant use with her SSRI as this can increase risk of GI bleed.  Signs and symptoms of GI bleed were reviewed with the patient and she voiced good understanding.  Additionally, she was given home physical therapy exercises for carpal tunnel.  If persistently an issue despite conservative measurements, may need to consider referral to orthopedics for corticosteroid injection.  3. Carpal tunnel syndrome of right wrist As above   No orders of the defined types were placed in this encounter.  Meds ordered this encounter  Medications  . naproxen (NAPROSYN) 500 MG  tablet    Sig: Take 1 tablet (500 mg total) by mouth 2 (two) times daily with a meal. (as needed for wrist pain)    Dispense:  30 tablet    Refill:  0     Cynthea Zachman Hulen Skains, DO [facility] [phone]

## 2018-03-24 DIAGNOSIS — E039 Hypothyroidism, unspecified: Secondary | ICD-10-CM | POA: Diagnosis not present

## 2018-03-24 DIAGNOSIS — E559 Vitamin D deficiency, unspecified: Secondary | ICD-10-CM | POA: Diagnosis not present

## 2018-04-15 DIAGNOSIS — R609 Edema, unspecified: Secondary | ICD-10-CM | POA: Diagnosis not present

## 2018-04-15 DIAGNOSIS — E559 Vitamin D deficiency, unspecified: Secondary | ICD-10-CM | POA: Diagnosis not present

## 2018-04-15 DIAGNOSIS — E039 Hypothyroidism, unspecified: Secondary | ICD-10-CM | POA: Diagnosis not present

## 2018-04-15 DIAGNOSIS — E890 Postprocedural hypothyroidism: Secondary | ICD-10-CM | POA: Diagnosis not present

## 2018-08-12 DIAGNOSIS — E559 Vitamin D deficiency, unspecified: Secondary | ICD-10-CM | POA: Diagnosis not present

## 2018-08-12 DIAGNOSIS — K909 Intestinal malabsorption, unspecified: Secondary | ICD-10-CM | POA: Diagnosis not present

## 2018-08-12 DIAGNOSIS — E039 Hypothyroidism, unspecified: Secondary | ICD-10-CM | POA: Diagnosis not present

## 2018-08-12 DIAGNOSIS — K900 Celiac disease: Secondary | ICD-10-CM | POA: Diagnosis not present

## 2018-08-19 DIAGNOSIS — K909 Intestinal malabsorption, unspecified: Secondary | ICD-10-CM | POA: Diagnosis not present

## 2018-08-19 DIAGNOSIS — E890 Postprocedural hypothyroidism: Secondary | ICD-10-CM | POA: Diagnosis not present

## 2018-08-19 DIAGNOSIS — K900 Celiac disease: Secondary | ICD-10-CM | POA: Diagnosis not present

## 2018-08-19 DIAGNOSIS — E039 Hypothyroidism, unspecified: Secondary | ICD-10-CM | POA: Diagnosis not present

## 2018-09-02 DIAGNOSIS — R112 Nausea with vomiting, unspecified: Secondary | ICD-10-CM | POA: Diagnosis not present

## 2018-09-02 DIAGNOSIS — R197 Diarrhea, unspecified: Secondary | ICD-10-CM | POA: Diagnosis not present

## 2018-09-02 DIAGNOSIS — R1013 Epigastric pain: Secondary | ICD-10-CM | POA: Diagnosis not present

## 2018-09-02 DIAGNOSIS — R1310 Dysphagia, unspecified: Secondary | ICD-10-CM | POA: Diagnosis not present

## 2018-09-08 DIAGNOSIS — K210 Gastro-esophageal reflux disease with esophagitis: Secondary | ICD-10-CM | POA: Diagnosis not present

## 2018-09-08 DIAGNOSIS — R112 Nausea with vomiting, unspecified: Secondary | ICD-10-CM | POA: Diagnosis not present

## 2018-09-08 DIAGNOSIS — R1310 Dysphagia, unspecified: Secondary | ICD-10-CM | POA: Diagnosis not present

## 2018-09-08 DIAGNOSIS — R1013 Epigastric pain: Secondary | ICD-10-CM | POA: Diagnosis not present

## 2018-09-09 DIAGNOSIS — K200 Eosinophilic esophagitis: Secondary | ICD-10-CM | POA: Diagnosis not present

## 2018-10-19 DIAGNOSIS — E039 Hypothyroidism, unspecified: Secondary | ICD-10-CM | POA: Diagnosis not present

## 2018-10-19 DIAGNOSIS — K909 Intestinal malabsorption, unspecified: Secondary | ICD-10-CM | POA: Diagnosis not present

## 2018-10-19 DIAGNOSIS — R1310 Dysphagia, unspecified: Secondary | ICD-10-CM | POA: Diagnosis not present

## 2018-10-19 DIAGNOSIS — E559 Vitamin D deficiency, unspecified: Secondary | ICD-10-CM | POA: Diagnosis not present

## 2018-10-19 DIAGNOSIS — K900 Celiac disease: Secondary | ICD-10-CM | POA: Diagnosis not present

## 2018-10-19 DIAGNOSIS — E890 Postprocedural hypothyroidism: Secondary | ICD-10-CM | POA: Diagnosis not present

## 2018-10-19 DIAGNOSIS — R112 Nausea with vomiting, unspecified: Secondary | ICD-10-CM | POA: Diagnosis not present

## 2018-10-19 DIAGNOSIS — K2100 Gastro-esophageal reflux disease with esophagitis, without bleeding: Secondary | ICD-10-CM | POA: Diagnosis not present

## 2018-10-27 ENCOUNTER — Telehealth: Payer: Self-pay | Admitting: Family Medicine

## 2018-10-27 ENCOUNTER — Other Ambulatory Visit: Payer: Self-pay

## 2018-10-28 ENCOUNTER — Ambulatory Visit: Payer: BC Managed Care – PPO | Admitting: Family Medicine

## 2018-10-28 ENCOUNTER — Encounter: Payer: Self-pay | Admitting: Family Medicine

## 2018-10-28 VITALS — BP 114/73 | HR 63 | Temp 96.8°F | Resp 22 | Ht 66.0 in | Wt 313.0 lb

## 2018-10-28 DIAGNOSIS — R7989 Other specified abnormal findings of blood chemistry: Secondary | ICD-10-CM

## 2018-10-28 DIAGNOSIS — R7401 Elevation of levels of liver transaminase levels: Secondary | ICD-10-CM

## 2018-10-28 DIAGNOSIS — M7989 Other specified soft tissue disorders: Secondary | ICD-10-CM

## 2018-10-28 MED ORDER — FUROSEMIDE 20 MG PO TABS: 10.0000 mg | ORAL_TABLET | Freq: Every day | ORAL | 1 refills | Status: DC

## 2018-10-28 NOTE — Patient Instructions (Signed)
Edema  Edema is when you have too much fluid in your body or under your skin. Edema may make your legs, feet, and ankles swell up. Swelling is also common in looser tissues, like around your eyes. This is a common condition. It gets more common as you get older. There are many possible causes of edema. Eating too much salt (sodium) and being on your feet or sitting for a long time can cause edema in your legs, feet, and ankles. Hot weather may make edema worse. Edema is usually painless. Your skin may look swollen or shiny. Follow these instructions at home:  Keep the swollen body part raised (elevated) above the level of your heart when you are sitting or lying down.  Do not sit still or stand for a long time.  Do not wear tight clothes. Do not wear garters on your upper legs.  Exercise your legs. This can help the swelling go down.  Wear elastic bandages or support stockings as told by your doctor.  Eat a low-salt (low-sodium) diet to reduce fluid as told by your doctor.  Depending on the cause of your swelling, you may need to limit how much fluid you drink (fluid restriction).  Take over-the-counter and prescription medicines only as told by your doctor. Contact a doctor if:  Treatment is not working.  You have heart, liver, or kidney disease and have symptoms of edema.  You have sudden and unexplained weight gain. Get help right away if:  You have shortness of breath or chest pain.  You cannot breathe when you lie down.  You have pain, redness, or warmth in the swollen areas.  You have heart, liver, or kidney disease and get edema all of a sudden.  You have a fever and your symptoms get worse all of a sudden. Summary  Edema is when you have too much fluid in your body or under your skin.  Edema may make your legs, feet, and ankles swell up. Swelling is also common in looser tissues, like around your eyes.  Raise (elevate) the swollen body part above the level of your  heart when you are sitting or lying down.  Follow your doctor's instructions about diet and how much fluid you can drink (fluid restriction). This information is not intended to replace advice given to you by your health care provider. Make sure you discuss any questions you have with your health care provider. Document Released: 06/12/2007 Document Revised: 12/27/2016 Document Reviewed: 01/12/2016 Elsevier Patient Education  2020 Elsevier Inc.  

## 2018-10-28 NOTE — Progress Notes (Signed)
Subjective:  Patient ID: Janet [name], female    DOB: [birth year], 23 y.o.   MRN: [id number]  Patient Care Team: [name], DO as PCP - General (Family Medicine)   Chief Complaint:  Edema (bilat hands and feet and abd )   HPI: Janet [name] is a 23 y.o. female presenting on 10/28/2018 for Edema (bilat hands and feet and abd )   Pt presents today with ongoing hand and lower leg swelling. This has been problematic for several months. The furosemide was beneficial, has not taken in several weeks. No chest pain, shortness of breath, palpitations, dizziness, confusion, or syncope. Pt is followed by endocrinology for her hypothyroidism, has upcoming appointment.    Relevant past medical, surgical, family, and social history reviewed and updated as indicated.  Allergies and medications reviewed and updated. Date reviewed: Chart in Epic.   Past Medical History:  Diagnosis Date  . Allergy   . Anxiety   . GERD (gastroesophageal reflux disease)   . Headache(784.0)   . 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  . Thyroid disease    Hashimotos     Past Surgical History:  Procedure Laterality Date  . THYROIDECTOMY N/A 07/01/2017   Procedure: TOTAL THYROIDECTOMY;  Surgeon: [name], MD;  Location: [facility];  Service: General;  Laterality: N/A;  . TONSILLECTOMY AND ADENOIDECTOMY Bilateral 01/2011  . TOTAL THYROIDECTOMY     Dr. [name] 07-01-17    Social History   Socioeconomic History  . Marital status: Single    Spouse name: Not on file  . Number of children: Not on file  . Years of education: Not on file  . Highest education level: Not on file  Occupational History  . Occupation: Scientist, water quality    Comment: Therapist, art  Social Needs  . Financial resource strain: Not on file  . Food insecurity    Worry: Not on file    Inability: Not on file  . Transportation needs    Medical: Not on file    Non-medical: Not on file  Tobacco Use  . Smoking status: Current  Some Day Smoker    Packs/day: 0.50    Years: 2.00    Pack years: 1.00    Types: Cigarettes  . Smokeless tobacco: Never Used  Substance and Sexual Activity  . Alcohol use: Not Currently    Comment: rare  . Drug use: No  . Sexual activity: Never    Birth control/protection: None  Lifestyle  . Physical activity    Days per week: Not on file    Minutes per session: Not on file  . Stress: Not on file  Relationships  . Social Herbalist on phone: Not on file    Gets together: Not on file    Attends religious service: Not on file    Active member of club or organization: Not on file    Attends meetings of clubs or organizations: Not on file    Relationship status: Not on file  . Intimate partner violence    Fear of current or ex partner: Not on file    Emotionally abused: Not on file    Physically abused: Not on file    Forced sexual activity: Not on file  Other Topics Concern  . Not on file  Social History Narrative  . Not on file    Outpatient Encounter Medications as of 10/28/2018  Medication Sig  . acetaminophen (MIDOL) 650 MG CR  tablet Take 650 mg by mouth every 8 (eight) hours as needed (for menstrual pain/cramps).  . ergocalciferol (VITAMIN D2) 1.25 MG (50000 UT) capsule Take 1 capsule by mouth once a week.  . escitalopram (LEXAPRO) 20 MG tablet Take 1 tablet (20 mg total) by mouth daily.  Marland Kitchen ibuprofen (ADVIL,MOTRIN) 200 MG tablet Take 600-800 mg by mouth every 8 (eight) hours as needed (for pain/headache).  . naproxen (NAPROSYN) 500 MG tablet Take 1 tablet (500 mg total) by mouth 2 (two) times daily with a meal. (as needed for wrist pain)  . TIROSINT-SOL 150 MCG/ML SOLN Take 2 mLs by mouth daily.  . furosemide (LASIX) 20 MG tablet Take 0.5 tablets (10 mg total) by mouth daily.  Marland Kitchen lidocaine (LIDODERM) 5 % Place 1 patch onto the skin daily. Remove & Discard patch within 12 hours or as directed by MD (Patient not taking: Reported on 10/28/2018)  . [DISCONTINUED]  benzonatate (TESSALON PERLES) 100 MG capsule Take 1 capsule (100 mg total) by mouth 3 (three) times daily as needed.  . [DISCONTINUED] calcium carbonate (TUMS) 500 MG chewable tablet Chew 2 tablets (400 mg of elemental calcium total) by mouth 2 (two) times daily.  . [DISCONTINUED] cyclobenzaprine (FLEXERIL) 5 MG tablet Take 1 tablet (5 mg total) by mouth 3 (three) times daily as needed for muscle spasms.  . [DISCONTINUED] furosemide (LASIX) 20 MG tablet 10 mg daily.  . [DISCONTINUED] liothyronine (CYTOMEL) 5 MCG tablet   . [DISCONTINUED] potassium chloride (MICRO-K) 10 MEQ CR capsule 10 mEq daily.  . [DISCONTINUED] PROVENTIL HFA 108 (90 Base) MCG/ACT inhaler TAKE 2 PUFFS BY MOUTH EVERY 6 HOURS AS NEEDED FOR WHEEZE OR SHORTNESS OF BREATH  . [DISCONTINUED] SYNTHROID 25 MCG tablet   . [DISCONTINUED] SYNTHROID 300 MCG tablet Take 1 tablet (300 mcg total) by mouth daily before breakfast.  . [DISCONTINUED] traZODone (DESYREL) 50 MG tablet TAKE 0.5-1 TABLETS (25-50 MG TOTAL) BY MOUTH AT BEDTIME AS NEEDED FOR SLEEP.   No facility-administered encounter medications on file as of 10/28/2018.     No Known Allergies  Review of Systems  Constitutional: Negative for activity change, appetite change, chills, diaphoresis, fatigue, fever and unexpected weight change.  HENT: Negative.   Eyes: Negative.  Negative for photophobia and visual disturbance.  Respiratory: Negative for cough, chest tightness and shortness of breath.   Cardiovascular: Positive for leg swelling. Negative for chest pain and palpitations.       Hand swelling  Gastrointestinal: Negative for blood in stool, constipation, diarrhea, nausea and vomiting.  Endocrine: Negative.  Negative for polydipsia, polyphagia and polyuria.  Genitourinary: Negative for decreased urine volume, difficulty urinating, dysuria, frequency and urgency.  Musculoskeletal: Negative for arthralgias and myalgias.  Skin: Negative.  Negative for color change and  pallor.  Allergic/Immunologic: Negative.   Neurological: Negative for dizziness, tremors, seizures, syncope, facial asymmetry, speech difficulty, weakness, light-headedness, numbness and headaches.  Hematological: Negative.   Psychiatric/Behavioral: Negative for confusion, hallucinations, sleep disturbance and suicidal ideas.  All other systems reviewed and are negative.       Objective:  BP 114/73   Pulse 63   Temp (!) 96.8 F (36 C)   Resp (!) 22   Ht 5' 6"  (1.676 m)   Wt (!) 313 lb (142 kg)   LMP 09/24/2018   SpO2 98%   BMI 50.52 kg/m    Wt Readings from Last 3 Encounters:  10/28/18 (!) 313 lb (142 kg)  02/24/18 295 lb (133.8 kg)  11/28/17 295 lb (133.8 kg)  Physical Exam Vitals signs and nursing note reviewed.  Constitutional:      General: She is not in acute distress.    Appearance: Normal appearance. She is well-developed and well-groomed. She is morbidly obese. She is not ill-appearing, toxic-appearing or diaphoretic.  HENT:     Head: Normocephalic and atraumatic.     Jaw: There is normal jaw occlusion.     Right Ear: Hearing normal.     Left Ear: Hearing normal.     Nose: Nose normal.     Mouth/Throat:     Lips: Pink.     Mouth: Mucous membranes are moist.     Pharynx: Oropharynx is clear. Uvula midline.  Eyes:     General: Lids are normal.     Extraocular Movements: Extraocular movements intact.     Conjunctiva/sclera: Conjunctivae normal.     Pupils: Pupils are equal, round, and reactive to light.  Neck:     Musculoskeletal: Normal range of motion and neck supple.     Thyroid: No thyroid mass, thyromegaly or thyroid tenderness.     Vascular: No carotid bruit or JVD.     Trachea: Trachea and phonation normal.  Cardiovascular:     Rate and Rhythm: Normal rate and regular rhythm.     Chest Wall: PMI is not displaced.     Pulses: Normal pulses.     Heart sounds: Normal heart sounds. No murmur. No friction rub. No gallop. No S3 sounds.      Comments:  Bilateral hand swelling, nonpitting Pulmonary:     Effort: Pulmonary effort is normal. No respiratory distress.     Breath sounds: Normal breath sounds. No decreased breath sounds, wheezing, rhonchi or rales.  Abdominal:     General: Abdomen is protuberant. Bowel sounds are normal. There is no distension or abdominal bruit.     Palpations: Abdomen is soft. There is no hepatomegaly or splenomegaly.     Tenderness: There is no abdominal tenderness. There is no right CVA tenderness or left CVA tenderness.     Hernia: No hernia is present.  Musculoskeletal: Normal range of motion.     Right lower leg: 2+ Edema present.     Left lower leg: 2+ Edema present.  Lymphadenopathy:     Cervical: No cervical adenopathy.  Skin:    General: Skin is warm and dry.     Capillary Refill: Capillary refill takes less than 2 seconds.     Coloration: Skin is not cyanotic, jaundiced or pale.     Findings: No rash.  Neurological:     General: No focal deficit present.     Mental Status: She is alert and oriented to person, place, and time.     Cranial Nerves: Cranial nerves are intact.     Sensory: Sensation is intact.     Motor: Motor function is intact.     Coordination: Coordination is intact.     Gait: Gait is intact.     Deep Tendon Reflexes: Reflexes are normal and symmetric.  Psychiatric:        Attention and Perception: Attention and perception normal.        Mood and Affect: Mood and affect normal.        Speech: Speech normal.        Behavior: Behavior normal. Behavior is cooperative.        Thought Content: Thought content normal.        Cognition and Memory: Cognition and memory normal.  Judgment: Judgment normal.     Results for orders placed or performed in visit on 11/14/17  CMP14+EGFR  Result Value Ref Range   Glucose 83 65 - 99 mg/dL   BUN 14 6 - 20 mg/dL   Creatinine, Ser 0.76 0.57 - 1.00 mg/dL   GFR calc non Af Amer 111 >59 mL/min/1.73   GFR calc Af Amer 128 >59  mL/min/1.73   BUN/Creatinine Ratio 18 9 - 23   Sodium 139 134 - 144 mmol/L   Potassium 4.3 3.5 - 5.2 mmol/L   Chloride 104 96 - 106 mmol/L   CO2 21 20 - 29 mmol/L   Calcium 8.6 (L) 8.7 - 10.2 mg/dL   Total Protein 6.6 6.0 - 8.5 g/dL   Albumin 3.9 3.5 - 5.5 g/dL   Globulin, Total 2.7 1.5 - 4.5 g/dL   Albumin/Globulin Ratio 1.4 1.2 - 2.2   Bilirubin Total 0.3 0.0 - 1.2 mg/dL   Alkaline Phosphatase 82 39 - 117 IU/L   AST 9 0 - 40 IU/L   ALT 12 0 - 32 IU/L       Pertinent labs & imaging results that were available during my care of the patient were reviewed by me and considered in my medical decision making.  Assessment & Plan:  Kerrianne was seen today for edema.  Diagnoses and all orders for this visit:  Swelling of both hands Swelling of both lower extremities Swelling likely secondary to hypothyroidism. Symptomatic care discussed. Compression socks, limited sodium in diet. Will recheck renal function today and refill furosemide. Pt aware to report any new, worsening, or persistent symptoms.  -     CMP14+EGFR -     furosemide (LASIX) 20 MG tablet; Take 0.5 tablets (10 mg total) by mouth daily.     Continue all other maintenance medications.  Follow up plan: Return in about 4 weeks (around 11/25/2018), or if symptoms worsen or fail to improve, for edema.  Continue healthy lifestyle choices, including diet (rich in fruits, vegetables, and lean proteins, and low in salt and simple carbohydrates) and exercise (at least 30 minutes of moderate physical activity daily).  Educational handout given for edema  The above assessment and management plan was discussed with the patient. The patient verbalized understanding of and has agreed to the management plan. Patient is aware to call the clinic if they develop any new symptoms or if symptoms persist or worsen. Patient is aware when to return to the clinic for a follow-up visit. Patient educated on when it is appropriate to go to the  emergency department.   Monia Pouch, FNP-C [facility] [phone]

## 2018-10-29 LAB — CMP14+EGFR
ALT: 54 IU/L — ABNORMAL HIGH (ref 0–32)
AST: 109 IU/L — ABNORMAL HIGH (ref 0–40)
Albumin/Globulin Ratio: 1.6 (ref 1.2–2.2)
Albumin: 4.5 g/dL (ref 3.9–5.0)
Alkaline Phosphatase: 75 IU/L (ref 39–117)
BUN/Creatinine Ratio: 13 (ref 9–23)
BUN: 15 mg/dL (ref 6–20)
Bilirubin Total: 0.3 mg/dL (ref 0.0–1.2)
CO2: 21 mmol/L (ref 20–29)
Calcium: 9.3 mg/dL (ref 8.7–10.2)
Chloride: 100 mmol/L (ref 96–106)
Creatinine, Ser: 1.17 mg/dL — ABNORMAL HIGH (ref 0.57–1.00)
GFR calc Af Amer: 75 mL/min/{1.73_m2} (ref >59)
GFR calc non Af Amer: 65 mL/min/{1.73_m2} (ref >59)
Globulin, Total: 2.8 g/dL (ref 1.5–4.5)
Glucose: 79 mg/dL (ref 65–99)
Potassium: 3.9 mmol/L (ref 3.5–5.2)
Sodium: 136 mmol/L (ref 134–144)
Total Protein: 7.3 g/dL (ref 6.0–8.5)

## 2018-10-29 NOTE — Addendum Note (Signed)
Addended by: Baruch Gouty on: 10/29/2018 09:16 AM   Modules accepted: Orders

## 2018-11-05 ENCOUNTER — Other Ambulatory Visit: Payer: BC Managed Care – PPO

## 2018-11-05 DIAGNOSIS — R7989 Other specified abnormal findings of blood chemistry: Secondary | ICD-10-CM | POA: Diagnosis not present

## 2018-11-05 DIAGNOSIS — M7989 Other specified soft tissue disorders: Secondary | ICD-10-CM | POA: Diagnosis not present

## 2018-11-05 DIAGNOSIS — R7401 Elevation of levels of liver transaminase levels: Secondary | ICD-10-CM

## 2018-11-06 ENCOUNTER — Telehealth: Payer: Self-pay | Admitting: Family Medicine

## 2018-11-06 LAB — CMP14+EGFR
ALT: 28 IU/L (ref 0–32)
AST: 43 IU/L — ABNORMAL HIGH (ref 0–40)
Albumin/Globulin Ratio: 1.7 (ref 1.2–2.2)
Albumin: 4.5 g/dL (ref 3.9–5.0)
Alkaline Phosphatase: 79 IU/L (ref 39–117)
BUN/Creatinine Ratio: 13 (ref 9–23)
BUN: 14 mg/dL (ref 6–20)
Bilirubin Total: 0.3 mg/dL (ref 0.0–1.2)
CO2: 23 mmol/L (ref 20–29)
Calcium: 8.5 mg/dL — ABNORMAL LOW (ref 8.7–10.2)
Chloride: 100 mmol/L (ref 96–106)
Creatinine, Ser: 1.08 mg/dL — ABNORMAL HIGH (ref 0.57–1.00)
GFR calc Af Amer: 83 mL/min/{1.73_m2} (ref >59)
GFR calc non Af Amer: 72 mL/min/{1.73_m2} (ref >59)
Globulin, Total: 2.7 g/dL (ref 1.5–4.5)
Glucose: 102 mg/dL — ABNORMAL HIGH (ref 65–99)
Potassium: 3.9 mmol/L (ref 3.5–5.2)
Sodium: 138 mmol/L (ref 134–144)
Total Protein: 7.2 g/dL (ref 6.0–8.5)

## 2018-11-06 NOTE — Telephone Encounter (Signed)
She is seeing endocrinology. She can follow up with them for thyroid. Her AST is very slightly elevated. Her Creatinine was very slightly elevated. This can be rechecked in 1 week, if it remains elevated, we can place a referral.

## 2018-11-06 NOTE — Telephone Encounter (Signed)
[email or web address]

## 2018-11-06 NOTE — Telephone Encounter (Signed)
Patient saw you for this

## 2018-11-24 ENCOUNTER — Other Ambulatory Visit: Payer: Self-pay

## 2018-11-25 ENCOUNTER — Ambulatory Visit (INDEPENDENT_AMBULATORY_CARE_PROVIDER_SITE_OTHER): Payer: BC Managed Care – PPO | Admitting: Family Medicine

## 2018-11-25 ENCOUNTER — Encounter: Payer: Self-pay | Admitting: Family Medicine

## 2018-11-25 VITALS — BP 106/72 | HR 59 | Temp 97.0°F | Resp 20 | Ht 66.0 in | Wt 321.0 lb

## 2018-11-25 DIAGNOSIS — E038 Other specified hypothyroidism: Secondary | ICD-10-CM | POA: Diagnosis not present

## 2018-11-25 DIAGNOSIS — M7989 Other specified soft tissue disorders: Secondary | ICD-10-CM | POA: Diagnosis not present

## 2018-11-25 DIAGNOSIS — E063 Autoimmune thyroiditis: Secondary | ICD-10-CM | POA: Diagnosis not present

## 2018-11-25 NOTE — Progress Notes (Signed)
Subjective:  Patient ID: Janet [name], female    DOB: August 29, 1995, 23 y.o.   MRN: [id number]  Patient Care Team: [name], DO as PCP - General (Family Medicine)   Chief Complaint:  Edema (4 week follow up )   HPI: Janet [name] is a 23 y.o. female presenting on 11/25/2018 for Edema (4 week follow up )   1. Swelling of both lower extremities 2. Swelling of both hands Continued swelling or all 4 extremities. Denies SHOB, chest pain, palpitations, weakness, confusion, changes in urine output, or syncope. Has been taking diuretic with some relief of symptoms.   3. Hypothyroidism due to Hashimoto's thyroiditis Followed by endocrinology. Recent change in repletion therapy. Has upcoming appointment for TSH recheck in 2 weeks.      Relevant past medical, surgical, family, and social history reviewed and updated as indicated.  Allergies and medications reviewed and updated. Date reviewed: Chart in Epic.   Past Medical History:  Diagnosis Date  . Allergy   . Anxiety   . GERD (gastroesophageal reflux disease)   . Headache(784.0)   . 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  . Thyroid disease    Hashimotos     Past Surgical History:  Procedure Laterality Date  . THYROIDECTOMY N/A 07/01/2017   Procedure: TOTAL THYROIDECTOMY;  Surgeon: [name], MD;  Location: [facility];  Service: General;  Laterality: N/A;  . TONSILLECTOMY AND ADENOIDECTOMY Bilateral 01/2011  . TOTAL THYROIDECTOMY     Dr. [name] 07-01-17    Social History   Socioeconomic History  . Marital status: Single    Spouse name: Not on file  . Number of children: Not on file  . Years of education: Not on file  . Highest education level: Not on file  Occupational History  . Occupation: Scientist, water quality    Comment: Therapist, art  Social Needs  . Financial resource strain: Not on file  . Food insecurity    Worry: Not on file    Inability: Not on file  . Transportation needs    Medical: Not on file     Non-medical: Not on file  Tobacco Use  . Smoking status: Current Some Day Smoker    Packs/day: 0.50    Years: 2.00    Pack years: 1.00    Types: Cigarettes  . Smokeless tobacco: Never Used  Substance and Sexual Activity  . Alcohol use: Not Currently    Comment: rare  . Drug use: No  . Sexual activity: Never    Birth control/protection: None  Lifestyle  . Physical activity    Days per week: Not on file    Minutes per session: Not on file  . Stress: Not on file  Relationships  . Social Herbalist on phone: Not on file    Gets together: Not on file    Attends religious service: Not on file    Active member of club or organization: Not on file    Attends meetings of clubs or organizations: Not on file    Relationship status: Not on file  . Intimate partner violence    Fear of current or ex partner: Not on file    Emotionally abused: Not on file    Physically abused: Not on file    Forced sexual activity: Not on file  Other Topics Concern  . Not on file  Social History Narrative  . Not on file    Outpatient Encounter Medications  as of 11/25/2018  Medication Sig  . acetaminophen (MIDOL) 650 MG CR tablet Take 650 mg by mouth every 8 (eight) hours as needed (for menstrual pain/cramps).  . ergocalciferol (VITAMIN D2) 1.25 MG (50000 UT) capsule Take 1 capsule by mouth once a week.  . escitalopram (LEXAPRO) 20 MG tablet Take 1 tablet (20 mg total) by mouth daily.  . furosemide (LASIX) 20 MG tablet Take 0.5 tablets (10 mg total) by mouth daily.  Marland Kitchen ibuprofen (ADVIL,MOTRIN) 200 MG tablet Take 600-800 mg by mouth every 8 (eight) hours as needed (for pain/headache).  . lidocaine (LIDODERM) 5 % Place 1 patch onto the skin daily. Remove & Discard patch within 12 hours or as directed by MD  . naproxen (NAPROSYN) 500 MG tablet Take 1 tablet (500 mg total) by mouth 2 (two) times daily with a meal. (as needed for wrist pain)  . TIROSINT-SOL 150 MCG/ML SOLN Take 2 mLs by mouth  daily.   No facility-administered encounter medications on file as of 11/25/2018.     No Known Allergies  Review of Systems  Constitutional: Positive for fatigue. Negative for activity change, appetite change, chills, diaphoresis, fever and unexpected weight change.  HENT: Negative.   Eyes: Negative.  Negative for photophobia and visual disturbance.  Respiratory: Negative for cough, chest tightness and shortness of breath.   Cardiovascular: Positive for leg swelling. Negative for chest pain and palpitations.  Gastrointestinal: Negative for blood in stool, constipation, diarrhea, nausea and vomiting.  Endocrine: Negative.   Genitourinary: Negative for decreased urine volume, difficulty urinating, dysuria, frequency and urgency.  Musculoskeletal: Negative for arthralgias and myalgias.  Skin: Negative.   Allergic/Immunologic: Negative.   Neurological: Negative for dizziness, tremors, seizures, syncope, facial asymmetry, speech difficulty, weakness, light-headedness, numbness and headaches.  Hematological: Negative.   Psychiatric/Behavioral: Negative for confusion, hallucinations, sleep disturbance and suicidal ideas.  All other systems reviewed and are negative.       Objective:  BP 106/72   Pulse (!) 59   Temp (!) 97 F (36.1 C)   Resp 20   Ht _0  (1.676 m)   Wt (!) 321 lb (145.6 kg)   LMP 11/06/2018   SpO2 99%   BMI 51.81 kg/m    Wt Readings from Last 3 Encounters:  11/25/18 (!) 321 lb (145.6 kg)  10/28/18 (!) 313 lb (142 kg)  02/24/18 295 lb (133.8 kg)    Physical Exam Vitals signs and nursing note reviewed.  Constitutional:      General: She is not in acute distress.    Appearance: Normal appearance. She is well-developed and well-groomed. She is morbidly obese. She is not ill-appearing, toxic-appearing or diaphoretic.  HENT:     Head: Normocephalic and atraumatic.     Jaw: There is normal jaw occlusion.     Right Ear: Hearing normal.     Left Ear: Hearing  normal.     Nose: Nose normal.     Mouth/Throat:     Lips: Pink.     Mouth: Mucous membranes are moist.     Pharynx: Oropharynx is clear. Uvula midline.  Eyes:     General: Lids are normal.     Extraocular Movements: Extraocular movements intact.     Conjunctiva/sclera: Conjunctivae normal.     Pupils: Pupils are equal, round, and reactive to light.  Neck:     Musculoskeletal: Normal range of motion and neck supple.     Thyroid: No thyroid mass, thyromegaly or thyroid tenderness.     Vascular:  No carotid bruit or JVD.     Trachea: Trachea and phonation normal.  Cardiovascular:     Rate and Rhythm: Normal rate and regular rhythm.     Chest Wall: PMI is not displaced.     Pulses: Normal pulses.     Heart sounds: Normal heart sounds. No murmur. No friction rub. No gallop.   Pulmonary:     Effort: Pulmonary effort is normal. No respiratory distress.     Breath sounds: Normal breath sounds. No wheezing.  Abdominal:     General: Bowel sounds are normal. There is no distension or abdominal bruit.     Palpations: Abdomen is soft. There is no hepatomegaly or splenomegaly.     Tenderness: There is no abdominal tenderness. There is no right CVA tenderness or left CVA tenderness.     Hernia: No hernia is present.  Musculoskeletal: Normal range of motion.     Right lower leg: 2+ Edema present.     Left lower leg: 2+ Edema present.  Lymphadenopathy:     Cervical: No cervical adenopathy.  Skin:    General: Skin is warm and dry.     Capillary Refill: Capillary refill takes less than 2 seconds.     Coloration: Skin is not cyanotic, jaundiced or pale.     Findings: No rash.  Neurological:     General: No focal deficit present.     Mental Status: She is alert and oriented to person, place, and time.     Cranial Nerves: Cranial nerves are intact. No cranial nerve deficit.     Sensory: Sensation is intact. No sensory deficit.     Motor: Motor function is intact. No weakness.     Coordination:  Coordination is intact. Coordination normal.     Gait: Gait is intact. Gait normal.     Deep Tendon Reflexes: Reflexes are normal and symmetric. Reflexes normal.  Psychiatric:        Attention and Perception: Attention and perception normal.        Mood and Affect: Mood and affect normal.        Speech: Speech normal.        Behavior: Behavior normal. Behavior is cooperative.        Thought Content: Thought content normal.        Cognition and Memory: Cognition and memory normal.        Judgment: Judgment normal.     Results for orders placed or performed in visit on 11/05/18  CMP14+EGFR  Result Value Ref Range   Glucose 102 (H) 65 - 99 mg/dL   BUN 14 6 - 20 mg/dL   Creatinine, Ser 1.08 (H) 0.57 - 1.00 mg/dL   GFR calc non Af Amer 72 >59 mL/min/1.73   GFR calc Af Amer 83 >59 mL/min/1.73   BUN/Creatinine Ratio 13 9 - 23   Sodium 138 134 - 144 mmol/L   Potassium 3.9 3.5 - 5.2 mmol/L   Chloride 100 96 - 106 mmol/L   CO2 23 20 - 29 mmol/L   Calcium 8.5 (L) 8.7 - 10.2 mg/dL   Total Protein 7.2 6.0 - 8.5 g/dL   Albumin 4.5 3.9 - 5.0 g/dL   Globulin, Total 2.7 1.5 - 4.5 g/dL   Albumin/Globulin Ratio 1.7 1.2 - 2.2   Bilirubin Total 0.3 0.0 - 1.2 mg/dL   Alkaline Phosphatase 79 39 - 117 IU/L   AST 43 (H) 0 - 40 IU/L   ALT 28 0 - 32 IU/L  Pertinent labs & imaging results that were available during my care of the patient were reviewed by me and considered in my medical decision making.  Assessment & Plan:  Tian was seen today for edema.  Diagnoses and all orders for this visit:  Swelling of both lower extremities Swelling of both hands Nonpitting edema of all 4 extremities. No SHOB, chest pain, weakness, or palpitations. Likely due to uncontrolled hypothyroidism. Will recheck CMP today. Continue diuretic. Keep follow up with endocrinology. Symptomatic care discussed in detail.  -     CMP14+EGFR  Hypothyroidism due to Hashimoto's thyroiditis Has upcoming appointment with  endocrinology. Continue current therapy.     Continue all other maintenance medications.  Follow up plan: Return if symptoms worsen or fail to improve.  Continue healthy lifestyle choices, including diet (rich in fruits, vegetables, and lean proteins, and low in salt and simple carbohydrates) and exercise (at least 30 minutes of moderate physical activity daily).   The above assessment and management plan was discussed with the patient. The patient verbalized understanding of and has agreed to the management plan. Patient is aware to call the clinic if they develop any new symptoms or if symptoms persist or worsen. Patient is aware when to return to the clinic for a follow-up visit. Patient educated on when it is appropriate to go to the emergency department.   Monia Pouch, FNP-C [facility] [phone]

## 2018-11-26 LAB — CMP14+EGFR
ALT: 34 IU/L — ABNORMAL HIGH (ref 0–32)
AST: 81 IU/L — ABNORMAL HIGH (ref 0–40)
Albumin/Globulin Ratio: 1.7 (ref 1.2–2.2)
Albumin: 4.5 g/dL (ref 3.9–5.0)
Alkaline Phosphatase: 73 IU/L (ref 39–117)
BUN/Creatinine Ratio: 11 (ref 9–23)
BUN: 16 mg/dL (ref 6–20)
Bilirubin Total: 0.3 mg/dL (ref 0.0–1.2)
CO2: 21 mmol/L (ref 20–29)
Calcium: 9.2 mg/dL (ref 8.7–10.2)
Chloride: 101 mmol/L (ref 96–106)
Creatinine, Ser: 1.48 mg/dL — ABNORMAL HIGH (ref 0.57–1.00)
GFR calc Af Amer: 57 mL/min/{1.73_m2} — ABNORMAL LOW (ref >59)
GFR calc non Af Amer: 49 mL/min/{1.73_m2} — ABNORMAL LOW (ref >59)
Globulin, Total: 2.6 g/dL (ref 1.5–4.5)
Glucose: 72 mg/dL (ref 65–99)
Potassium: 4.4 mmol/L (ref 3.5–5.2)
Sodium: 140 mmol/L (ref 134–144)
Total Protein: 7.1 g/dL (ref 6.0–8.5)

## 2018-12-01 ENCOUNTER — Other Ambulatory Visit: Payer: Self-pay

## 2018-12-01 ENCOUNTER — Other Ambulatory Visit: Payer: Self-pay | Admitting: Family Medicine

## 2018-12-01 ENCOUNTER — Other Ambulatory Visit: Payer: BC Managed Care – PPO

## 2018-12-01 DIAGNOSIS — R7401 Elevation of levels of liver transaminase levels: Secondary | ICD-10-CM

## 2018-12-01 DIAGNOSIS — R7989 Other specified abnormal findings of blood chemistry: Secondary | ICD-10-CM

## 2018-12-02 ENCOUNTER — Ambulatory Visit: Payer: BC Managed Care – PPO | Admitting: Family Medicine

## 2018-12-02 ENCOUNTER — Telehealth: Payer: Self-pay | Admitting: Family Medicine

## 2018-12-02 LAB — CMP14+EGFR
ALT: 25 IU/L (ref 0–32)
AST: 42 IU/L — ABNORMAL HIGH (ref 0–40)
Albumin/Globulin Ratio: 1.5 (ref 1.2–2.2)
Albumin: 4.4 g/dL (ref 3.9–5.0)
Alkaline Phosphatase: 72 IU/L (ref 39–117)
BUN/Creatinine Ratio: 9 (ref 9–23)
BUN: 11 mg/dL (ref 6–20)
Bilirubin Total: 0.4 mg/dL (ref 0.0–1.2)
CO2: 19 mmol/L — ABNORMAL LOW (ref 20–29)
Calcium: 8.6 mg/dL — ABNORMAL LOW (ref 8.7–10.2)
Chloride: 103 mmol/L (ref 96–106)
Creatinine, Ser: 1.25 mg/dL — ABNORMAL HIGH (ref 0.57–1.00)
GFR calc Af Amer: 70 mL/min/{1.73_m2} (ref >59)
GFR calc non Af Amer: 60 mL/min/{1.73_m2} (ref >59)
Globulin, Total: 2.9 g/dL (ref 1.5–4.5)
Glucose: 96 mg/dL (ref 65–99)
Potassium: 3.9 mmol/L (ref 3.5–5.2)
Sodium: 140 mmol/L (ref 134–144)
Total Protein: 7.3 g/dL (ref 6.0–8.5)

## 2018-12-02 NOTE — Telephone Encounter (Signed)
Lab reviewed and d pcp appt made

## 2018-12-08 ENCOUNTER — Encounter: Payer: Self-pay | Admitting: Family Medicine

## 2018-12-08 ENCOUNTER — Other Ambulatory Visit: Payer: Self-pay

## 2018-12-08 ENCOUNTER — Ambulatory Visit: Payer: BC Managed Care – PPO | Admitting: Family Medicine

## 2018-12-08 VITALS — BP 100/62 | HR 80 | Temp 96.8°F | Ht 66.0 in | Wt 324.0 lb

## 2018-12-08 DIAGNOSIS — R601 Generalized edema: Secondary | ICD-10-CM

## 2018-12-08 DIAGNOSIS — N289 Disorder of kidney and ureter, unspecified: Secondary | ICD-10-CM | POA: Diagnosis not present

## 2018-12-08 DIAGNOSIS — R7989 Other specified abnormal findings of blood chemistry: Secondary | ICD-10-CM | POA: Diagnosis not present

## 2018-12-08 DIAGNOSIS — M545 Low back pain, unspecified: Secondary | ICD-10-CM

## 2018-12-08 DIAGNOSIS — M6740 Ganglion, unspecified site: Secondary | ICD-10-CM

## 2018-12-08 NOTE — Progress Notes (Signed)
Subjective: CC: Follow-up edema PCP: Janet Norlander, Janet [name] Janet [name] is a 23 y.o. female presenting to clinic today for:  1.  Edema Patient with ongoing generalized edema.  She was seen a few weeks ago by one of my partners, who started her on Lasix.  She notes initially she did feel like she had decent urine output but that as of late she is not urinating as much.  She does not feel like the pill is working.  She has not been checking her blood pressures at home.  She does report some sensation of lethargy and feels that she is moving more slowly than normal.  Of note she is treated for Hashimoto's thyroiditis by an endocrinologist and he recently had her thyroid medication increased.  She reports occasional use of ibuprofen.  She does not consume much salt.  She reports rarely adding anything and does not eat out.  No soda consumption.  She reports some right upper quadrant pain that has been present for a few months now.  No change in appetite.  No nausea, vomiting.  No skin discoloration.  She did drink 2 beers recently in efforts to diurese but did not find that helpful either.  She is been avoiding Tylenol.  2.  Low back pain Patient reports longstanding history of low back pain on the left.  Denies any sciatica.  No falls.  No reported weakness.  She uses ibuprofen as above.  3.  Lump on hand Patient reports over the last several weeks she has noticed a painful lump on the back of her right wrist.  She does report associated numbness and tingling that goes down into the middle finger.  No treatments used for this.   ROS: Per HPI  No Known Allergies Past Medical History:  Diagnosis Date  . Allergy   . Anxiety   . GERD (gastroesophageal reflux disease)   . Headache(784.0)   . 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  . Thyroid disease    Hashimotos     Current Outpatient Medications:  .  acetaminophen (MIDOL) 650 MG CR tablet, Take 650 mg by mouth every 8  (eight) hours as needed (for menstrual pain/cramps)., Disp: , Rfl:  .  ergocalciferol (VITAMIN D2) 1.25 MG (50000 UT) capsule, Take 1 capsule by mouth once a week., Disp: , Rfl:  .  escitalopram (LEXAPRO) 20 MG tablet, Take 1 tablet (20 mg total) by mouth daily., Disp: 90 tablet, Rfl: 1 .  furosemide (LASIX) 20 MG tablet, Take 0.5 tablets (10 mg total) by mouth daily., Disp: 30 tablet, Rfl: 1 .  ibuprofen (ADVIL,MOTRIN) 200 MG tablet, Take 600-800 mg by mouth every 8 (eight) hours as needed (for pain/headache)., Disp: , Rfl:  .  lidocaine (LIDODERM) 5 %, Place 1 patch onto the skin daily. Remove & Discard patch within 12 hours or as directed by MD, Disp: 30 patch, Rfl: 0 .  naproxen (NAPROSYN) 500 MG tablet, Take 1 tablet (500 mg total) by mouth 2 (two) times daily with a meal. (as needed for wrist pain), Disp: 30 tablet, Rfl: 0 .  TIROSINT-SOL 125 MCG/ML SOLN, TAKE 2 ML BY MOUTH IN THE MORNING BEFORE BREAKFAST ONCE A DAY, Disp: , Rfl:  Social History   Socioeconomic History  . Marital status: Single    Spouse name: Not on file  . Number of children: Not on file  . Years of education: Not on file  . Highest education level: Not on file  Occupational History  . Occupation: Scientist, water quality    Comment: Therapist, art  Social Needs  . Financial resource strain: Not on file  . Food insecurity    Worry: Not on file    Inability: Not on file  . Transportation needs    Medical: Not on file    Non-medical: Not on file  Tobacco Use  . Smoking status: Current Some Day Smoker    Packs/day: 0.50    Years: 2.00    Pack years: 1.00    Types: Cigarettes  . Smokeless tobacco: Never Used  Substance and Sexual Activity  . Alcohol use: Not Currently    Comment: rare  . Drug use: No  . Sexual activity: Never    Birth control/protection: None  Lifestyle  . Physical activity    Days per week: Not on file    Minutes per session: Not on file  . Stress: Not on file  Relationships  . Social Product manager on phone: Not on file    Gets together: Not on file    Attends religious service: Not on file    Active member of club or organization: Not on file    Attends meetings of clubs or organizations: Not on file    Relationship status: Not on file  . Intimate partner violence    Fear of current or ex partner: Not on file    Emotionally abused: Not on file    Physically abused: Not on file    Forced sexual activity: Not on file  Other Topics Concern  . Not on file  Social History Narrative  . Not on file   Family History  Problem Relation Age of Onset  . Heart attack Maternal Grandmother   . Heart Problems Maternal Grandfather   . Heart attack Maternal Grandfather   . Cancer Paternal Grandmother   . Cancer Paternal Grandfather   . Diabetes Mother   . Other Mother        gastropresis  . Anxiety disorder Mother   . Other Father        syncope-has a pacemaker  . COPD Father     Objective: Office vital signs reviewed. BP 100/62   Pulse 80   Temp (!) 96.8 F (36 C) (Temporal)   Ht 5' 6"  (1.676 m)   Wt (!) 324 lb (147 kg)   SpO2 97%   BMI 52.29 kg/m   Physical Examination:  General: Awake, alert, obese. No acute distress HEENT: Normal; sclera white Cardio: regular rate   Pulm: Normal work of breathing on room air.  No wheezes GI: soft, obese.  Mild right upper quadrant tenderness to palpation.  No rebound or guarding.  Bowel sounds are present.  Exam is somewhat limited by body habitus but liver does not feel enlarged. Extremities: warm, well perfused, mild nonpitting edema noted.  No cyanosis or clubbing; +2 pulses bilaterally MSK: Normal gait and station  Lumbar spine: No midline tenderness to palpation.  Minimal paraspinal muscle tenderness of patient along the left side.  She has full active range of motion of bilateral lower extremities  Right wrist: She has a small palpable ganglion cyst noted along the dorsal aspect of the right wrist.  No discoloration of the  hands noted.  She has full active range of motion of the hand and fingers. Skin: dry; intact; no rashes or lesions Neuro: Light touch and station grossly intact  Assessment/ Plan: 23 y.o. female   1. Generalized edema  Uncertain etiology.  I am somewhat concerned that she has had persistently elevated liver function test and impaired renal function.  We are going to work these up further to see if perhaps this is contributing to her generalized edema.  Of course she also has uncontrolled hypothyroidism with recent need for increase of her thyroid medication dose.  She has had a 3 pound gain in weight since her visit with Sharyn Lull a few weeks ago.  Since our last visit in February she has gained almost 30 pounds. - ANA w/Reflex if Positive - C-reactive protein - Sedimentation Rate - CBC  2. Impaired renal function Uncertain etiology.  Advised avoid NSAIDs.  For now, we will keep Lasix but we discussed monitoring blood pressure very closely.  May need to consider referral to nephrology for further evaluation as this seems to be new onset. - CMP14+EGFR - CBC  3. Elevated liver function tests Uncertain etiology.  Will obtain ultrasound of the liver given right upper quadrant pain.  Also check hepatitis panel. - CMP14+EGFR - US Abdomen Limited RUQ; Future - Hepatitis panel, acute - CBC  4. Ganglion cyst Referral to orthopedics placed.  We discussed compression and ice. - Ambulatory referral to Orthopedic Surgery  5. Acute left-sided low back pain without sciatica Acute on chronic.  Referral to orthopedics placed.  We discussed weight loss and core exercises to improve low back. - Ambulatory referral to Orthopedic Surgery   Orders Placed This Encounter  Procedures  . US Abdomen Limited RUQ    Standing Status:   Future    Standing Expiration Date:   02/08/2020    Order Specific Question:   Reason for Exam (SYMPTOM  OR DIAGNOSIS REQUIRED)    Answer:   elevated lfts, RUQ pain    Order  Specific Question:   Preferred imaging location?    Answer:   [facility]  . ANA w/Reflex if Positive  . C-reactive protein  . Sedimentation Rate  . CMP14+EGFR  . Hepatitis panel, acute  . CBC  . Ambulatory referral to Orthopedic Surgery    Referral Priority:   Routine    Referral Type:   Surgical    Referral Reason:   Specialty Services Required    Requested Specialty:   Orthopedic Surgery    Number of Visits Requested:   1   No orders of the defined types were placed in this encounter.    Janet Norlander, Janet [name] [place] [phone]

## 2018-12-08 NOTE — Patient Instructions (Signed)
I would hold off on changing your diuretic just yet.  I would like to get your kidney function liver function back first.  Your last couple of kidney function tests showed abnormal kidney function.  This may be the cause of why you are retaining fluid.  I may consider referring you to a specialist if your kidneys continue to look impaired.  I have sent in a referral for orthopedist to address the ganglion cyst on your hand and your low back pain.  I have ordered an ultrasound of your liver.  Your liver function tests were abnormal during your last couple of checks.  Again, this also can contribute to fluid retention.  Ganglion Cyst  A ganglion cyst is a non-cancerous, fluid-filled lump that occurs near a joint or tendon. The cyst grows out of a joint or the lining of a tendon. Ganglion cysts most often develop in the hand or wrist, but they can also develop in the shoulder, elbow, hip, knee, ankle, or foot. Ganglion cysts are ball-shaped or egg-shaped. Their size can range from the size of a pea to larger than a grape. Increased activity may cause the cyst to get bigger because more fluid starts to build up. What are the causes? The exact cause of this condition is not known, but it may be related to:  Inflammation or irritation around the joint.  An injury.  Repetitive movements or overuse.  Arthritis. What increases the risk? You are more likely to develop this condition if:  You are a woman.  You are 57-40 years old. What are the signs or symptoms? The main symptom of this condition is a lump. It most often appears on the hand or wrist. In many cases, there are no other symptoms, but a cyst can sometimes cause:  Tingling.  Pain.  Numbness.  Muscle weakness.  Weak grip.  Less range of motion in a joint. How is this diagnosed? Ganglion cysts are usually diagnosed based on a physical exam. Your health care provider will feel the lump and may shine a light next to it. If it is  a ganglion cyst, the light will likely shine through it. Your health care provider may order an X-ray, ultrasound, or MRI to rule out other conditions. How is this treated? Ganglion cysts often go away on their own without treatment. If you have pain or other symptoms, treatment may be needed. Treatment is also needed if the ganglion cyst limits your movement or if it gets infected. Treatment may include:  Wearing a brace or splint on your wrist or finger.  Taking anti-inflammatory medicine.  Having fluid drained from the lump with a needle (aspiration).  Getting a steroid injected into the joint.  Having surgery to remove the ganglion cyst.  Placing a pad on your shoe or wearing shoes that will not rub against the cyst if it is on your foot. Follow these instructions at home:  Do not press on the ganglion cyst, poke it with a needle, or hit it.  Take over-the-counter and prescription medicines only as told by your health care provider.  If you have a brace or splint: ? Wear it as told by your health care provider. ? Remove it as told by your health care provider. Ask if you need to remove it when you take a shower or a bath.  Watch your ganglion cyst for any changes.  Keep all follow-up visits as told by your health care provider. This is important. Contact a health  care provider if:  Your ganglion cyst becomes larger or more painful.  You have pus coming from the lump.  You have weakness or numbness in the affected area.  You have a fever or chills. Get help right away if:  You have a fever and have any of these in the cyst area: ? Increased redness. ? Red streaks. ? Swelling. Summary  A ganglion cyst is a non-cancerous, fluid-filled lump that occurs near a joint or tendon.  Ganglion cysts most often develop in the hand or wrist, but they can also develop in the shoulder, elbow, hip, knee, ankle, or foot.  Ganglion cysts often go away on their own without  treatment. This information is not intended to replace advice given to you by your health care provider. Make sure you discuss any questions you have with your health care provider. Document Released: 12/22/1999 Document Revised: 12/06/2016 Document Reviewed: 08/23/2016 Elsevier Patient Education  2020 [employer].

## 2018-12-09 ENCOUNTER — Other Ambulatory Visit: Payer: Self-pay | Admitting: Family Medicine

## 2018-12-09 DIAGNOSIS — R7989 Other specified abnormal findings of blood chemistry: Secondary | ICD-10-CM

## 2018-12-09 DIAGNOSIS — R601 Generalized edema: Secondary | ICD-10-CM

## 2018-12-09 LAB — CMP14+EGFR
ALT: 27 IU/L (ref 0–32)
AST: 54 IU/L — ABNORMAL HIGH (ref 0–40)
Albumin/Globulin Ratio: 2 (ref 1.2–2.2)
Albumin: 4.8 g/dL (ref 3.9–5.0)
Alkaline Phosphatase: 69 IU/L (ref 39–117)
BUN/Creatinine Ratio: 13 (ref 9–23)
BUN: 17 mg/dL (ref 6–20)
Bilirubin Total: 0.2 mg/dL (ref 0.0–1.2)
CO2: 18 mmol/L — ABNORMAL LOW (ref 20–29)
Calcium: 9 mg/dL (ref 8.7–10.2)
Chloride: 100 mmol/L (ref 96–106)
Creatinine, Ser: 1.29 mg/dL — ABNORMAL HIGH (ref 0.57–1.00)
GFR calc Af Amer: 67 mL/min/{1.73_m2} (ref >59)
GFR calc non Af Amer: 58 mL/min/{1.73_m2} — ABNORMAL LOW (ref >59)
Globulin, Total: 2.4 g/dL (ref 1.5–4.5)
Glucose: 72 mg/dL (ref 65–99)
Potassium: 3.8 mmol/L (ref 3.5–5.2)
Sodium: 137 mmol/L (ref 134–144)
Total Protein: 7.2 g/dL (ref 6.0–8.5)

## 2018-12-09 LAB — HEPATITIS PANEL, ACUTE
Hep A IgM: NEGATIVE
Hep B C IgM: NEGATIVE
Hep C Virus Ab: <0.1 s/co ratio (ref 0.0–0.9)
Hepatitis B Surface Ag: NEGATIVE

## 2018-12-09 LAB — CBC
Hematocrit: 32.5 % — ABNORMAL LOW (ref 34.0–46.6)
Hemoglobin: 11.2 g/dL (ref 11.1–15.9)
MCH: 32.7 pg (ref 26.6–33.0)
MCHC: 34.5 g/dL (ref 31.5–35.7)
MCV: 95 fL (ref 79–97)
Platelets: 256 10*3/uL (ref 150–450)
RBC: 3.43 x10E6/uL — ABNORMAL LOW (ref 3.77–5.28)
RDW: 13.5 % (ref 11.7–15.4)
WBC: 10.9 10*3/uL — ABNORMAL HIGH (ref 3.4–10.8)

## 2018-12-09 LAB — SEDIMENTATION RATE: Sed Rate: 27 mm/hr (ref 0–32)

## 2018-12-09 LAB — C-REACTIVE PROTEIN: CRP: 2 mg/L (ref 0–10)

## 2018-12-09 LAB — ANA W/REFLEX IF POSITIVE: Anti Nuclear Antibody (ANA): NEGATIVE

## 2018-12-14 ENCOUNTER — Telehealth: Payer: Self-pay | Admitting: Family Medicine

## 2018-12-14 NOTE — Telephone Encounter (Signed)
Patient was seen on 12/08/2018 by Dr. Lajuana Ripple for generalized edema.  She had previously been seen by Sharyn Lull and started on Lasix.  Dr. Lajuana Ripple had labs performed which showed abnormal kidney and liver function.  Patient is scheduled to see a kidney specialist and is also scheduled for an ultrasound of her liver tomorrow.  Mom and patient are concerned because she has had increased swelling since the appointment and is now complaining of shortness of breath with activity.  Please advise.

## 2018-12-14 NOTE — Telephone Encounter (Signed)
Yes if she is developing shortness of breath then she likely should go to the emergency department.

## 2018-12-15 ENCOUNTER — Emergency Department (HOSPITAL_COMMUNITY)
Admission: EM | Admit: 2018-12-15 | Discharge: 2018-12-15 | Disposition: A | Discharge status: Home / Self Care | Payer: BC Managed Care – PPO | Attending: Emergency Medicine | Admitting: Emergency Medicine

## 2018-12-15 ENCOUNTER — Encounter (HOSPITAL_COMMUNITY): Payer: Self-pay | Admitting: Emergency Medicine

## 2018-12-15 ENCOUNTER — Ambulatory Visit (HOSPITAL_COMMUNITY)
Admission: RE | Admit: 2018-12-15 | Discharge: 2018-12-15 | Disposition: A | Discharge status: Home / Self Care | Payer: BC Managed Care – PPO | Source: Ambulatory Visit | Attending: Family Medicine | Admitting: Family Medicine

## 2018-12-15 ENCOUNTER — Other Ambulatory Visit: Payer: Self-pay

## 2018-12-15 ENCOUNTER — Emergency Department (HOSPITAL_COMMUNITY): Payer: BC Managed Care – PPO

## 2018-12-15 DIAGNOSIS — Z79899 Other long term (current) drug therapy: Secondary | ICD-10-CM | POA: Insufficient documentation

## 2018-12-15 DIAGNOSIS — E063 Autoimmune thyroiditis: Secondary | ICD-10-CM | POA: Insufficient documentation

## 2018-12-15 DIAGNOSIS — K760 Fatty (change of) liver, not elsewhere classified: Secondary | ICD-10-CM | POA: Diagnosis not present

## 2018-12-15 DIAGNOSIS — F17210 Nicotine dependence, cigarettes, uncomplicated: Secondary | ICD-10-CM | POA: Insufficient documentation

## 2018-12-15 DIAGNOSIS — R2243 Localized swelling, mass and lump, lower limb, bilateral: Secondary | ICD-10-CM | POA: Diagnosis not present

## 2018-12-15 DIAGNOSIS — R7989 Other specified abnormal findings of blood chemistry: Secondary | ICD-10-CM

## 2018-12-15 DIAGNOSIS — R609 Edema, unspecified: Secondary | ICD-10-CM

## 2018-12-15 DIAGNOSIS — R531 Weakness: Secondary | ICD-10-CM | POA: Diagnosis not present

## 2018-12-15 DIAGNOSIS — R0602 Shortness of breath: Secondary | ICD-10-CM | POA: Diagnosis not present

## 2018-12-15 DIAGNOSIS — R600 Localized edema: Secondary | ICD-10-CM | POA: Diagnosis not present

## 2018-12-15 LAB — CBC
HCT: 32.3 % — ABNORMAL LOW (ref 36.0–46.0)
Hemoglobin: 10.5 g/dL — ABNORMAL LOW (ref 12.0–15.0)
MCH: 32 pg (ref 26.0–34.0)
MCHC: 32.5 g/dL (ref 30.0–36.0)
MCV: 98.5 fL (ref 80.0–100.0)
Platelets: 252 10*3/uL (ref 150–400)
RBC: 3.28 MIL/uL — ABNORMAL LOW (ref 3.87–5.11)
RDW: 13.5 % (ref 11.5–15.5)
WBC: 9.8 10*3/uL (ref 4.0–10.5)
nRBC: 0 % (ref 0.0–0.2)

## 2018-12-15 LAB — COMPREHENSIVE METABOLIC PANEL
ALT: 33 U/L (ref 0–44)
AST: 59 U/L — ABNORMAL HIGH (ref 15–41)
Albumin: 4.4 g/dL (ref 3.5–5.0)
Alkaline Phosphatase: 63 U/L (ref 38–126)
Anion gap: 11 (ref 5–15)
BUN: 17 mg/dL (ref 6–20)
CO2: 23 mmol/L (ref 22–32)
Calcium: 9.1 mg/dL (ref 8.9–10.3)
Chloride: 104 mmol/L (ref 98–111)
Creatinine, Ser: 1.2 mg/dL — ABNORMAL HIGH (ref 0.44–1.00)
GFR calc Af Amer: >60 mL/min (ref >60)
GFR calc non Af Amer: >60 mL/min (ref >60)
Glucose, Bld: 94 mg/dL (ref 70–99)
Potassium: 3.6 mmol/L (ref 3.5–5.1)
Sodium: 138 mmol/L (ref 135–145)
Total Bilirubin: 0.6 mg/dL (ref 0.3–1.2)
Total Protein: 7.7 g/dL (ref 6.5–8.1)

## 2018-12-15 LAB — BRAIN NATRIURETIC PEPTIDE: B Natriuretic Peptide: 24 pg/mL (ref 0.0–100.0)

## 2018-12-15 MED ORDER — FUROSEMIDE 20 MG PO TABS: 20.0000 mg | ORAL_TABLET | Freq: Every day | ORAL | 0 refills | Status: DC

## 2018-12-15 MED ORDER — FUROSEMIDE 40 MG PO TABS
20.0000 mg | ORAL_TABLET | Freq: Once | ORAL | Status: AC
  Administered 2018-12-15: 22:00:00 20 mg via ORAL
  Filled 2018-12-15: qty 1

## 2018-12-15 NOTE — ED Triage Notes (Signed)
Pt reports fluid retention all over body x 2 months.  Has seen her regular Dr, was put on fluid pills but then taken off.

## 2018-12-15 NOTE — Telephone Encounter (Signed)
Pt mother aware and states pt went to work yesterday and mother voiced understanding if she develops SOB to go to ED.

## 2018-12-15 NOTE — ED Provider Notes (Addendum)
[facility] Provider Note   CSN: [id number] Arrival date & time: 12/15/18  1223     History   Chief Complaint Chief Complaint  Patient presents with  . Leg Swelling    HPI Janet [name] is a 23 y.o. female.     Patient complains of swelling in her legs  The history is provided by the patient. No language interpreter was used.  Weakness Severity:  Mild Onset quality:  Gradual Timing:  Constant Progression:  Worsening Chronicity:  Recurrent Context: not alcohol use   Relieved by:  Nothing Worsened by:  Nothing Ineffective treatments:  None tried Associated symptoms: no abdominal pain, no chest pain, no cough, no diarrhea, no frequency, no headaches and no seizures     Past Medical History:  Diagnosis Date  . Allergy   . Anxiety   . GERD (gastroesophageal reflux disease)   . Headache(784.0)   . 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  . Thyroid disease    Hashimotos     Patient Active Problem List   Diagnosis Date Noted  . Swelling of both hands 09/16/2017  . Swelling of both lower extremities 09/16/2017  . Lumbar strain, initial encounter 09/16/2017  . Hypothyroidism 06/30/2017  . GAD (generalized anxiety disorder) 06/05/2017  . Morbid obesity with BMI of 40.0-44.9, adult (HCC) 06/05/2017  . Dysmenorrhea 12/28/2014  . Tobacco abuse 12/07/2014  . GERD (gastroesophageal reflux disease) 01/21/2014  . Hashimoto's thyroiditis 01/26/2013  . Migraine without aura, without mention of intractable migraine without mention of status migrainosus 01/15/2013  . Episodic tension type headache 01/15/2013  . Obesity 01/15/2013  . Insomnia, unspecified 01/15/2013  . Dysfunctions associated with sleep stages or arousal from sleep 01/15/2013    Past Surgical History:  Procedure Laterality Date  . THYROIDECTOMY N/A 07/01/2017   Procedure: TOTAL THYROIDECTOMY;  Surgeon: Darnell LevelGerkin, Todd, MD;  Location: [facility];  Service: General;  Laterality: N/A;   . TONSILLECTOMY AND ADENOIDECTOMY Bilateral 01/2011  . TOTAL THYROIDECTOMY     Dr. Gerrit FriendsGerkin 07-01-17     OB History   No obstetric history on file.      Home Medications    Prior to Admission medications   Medication Sig Start Date End Date Taking? Authorizing Provider  acetaminophen (MIDOL) 650 MG CR tablet Take 650 mg by mouth every 8 (eight) hours as needed (for menstrual pain/cramps).    [provider]  ergocalciferol (VITAMIN D2) 1.25 MG (50000 UT) capsule Take 1 capsule by mouth once a week. 08/19/18   [provider]  escitalopram (LEXAPRO) 20 MG tablet Take 1 tablet (20 mg total) by mouth daily. 08/19/17   Raliegh IpGottschalk, Ashly M, DO  furosemide (LASIX) 20 MG tablet Take 1 tablet (20 mg total) by mouth daily. 12/15/18   Bethann BerkshireZammit, Dany Harten, MD  ibuprofen (ADVIL,MOTRIN) 200 MG tablet Take 600-800 mg by mouth every 8 (eight) hours as needed (for pain/headache).    [provider]  lidocaine (LIDODERM) 5 % Place 1 patch onto the skin daily. Remove & Discard patch within 12 hours or as directed by MD 09/16/17   Rakes, Doralee AlbinoLinda M, FNP  naproxen (NAPROSYN) 500 MG tablet Take 1 tablet (500 mg total) by mouth 2 (two) times daily with a meal. (as needed for wrist pain) 02/24/18   Gottschalk, Ashly M, DO  TIROSINT-SOL 125 MCG/ML SOLN TAKE 2 ML BY MOUTH IN THE MORNING BEFORE BREAKFAST ONCE A DAY 08/19/18   [provider]    Family History  Family History  Problem Relation Age of Onset  . Heart attack Maternal Grandmother   . Heart Problems Maternal Grandfather   . Heart attack Maternal Grandfather   . Cancer Paternal Grandmother   . Cancer Paternal Grandfather   . Diabetes Mother   . Other Mother        gastropresis  . Anxiety disorder Mother   . Other Father        syncope-has a pacemaker  . COPD Father     Social History Social History   Tobacco Use  . Smoking status: Current Some Day Smoker    Packs/day: 0.50    Years: 2.00    Pack years: 1.00     Types: Cigarettes  . Smokeless tobacco: Never Used  Substance Use Topics  . Alcohol use: Not Currently    Comment: rare  . Drug use: No     Allergies   Patient has no known allergies.   Review of Systems Review of Systems  Constitutional: Negative for appetite change and fatigue.  HENT: Negative for congestion, ear discharge and sinus pressure.   Eyes: Negative for discharge.  Respiratory: Negative for cough.   Cardiovascular: Negative for chest pain.  Gastrointestinal: Negative for abdominal pain and diarrhea.  Genitourinary: Negative for frequency and hematuria.  Musculoskeletal: Negative for back pain.       Mild edema in her legs  Skin: Negative for rash.  Neurological: Positive for weakness. Negative for seizures and headaches.  Psychiatric/Behavioral: Negative for hallucinations.     Physical Exam Updated Vital Signs BP 118/80 (BP Location: Right Arm)   Pulse 78   Temp 98.3 F (36.8 C) (Oral)   Resp 19   Ht 5\' 6"  (1.676 m)   Wt (!) 147 kg   LMP 12/11/2018   SpO2 99%   BMI 52.29 kg/m   Physical Exam Vitals and nursing note reviewed.  Constitutional:      Appearance: She is well-developed.  HENT:     Head: Normocephalic.     Nose: Nose normal.  Eyes:     General: No scleral icterus.    Conjunctiva/sclera: Conjunctivae normal.  Neck:     Thyroid: No thyromegaly.  Cardiovascular:     Rate and Rhythm: Normal rate and regular rhythm.     Heart sounds: No murmur. No friction rub. No gallop.   Pulmonary:     Breath sounds: No stridor. No wheezing or rales.  Chest:     Chest wall: No tenderness.  Abdominal:     General: There is no distension.     Tenderness: There is no abdominal tenderness. There is no rebound.  Musculoskeletal:        General: Normal range of motion.     Cervical back: Neck supple.     Comments: Mild edema   Lymphadenopathy:     Cervical: No cervical adenopathy.  Skin:    Findings: No erythema or rash.  Neurological:      Mental Status: She is alert and oriented to person, place, and time.     Motor: No abnormal muscle tone.     Coordination: Coordination normal.  Psychiatric:        Behavior: Behavior normal.      ED Treatments / Results  Labs (all labs ordered are listed, but only abnormal results are displayed) Labs Reviewed  COMPREHENSIVE METABOLIC PANEL - Abnormal; Notable for the following components:      Result Value   Creatinine, Ser 1.20 (*)  AST 59 (*)    All other components within normal limits  CBC - Abnormal; Notable for the following components:   RBC 3.28 (*)    Hemoglobin 10.5 (*)    HCT 32.3 (*)    All other components within normal limits  BRAIN NATRIURETIC PEPTIDE    EKG EKG Interpretation  Date/Time:  Tuesday December 15 2018 21:29:43 EST Ventricular Rate:  53 PR Interval:    QRS Duration: 102 QT Interval:  648 QTC Calculation: 609 R Axis:   94 Text Interpretation: Sinus rhythm Borderline right axis deviation Low voltage, precordial leads Nonspecific T abnormalities, diffuse leads Prolonged QT interval Confirmed by Milton Ferguson [phone]) on 12/15/2018 9:43:06 PM   Radiology Dg Chest Portable 1 View  Result Date: 12/15/2018 CLINICAL DATA:  Shortness of breath and fluid retention EXAM: PORTABLE CHEST 1 VIEW COMPARISON:  06/18/2017 FINDINGS: Cardiac shadow is within normal limits. Mild atelectatic changes are noted in the bases bilaterally. No sizable effusion is seen. No focal infiltrate is noted. No bony abnormality is seen. IMPRESSION: Mild bibasilar atelectasis. Electronically Signed   By: Inez Catalina M.D.   On: 12/15/2018 21:13   US Abdomen Limited Ruq  Result Date: 12/15/2018 CLINICAL DATA:  Increased LFTs, right upper quadrant pain for 1.5 years EXAM: ULTRASOUND ABDOMEN LIMITED RIGHT UPPER QUADRANT COMPARISON:  Abdominal ultrasound dated 02/14/2015 FINDINGS: Gallbladder: No gallstones or wall thickening visualized. No sonographic Murphy sign noted by  sonographer. Common bile duct: Diameter: 4 mm Liver: No focal lesion identified. Liver echogenicity is increased, consistent with hepatic steatosis. Portal vein is patent on color Doppler imaging with normal direction of blood flow towards the liver. Other: None. IMPRESSION: Hepatic steatosis. Electronically Signed   By: Zerita Boers M.D.   On: 12/15/2018 14:45    Procedures Procedures (including critical care time)  Medications Ordered in ED Medications  furosemide (LASIX) tablet 20 mg (has no administration in time range)     Initial Impression / Assessment and Plan / ED Course  I have reviewed the triage vital signs and the nursing notes.  Pertinent labs & imaging results that were available during my care of the patient were reviewed by me and considered in my medical decision making (see chart for details).      Labs unremarkable.  Patient with peripheral edema and will be placed on Lasix and follow-up with PCP   Final Clinical Impressions(s) / ED Diagnoses   Final diagnoses:  Peripheral edema    ED Discharge Orders         Ordered    furosemide (LASIX) 20 MG tablet  Daily     12/15/18 2149           Milton Ferguson, MD 12/18/18 2376    Milton Ferguson, MD 12/30/18 [phone]

## 2018-12-15 NOTE — Discharge Instructions (Addendum)
Follow-up with the nephrologist as planned

## 2018-12-16 DIAGNOSIS — J309 Allergic rhinitis, unspecified: Secondary | ICD-10-CM | POA: Diagnosis not present

## 2018-12-16 DIAGNOSIS — E559 Vitamin D deficiency, unspecified: Secondary | ICD-10-CM | POA: Diagnosis not present

## 2018-12-16 DIAGNOSIS — E039 Hypothyroidism, unspecified: Secondary | ICD-10-CM | POA: Diagnosis not present

## 2018-12-17 ENCOUNTER — Ambulatory Visit: Payer: Self-pay

## 2018-12-17 ENCOUNTER — Ambulatory Visit (INDEPENDENT_AMBULATORY_CARE_PROVIDER_SITE_OTHER): Payer: BC Managed Care – PPO | Admitting: Orthopaedic Surgery

## 2018-12-17 ENCOUNTER — Encounter: Payer: Self-pay | Admitting: Orthopaedic Surgery

## 2018-12-17 ENCOUNTER — Other Ambulatory Visit: Payer: Self-pay

## 2018-12-17 VITALS — BP 129/90 | HR 70 | Ht 66.0 in | Wt 324.0 lb

## 2018-12-17 DIAGNOSIS — M545 Low back pain, unspecified: Secondary | ICD-10-CM

## 2018-12-17 DIAGNOSIS — R200 Anesthesia of skin: Secondary | ICD-10-CM | POA: Insufficient documentation

## 2018-12-17 DIAGNOSIS — M79641 Pain in right hand: Secondary | ICD-10-CM | POA: Diagnosis not present

## 2018-12-17 NOTE — Progress Notes (Signed)
Office Visit Note   Patient: Janet [name]           Date of Birth: Jan 31, 1995           MRN: [id number] Visit Date: 12/17/2018              Requested by: [name], DO [address] [place],  [place] [address] PCP: [name], DO   Assessment & Plan: Visit Diagnoses:  1. Pain in right hand   2. Acute bilateral low back pain, unspecified whether sciatica present   3. Numbness of right hand     Plan: Patient works at [employer] and can get a splint at [employer] with a discount to immobilize the wrist she can wear it at night and when she is driving.  She likely has carpal tunnel syndrome with weight gain and some fluid retention.  I will check her back again in 5 weeks we can consider carpal tunnel injection versus electrical tests if she is having persistent symptoms.  We discussed walking program to help with her back.  If her symptoms persist we can consider physical therapy referral for the lumbar spine.  She will work on a walking program for weight loss.  Follow-Up Instructions: Return in about 5 weeks (around 01/21/2019).   Orders:  Orders Placed This Encounter  Procedures  . XR Lumbar Spine 2-3 Views  . XR Hand Complete Right   No orders of the defined types were placed in this encounter.     Procedures: No procedures performed   Clinical Data: No additional findings.   Subjective: Chief Complaint  Patient presents with  . Lower Back - Pain  . Right Hand - Pain    HPI 23 year old female seen with right hand numbness primarily index and long finger she is right-hand dominant.  Her symptoms have progressed she states she drops objects it bothers her when she drives.  She is also associated low back pain.  She has had some problems with fluid retention previous thyroidectomy for Hashimoto's thyroiditis she is on thyroid supplement and has latest thyroid labs are pending.  Blood pressure in the past of been good by chart review today is 129/90 she does not  take anti-inflammatories.  She states she had some abnormal kidney function is seeing a nephrologist shortly.  Problems with obesity 563 124pounds.  Patient works at [employer] in the home [place] and travels between stores.  Patient's been on Lasix for fluid retention.  Low back pain not associated with any bowel bladder symptoms.  Review of Systems possible thyroid condition as above fluid retention elevated creatinine, obesity, right hand numbness.  1/2 pack/day smoker.  Previous tonsillectomy.  Thyroidectomy 2018.   Objective: Vital Signs: BP 129/90   Pulse 70   Ht 5\' 6"  (1.676 m)   Wt (!) 324 lb (147 kg)   LMP 12/11/2018   BMI 52.29 kg/m   Physical Exam Constitutional:      Appearance: She is well-developed.  HENT:     Head: Normocephalic.     Right Ear: External ear normal.     Left Ear: External ear normal.  Eyes:     Pupils: Pupils are equal, round, and reactive to light.  Neck:     Thyroid: No thyromegaly.     Trachea: No tracheal deviation.  Cardiovascular:     Rate and Rhythm: Normal rate.  Pulmonary:     Effort: Pulmonary effort is normal.  Abdominal:  Palpations: Abdomen is soft.  Skin:    General: Skin is warm and dry.  Neurological:     Mental Status: She is alert and oriented to person, place, and time.  Psychiatric:        Behavior: Behavior normal.     Ortho Exam patient good cervical range of motion negative Spurling no brachial plexus tenderness.  No thenar atrophy.  Pain with carpal compression.  Positive Phalen's test right negative left.  No triggering of the digits.  She has some discomfort making a fist on the right hand more than left.  No extensor weakness.  Negative straight leg raising right and left.  Normal heel toe gait.  She is able to heel and toe walk.  Specialty Comments:  No specialty comments available.  Imaging: No results found.   PMFS History: Patient Active Problem List   Diagnosis Date Noted  . Numbness of  right hand 12/17/2018  . Swelling of both hands 09/16/2017  . Swelling of both lower extremities 09/16/2017  . Lumbar strain, initial encounter 09/16/2017  . Hypothyroidism 06/30/2017  . GAD (generalized anxiety disorder) 06/05/2017  . Morbid obesity with BMI of 40.0-44.9, adult (HCC) 06/05/2017  . Dysmenorrhea 12/28/2014  . Tobacco abuse 12/07/2014  . GERD (gastroesophageal reflux disease) 01/21/2014  . Hashimoto's thyroiditis 01/26/2013  . Migraine without aura, without mention of intractable migraine without mention of status migrainosus 01/15/2013  . Episodic tension type headache 01/15/2013  . Obesity 01/15/2013  . Insomnia, unspecified 01/15/2013  . Dysfunctions associated with sleep stages or arousal from sleep 01/15/2013   Past Medical History:  Diagnosis Date  . Allergy   . Anxiety   . GERD (gastroesophageal reflux disease)   . Headache(784.0)   . 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  . Thyroid disease    Hashimotos     Family History  Problem Relation Age of Onset  . Heart attack Maternal Grandmother   . Heart Problems Maternal Grandfather   . Heart attack Maternal Grandfather   . Cancer Paternal Grandmother   . Cancer Paternal Grandfather   . Diabetes Mother   . Other Mother        gastropresis  . Anxiety disorder Mother   . Other Father        syncope-has a pacemaker  . COPD Father     Past Surgical History:  Procedure Laterality Date  . THYROIDECTOMY N/A 07/01/2017   Procedure: TOTAL THYROIDECTOMY;  Surgeon: Darnell Level, MD;  Location: [facility];  Service: General;  Laterality: N/A;  . TONSILLECTOMY AND ADENOIDECTOMY Bilateral 01/2011  . TOTAL THYROIDECTOMY     Dr. Gerrit Friends 07-01-17   Social History   Occupational History  . Occupation: Conservation officer, [employer]    Comment: Clinical biochemist  Tobacco Use  . Smoking status: Current Some Day Smoker    Packs/day: 0.50    Years: 2.00    Pack years: 1.00    Types: Cigarettes  . Smokeless tobacco: Never Used   Substance and Sexual Activity  . Alcohol use: Not Currently    Comment: rare  . Drug use: No  . Sexual activity: Never    Birth control/protection: None

## 2018-12-18 ENCOUNTER — Other Ambulatory Visit: Payer: Self-pay | Admitting: Nephrology

## 2018-12-18 ENCOUNTER — Other Ambulatory Visit (HOSPITAL_COMMUNITY): Payer: Self-pay | Admitting: Nephrology

## 2018-12-18 DIAGNOSIS — R600 Localized edema: Secondary | ICD-10-CM | POA: Diagnosis not present

## 2018-12-18 DIAGNOSIS — R809 Proteinuria, unspecified: Secondary | ICD-10-CM | POA: Diagnosis not present

## 2018-12-18 DIAGNOSIS — N182 Chronic kidney disease, stage 2 (mild): Secondary | ICD-10-CM

## 2018-12-18 DIAGNOSIS — E559 Vitamin D deficiency, unspecified: Secondary | ICD-10-CM | POA: Diagnosis not present

## 2018-12-18 DIAGNOSIS — R079 Chest pain, unspecified: Secondary | ICD-10-CM

## 2018-12-21 DIAGNOSIS — E890 Postprocedural hypothyroidism: Secondary | ICD-10-CM | POA: Diagnosis not present

## 2018-12-21 DIAGNOSIS — E039 Hypothyroidism, unspecified: Secondary | ICD-10-CM | POA: Diagnosis not present

## 2018-12-21 DIAGNOSIS — K909 Intestinal malabsorption, unspecified: Secondary | ICD-10-CM | POA: Diagnosis not present

## 2018-12-21 DIAGNOSIS — K900 Celiac disease: Secondary | ICD-10-CM | POA: Diagnosis not present

## 2018-12-22 ENCOUNTER — Telehealth: Payer: Self-pay | Admitting: Family Medicine

## 2018-12-22 MED ORDER — FERROUS SULFATE 324 (65 FE) MG PO TBEC: 324.0000 mg | DELAYED_RELEASE_TABLET | Freq: Once | ORAL | 3 refills | Status: DC

## 2018-12-22 NOTE — Telephone Encounter (Signed)
As long as it is ok with her nephrologist, Dr Theador Hawthorne, patient can start Ferrous sulfate 365mg  qd.  Recheck CBC in 2-3 months.

## 2018-12-22 NOTE — Telephone Encounter (Signed)
Hospital labs done 12/15/18

## 2018-12-22 NOTE — Telephone Encounter (Signed)
Patient aware and verbalized understanding. °

## 2018-12-24 ENCOUNTER — Ambulatory Visit (HOSPITAL_BASED_OUTPATIENT_CLINIC_OR_DEPARTMENT_OTHER)
Admission: RE | Admit: 2018-12-24 | Discharge: 2018-12-24 | Disposition: A | Discharge status: Home / Self Care | Payer: BC Managed Care – PPO | Source: Ambulatory Visit | Attending: Family Medicine | Admitting: Family Medicine

## 2018-12-24 ENCOUNTER — Ambulatory Visit (HOSPITAL_COMMUNITY)
Admission: RE | Admit: 2018-12-24 | Discharge: 2018-12-24 | Disposition: A | Discharge status: Home / Self Care | Payer: BC Managed Care – PPO | Source: Ambulatory Visit | Attending: Nephrology | Admitting: Nephrology

## 2018-12-24 ENCOUNTER — Other Ambulatory Visit: Payer: Self-pay

## 2018-12-24 DIAGNOSIS — R600 Localized edema: Secondary | ICD-10-CM | POA: Diagnosis not present

## 2018-12-24 DIAGNOSIS — R079 Chest pain, unspecified: Secondary | ICD-10-CM | POA: Diagnosis not present

## 2018-12-24 DIAGNOSIS — N182 Chronic kidney disease, stage 2 (mild): Secondary | ICD-10-CM

## 2018-12-24 DIAGNOSIS — Z79899 Other long term (current) drug therapy: Secondary | ICD-10-CM | POA: Diagnosis not present

## 2018-12-24 DIAGNOSIS — E559 Vitamin D deficiency, unspecified: Secondary | ICD-10-CM | POA: Diagnosis not present

## 2018-12-24 DIAGNOSIS — R809 Proteinuria, unspecified: Secondary | ICD-10-CM | POA: Diagnosis not present

## 2018-12-24 NOTE — Progress Notes (Signed)
*  PRELIMINARY RESULTS* Echocardiogram 2D Echocardiogram has been performed.  Samuel Germany 12/24/2018, 10:12 AM

## 2019-01-04 ENCOUNTER — Other Ambulatory Visit: Payer: BC Managed Care – PPO

## 2019-01-04 ENCOUNTER — Other Ambulatory Visit: Payer: Self-pay

## 2019-01-04 ENCOUNTER — Ambulatory Visit: Payer: BC Managed Care – PPO | Attending: Internal Medicine

## 2019-01-04 DIAGNOSIS — Z20822 Contact with and (suspected) exposure to covid-19: Secondary | ICD-10-CM

## 2019-01-04 DIAGNOSIS — Z20828 Contact with and (suspected) exposure to other viral communicable diseases: Secondary | ICD-10-CM | POA: Diagnosis not present

## 2019-01-06 ENCOUNTER — Ambulatory Visit: Payer: BC Managed Care – PPO | Admitting: Allergy

## 2019-01-06 LAB — NOVEL CORONAVIRUS, NAA: SARS-CoV-2, NAA: DETECTED — AB

## 2019-01-13 ENCOUNTER — Other Ambulatory Visit (HOSPITAL_BASED_OUTPATIENT_CLINIC_OR_DEPARTMENT_OTHER): Payer: Self-pay

## 2019-01-13 DIAGNOSIS — R5383 Other fatigue: Secondary | ICD-10-CM

## 2019-01-13 DIAGNOSIS — R0683 Snoring: Secondary | ICD-10-CM

## 2019-01-19 ENCOUNTER — Other Ambulatory Visit: Payer: Self-pay | Admitting: Family Medicine

## 2019-01-19 DIAGNOSIS — M7989 Other specified soft tissue disorders: Secondary | ICD-10-CM

## 2019-01-29 ENCOUNTER — Other Ambulatory Visit: Payer: Self-pay

## 2019-01-29 ENCOUNTER — Ambulatory Visit: Payer: BC Managed Care – PPO | Attending: Nephrology | Admitting: Neurology

## 2019-01-29 DIAGNOSIS — R0683 Snoring: Secondary | ICD-10-CM

## 2019-01-29 DIAGNOSIS — R5383 Other fatigue: Secondary | ICD-10-CM | POA: Diagnosis not present

## 2019-01-29 DIAGNOSIS — G4733 Obstructive sleep apnea (adult) (pediatric): Secondary | ICD-10-CM | POA: Diagnosis not present

## 2019-02-07 NOTE — Procedures (Signed)
   [facility] Latoya Diskin A. Gerilyn Pilgrim, MD     [email or web address]             HOME SLEEP STUDY  LOCATION: [facility]  Patient Name: Janet [name], Janet [name] Date: 01/29/2019 Gender: Female D.O.B: [birth year] Age (years): 23 Referring Provider: Manpreet Bhutani Height (inches): 66 Interpreting Physician: Beryle Beams MD, ABSM Weight (lbs): 324 RPSGT: Alfonso Ellis BMI: 52 MRN: [id number] Neck Size: CLINICAL INFORMATION Sleep Study Type: HST     Indication for sleep study: Fatigue, Snoring     Epworth Sleepiness Score: NA  SLEEP STUDY TECHNIQUE A multi-channel overnight portable sleep study was performed. The channels recorded were: nasal airflow, thoracic respiratory movement, and oxygen saturation with a pulse oximetry. Snoring was also monitored.  MEDICATIONS Patient self administered medications include: N/A.  Current Outpatient Medications:  .  acetaminophen (MIDOL) 650 MG CR tablet, Take 650 mg by mouth every 8 (eight) hours as needed (for menstrual pain/cramps)., Disp: , Rfl:  .  ergocalciferol (VITAMIN D2) 1.25 MG (50000 UT) capsule, Take 1 capsule by mouth once a week., Disp: , Rfl:  .  escitalopram (LEXAPRO) 20 MG tablet, Take 1 tablet (20 mg total) by mouth daily., Disp: 90 tablet, Rfl: 1 .  ferrous sulfate 324 (65 Fe) MG TBEC, Take 1 tablet (324 mg total) by mouth once for 1 dose., Disp: 30 tablet, Rfl: 3 .  furosemide (LASIX) 20 MG tablet, TAKE 1/2 TABLET BY MOUTH DAILY, Disp: 45 tablet, Rfl: 1 .  ibuprofen (ADVIL,MOTRIN) 200 MG tablet, Take 600-800 mg by mouth every 8 (eight) hours as needed (for pain/headache)., Disp: , Rfl:  .  lidocaine (LIDODERM) 5 %, Place 1 patch onto the skin daily. Remove & Discard patch within 12 hours or as directed by MD, Disp: 30 patch, Rfl: 0 .  naproxen (NAPROSYN) 500 MG tablet, Take 1 tablet (500 mg total) by mouth 2 (two) times daily with a meal. (as needed for wrist pain), Disp: 30 tablet, Rfl: 0 .  TIROSINT-SOL 125  MCG/ML SOLN, TAKE 2 ML BY MOUTH IN THE MORNING BEFORE BREAKFAST ONCE A DAY, Disp: , Rfl:   SLEEP ARCHITECTURE Patient was studied for 528.4 minutes. The sleep efficiency was 97.9 % and the patient was supine for 58.1%. The arousal index was 0.0 per hour.  RESPIRATORY PARAMETERS The overall AHI was 47.4 per hour, with a central apnea index of 1.2 per hour.  The oxygen nadir was 72% during sleep.     CARDIAC DATA Mean heart rate during sleep was 55.8 bpm.  IMPRESSIONS 1. Severe obstructive sleep apnea occurred during this study (AHI = 47.4/h).  AutoPAP 8- 15 is recommended.   Argie Ramming, MD Diplomate, American Board of Sleep Medicine.  ELECTRONICALLY SIGNED ON:  02/07/2019, 7:43 PM [facility] PH: [phone]   FX: [phone] ACCREDITED BY THE AMERICAN ACADEMY OF SLEEP MEDICINE

## 2019-02-10 NOTE — Progress Notes (Signed)
lmtcb

## 2019-02-11 ENCOUNTER — Ambulatory Visit: Payer: BC Managed Care – PPO | Admitting: Orthopaedic Surgery

## 2019-02-15 ENCOUNTER — Ambulatory Visit (INDEPENDENT_AMBULATORY_CARE_PROVIDER_SITE_OTHER): Payer: BC Managed Care – PPO | Admitting: Family Medicine

## 2019-02-15 DIAGNOSIS — R140 Abdominal distension (gaseous): Secondary | ICD-10-CM | POA: Diagnosis not present

## 2019-02-15 DIAGNOSIS — K219 Gastro-esophageal reflux disease without esophagitis: Secondary | ICD-10-CM | POA: Diagnosis not present

## 2019-02-15 MED ORDER — DEXILANT 30 MG PO CPDR: 30.0000 mg | DELAYED_RELEASE_CAPSULE | Freq: Every day | ORAL | 12 refills | Status: DC

## 2019-02-15 NOTE — Patient Instructions (Signed)

## 2019-02-15 NOTE — Progress Notes (Signed)
Telephone visit  Subjective: CC: reflux PCP: [name], DO EPP:IRJJO V Pacetti is a 24 y.o. female calls for telephone consult today. Patient provides verbal consent for consult held via phone.  Due to COVID-19 pandemic this visit was conducted virtually. This visit type was conducted due to national recommendations for restrictions regarding the COVID-19 Pandemic (e.g. social distancing, sheltering in place) in an effort to limit this patient's exposure and mitigate transmission in our community. All issues noted in this document were discussed and addressed.  A physical exam was not performed with this format.   Location of patient: home Location of provider: Working remotely from home Others present for call: none  1. GERD She reports worsening over the last few months.  She has been using an OTC Pepcid AC qd.  She notes symptoms are worse at night time and into the mornings.  She reports reflux into her throat.  She reports intermittent nausea/ vomiting associated with this.  No diarrhea. She has used Dexilant 60mg  in the past, which was given to her by her gastroenterologist back in 2019.  She did feel that that was helpful.  She has not tried any other PPIs since then.  No hematochezia, melena.  She does report some abdominal bloating and early satiety.  Sometimes she feels that the right upper quadrant is full feeling, particularly after eating.  She is also had sharp intermittent right upper quadrant pains that feel like they are coming from her rib cage when she changes positions in the car, etc.  She has had HIDA scans done which showed no gallbladder pathology.  She had an ultrasound done in December which showed fatty liver.  She has not followed up with gastroenterology since 2019.   ROS: Per HPI  No Known Allergies Past Medical History:  Diagnosis Date  . Allergy   . Anxiety   . GERD (gastroesophageal reflux disease)   . Headache(784.0)   . 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  . Thyroid disease    Hashimotos     Current Outpatient Medications:  .  acetaminophen (MIDOL) 650 MG CR tablet, Take 650 mg by mouth every 8 (eight) hours as needed (for menstrual pain/cramps)., Disp: , Rfl:  .  ergocalciferol (VITAMIN D2) 1.25 MG (50000 UT) capsule, Take 1 capsule by mouth once a week., Disp: , Rfl:  .  escitalopram (LEXAPRO) 20 MG tablet, Take 1 tablet (20 mg total) by mouth daily., Disp: 90 tablet, Rfl: 1 .  ferrous sulfate 324 (65 Fe) MG TBEC, Take 1 tablet (324 mg total) by mouth once for 1 dose., Disp: 30 tablet, Rfl: 3 .  furosemide (LASIX) 20 MG tablet, TAKE 1/2 TABLET BY MOUTH DAILY, Disp: 45 tablet, Rfl: 1 .  ibuprofen (ADVIL,MOTRIN) 200 MG tablet, Take 600-800 mg by mouth every 8 (eight) hours as needed (for pain/headache)., Disp: , Rfl:  .  lidocaine (LIDODERM) 5 %, Place 1 patch onto the skin daily. Remove & Discard patch within 12 hours or as directed by MD, Disp: 30 patch, Rfl: 0 .  naproxen (NAPROSYN) 500 MG tablet, Take 1 tablet (500 mg total) by mouth 2 (two) times daily with a meal. (as needed for wrist pain), Disp: 30 tablet, Rfl: 0 .  TIROSINT-SOL 125 MCG/ML SOLN, TAKE 2 ML BY MOUTH IN THE MORNING BEFORE BREAKFAST ONCE A DAY, Disp: , Rfl:   Assessment/ Plan: 24 y.o. female   1. Gastroesophageal reflux disease, unspecified whether esophagitis present Empiric treatment with Dexilant  30 mg daily.  We discussed the reduction in dose to try and get her symptoms under control with the lowest effective dose.  If no significant improvement with a 30 mg, can proceed with 60 mg daily.  She will contact me about this. - Dexlansoprazole (DEXILANT) 30 MG capsule; Take 1 capsule (30 mg total) by mouth daily.  Dispense: 30 capsule; Refill: 12  2. Abdominal bloating I suspect secondary to above.  She is to have the right upper quadrant pain assessed before and there was no gallbladder pathology noted.  She does have fatty liver.  We discussed  consideration of for reevaluation by gastroenterology if symptoms not improved with PPI.  She was good understanding will follow as needed   Start time: 10:56am End time: 11:06am  Total time spent on patient care (including telephone call/ virtual visit): 15 minutes  Emira Eubanks Hulen Skains, DO [facility] [phone]

## 2019-02-22 DIAGNOSIS — K909 Intestinal malabsorption, unspecified: Secondary | ICD-10-CM | POA: Diagnosis not present

## 2019-02-22 DIAGNOSIS — E559 Vitamin D deficiency, unspecified: Secondary | ICD-10-CM | POA: Diagnosis not present

## 2019-02-22 DIAGNOSIS — E039 Hypothyroidism, unspecified: Secondary | ICD-10-CM | POA: Diagnosis not present

## 2019-02-22 DIAGNOSIS — K900 Celiac disease: Secondary | ICD-10-CM | POA: Diagnosis not present

## 2019-02-22 DIAGNOSIS — E890 Postprocedural hypothyroidism: Secondary | ICD-10-CM | POA: Diagnosis not present

## 2019-03-04 DIAGNOSIS — Z6841 Body Mass Index (BMI) 40.0 and over, adult: Secondary | ICD-10-CM | POA: Diagnosis not present

## 2019-03-04 DIAGNOSIS — I129 Hypertensive chronic kidney disease with stage 1 through stage 4 chronic kidney disease, or unspecified chronic kidney disease: Secondary | ICD-10-CM | POA: Diagnosis not present

## 2019-03-04 DIAGNOSIS — N182 Chronic kidney disease, stage 2 (mild): Secondary | ICD-10-CM | POA: Diagnosis not present

## 2019-03-04 DIAGNOSIS — Z79899 Other long term (current) drug therapy: Secondary | ICD-10-CM | POA: Diagnosis not present

## 2019-03-10 ENCOUNTER — Encounter (HOSPITAL_COMMUNITY): Payer: Self-pay

## 2019-03-10 ENCOUNTER — Other Ambulatory Visit: Payer: Self-pay

## 2019-03-10 ENCOUNTER — Encounter (HOSPITAL_COMMUNITY)
Admission: RE | Admit: 2019-03-10 | Discharge: 2019-03-10 | Disposition: A | Discharge status: Home / Self Care | Payer: BC Managed Care – PPO | Source: Ambulatory Visit | Attending: Nephrology | Admitting: Nephrology

## 2019-03-10 DIAGNOSIS — D509 Iron deficiency anemia, unspecified: Secondary | ICD-10-CM | POA: Diagnosis not present

## 2019-03-10 HISTORY — DX: Anemia, unspecified: D64.9

## 2019-03-10 MED ORDER — SODIUM CHLORIDE 0.9 % IV SOLN
510.0000 mg | Freq: Once | INTRAVENOUS | Status: AC
  Administered 2019-03-10: 510 mg via INTRAVENOUS
  Filled 2019-03-10: qty 17

## 2019-03-10 MED ORDER — SODIUM CHLORIDE 0.9 % IV SOLN: Freq: Once | INTRAVENOUS | Status: AC

## 2019-03-17 ENCOUNTER — Other Ambulatory Visit: Payer: Self-pay | Admitting: Family Medicine

## 2019-03-17 ENCOUNTER — Other Ambulatory Visit: Payer: Self-pay

## 2019-03-17 ENCOUNTER — Encounter (HOSPITAL_COMMUNITY): Payer: Self-pay

## 2019-03-17 ENCOUNTER — Encounter (HOSPITAL_COMMUNITY)
Admission: RE | Admit: 2019-03-17 | Discharge: 2019-03-17 | Disposition: A | Discharge status: Home / Self Care | Payer: BC Managed Care – PPO | Source: Ambulatory Visit | Attending: Nephrology | Admitting: Nephrology

## 2019-03-17 DIAGNOSIS — D509 Iron deficiency anemia, unspecified: Secondary | ICD-10-CM | POA: Diagnosis not present

## 2019-03-17 MED ORDER — SODIUM CHLORIDE 0.9 % IV SOLN
510.0000 mg | Freq: Once | INTRAVENOUS | Status: AC
  Administered 2019-03-17: 510 mg via INTRAVENOUS
  Filled 2019-03-17: qty 17

## 2019-03-17 MED ORDER — SODIUM CHLORIDE 0.9 % IV SOLN: Freq: Once | INTRAVENOUS | Status: AC

## 2019-03-24 ENCOUNTER — Encounter: Payer: Self-pay | Admitting: Pulmonary Disease

## 2019-03-24 ENCOUNTER — Other Ambulatory Visit: Payer: Self-pay

## 2019-03-24 ENCOUNTER — Ambulatory Visit: Payer: BC Managed Care – PPO | Admitting: Pulmonary Disease

## 2019-03-24 VITALS — BP 116/74 | HR 83 | Temp 98.1°F | Ht 66.0 in | Wt 329.0 lb

## 2019-03-24 DIAGNOSIS — G4733 Obstructive sleep apnea (adult) (pediatric): Secondary | ICD-10-CM

## 2019-03-24 NOTE — Progress Notes (Signed)
Subjective:    Patient ID: Janet [name], female    DOB: May 13, 1995, 24 y.o.   MRN: [id number]  Patient is being seen for obstructive sleep apnea  Patient was recently diagnosed with severe obstructive sleep apnea, had a recent home sleep study Admits to heavy snoring, witnessed apneas Morning headaches Night sweats  Usually goes to bed between 11 PM and 3 AM final wake up time between 9 and 10 AM Falls asleep almost immediately 45 awakenings  Has recently gained about 50 pounds  Mom does snore  Never smoker  Past Medical History:  Diagnosis Date  . Allergy   . Anemia   . Anxiety   . GERD (gastroesophageal reflux disease)   . Headache(784.0)   . 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  . Thyroid disease    Hashimotos    Social History   Socioeconomic History  . Marital status: Single    Spouse name: Not on file  . Number of children: Not on file  . Years of education: Not on file  . Highest education level: Not on file  Occupational History  . Occupation: Conservation officer, [employer]    Comment: Clinical biochemist  Tobacco Use  . Smoking status: Current Some Day Smoker    Packs/day: 0.50    Years: 2.00    Pack years: 1.00    Types: Cigarettes  . Smokeless tobacco: Never Used  Substance and Sexual Activity  . Alcohol use: Not Currently    Comment: rare  . Drug use: No  . Sexual activity: Never    Birth control/protection: None  Other Topics Concern  . Not on file  Social History Narrative  . Not on file   Social Determinants of Health   Financial Resource Strain:   . Difficulty of Paying Living Expenses:   Food Insecurity:   . Worried About Programme researcher, broadcasting/film/video in the Last Year:   . Barista in the Last Year:   Transportation Needs:   . Freight forwarder (Medical):   Marland Kitchen Lack of Transportation (Non-Medical):   Physical Activity:   . Days of Exercise per Week:   . Minutes of Exercise per Session:   Stress:   . Feeling of Stress :   Social  Connections:   . Frequency of Communication with Friends and Family:   . Frequency of Social Gatherings with Friends and Family:   . Attends Religious Services:   . Active Member of Clubs or Organizations:   . Attends Banker Meetings:   Marland Kitchen Marital Status:   Intimate Partner Violence:   . Fear of Current or Ex-Partner:   . Emotionally Abused:   Marland Kitchen Physically Abused:   . Sexually Abused:    Family History  Problem Relation Age of Onset  . Heart attack Maternal Grandmother   . Heart Problems Maternal Grandfather   . Heart attack Maternal Grandfather   . Cancer Paternal Grandmother   . Cancer Paternal Grandfather   . Diabetes Mother   . Other Mother        gastropresis  . Anxiety disorder Mother   . Other Father        syncope-has a pacemaker  . COPD Father    Review of Systems  Constitutional: Positive for unexpected weight change. Negative for fever.  HENT: Positive for trouble swallowing. Negative for congestion, dental problem, ear pain, nosebleeds, postnasal drip, rhinorrhea, sinus pressure, sneezing and sore throat.   Eyes: Negative for redness and  itching.  Respiratory: Positive for shortness of breath. Negative for cough, chest tightness and wheezing.   Cardiovascular: Negative for palpitations and leg swelling.  Gastrointestinal: Negative for nausea and vomiting.  Genitourinary: Negative for dysuria.  Musculoskeletal: Positive for joint swelling.  Skin: Negative for rash.  Allergic/Immunologic: Negative.  Negative for environmental allergies, food allergies and immunocompromised state.  Neurological: Positive for headaches.  Hematological: Does not bruise/bleed easily.  Psychiatric/Behavioral: Negative for dysphoric mood. The patient is nervous/anxious.       Objective:   Physical Exam Constitutional:      Appearance: She is obese.  HENT:     Head: Normocephalic.     Mouth/Throat:     Mouth: Mucous membranes are moist.     Comments: Crowded  oropharynx, Mallampati 4 Eyes:     Extraocular Movements: Extraocular movements intact.     Pupils: Pupils are equal, round, and reactive to light.  Cardiovascular:     Rate and Rhythm: Normal rate and regular rhythm.     Pulses: Normal pulses.     Heart sounds: Normal heart sounds. No murmur. No friction rub.  Pulmonary:     Effort: Pulmonary effort is normal. No respiratory distress.     Breath sounds: Normal breath sounds. No stridor. No wheezing or rhonchi.  Musculoskeletal:     Cervical back: Normal range of motion and neck supple. No rigidity or tenderness.  Neurological:     Mental Status: She is alert.    Vitals:   03/24/19 1424  BP: 116/74  Pulse: 83  Temp: 98.1 F (36.7 C)  SpO2: 96%   Results of the Epworth flowsheet 03/24/2019  Sitting and reading 2  Watching TV 3  Sitting, inactive in a public place (e.g. a theatre or a meeting) 0  As a passenger in a car for an hour without a break 3  Lying down to rest in the afternoon when circumstances permit 2  Sitting and talking to someone 0  Sitting quietly after a lunch without alcohol 1  In a car, while stopped for a few minutes in traffic 0  Total score 11   Sleep study reviewed showing AHI of 47   Assessment & Plan:  .  Severe obstructive sleep apnea .  Excessive daytime sleepiness .  Morbid obesity . Pathophysiology of sleep disordered breathing discussed with the patient Treatment options for sleep disordered breathing discussed with patient  Plan: We will initiate CPAP therapy Referral to DME for CPAP set up CPAP pressures auto CPAP 5-20 We will follow-up with patient in about 3 months with compliance data  Call to continue working on weight loss efforts  Encouraged to call with any significant concerns .

## 2019-03-24 NOTE — Patient Instructions (Signed)
Severe obstructive sleep apnea  We will contact the medical supply company to set you up for CPAP  CPAP settings Auto CPAP 5-20  I will see you back in about 2 to 3 months  Call with any significant concerns  Continue with aggressive weight loss measures

## 2019-04-05 DIAGNOSIS — G4733 Obstructive sleep apnea (adult) (pediatric): Secondary | ICD-10-CM | POA: Diagnosis not present

## 2019-04-07 ENCOUNTER — Encounter: Payer: Self-pay | Admitting: Adult Health

## 2019-04-07 ENCOUNTER — Ambulatory Visit (INDEPENDENT_AMBULATORY_CARE_PROVIDER_SITE_OTHER): Payer: BC Managed Care – PPO | Admitting: Adult Health

## 2019-04-07 ENCOUNTER — Other Ambulatory Visit: Payer: Self-pay

## 2019-04-07 VITALS — BP 117/79 | HR 76 | Ht 66.0 in | Wt 337.5 lb

## 2019-04-07 DIAGNOSIS — N920 Excessive and frequent menstruation with regular cycle: Secondary | ICD-10-CM | POA: Diagnosis not present

## 2019-04-07 DIAGNOSIS — N182 Chronic kidney disease, stage 2 (mild): Secondary | ICD-10-CM | POA: Insufficient documentation

## 2019-04-07 DIAGNOSIS — Z862 Personal history of diseases of the blood and blood-forming organs and certain disorders involving the immune mechanism: Secondary | ICD-10-CM

## 2019-04-07 DIAGNOSIS — N946 Dysmenorrhea, unspecified: Secondary | ICD-10-CM

## 2019-04-07 DIAGNOSIS — Z7689 Persons encountering health services in other specified circumstances: Secondary | ICD-10-CM | POA: Insufficient documentation

## 2019-04-07 LAB — POCT HEMOGLOBIN: Hemoglobin: 13.2 g/dL (ref 11–14.6)

## 2019-04-07 LAB — POCT URINE PREGNANCY: Preg Test, Ur: NEGATIVE

## 2019-04-07 MED ORDER — NORETHIN ACE-ETH ESTRAD-FE 1-20 MG-MCG PO TABS: 1.0000 | ORAL_TABLET | Freq: Every day | ORAL | 6 refills | Status: DC

## 2019-04-07 NOTE — Progress Notes (Signed)
Patient ID: STAVROULA ROHDE, female   DOB: 09-Oct-1995, 24 y.o.   MRN: [id number] History of Present Illness: Miche is a 24 year old white female,single,G0P0 in complaining of heavy periods, she was referred by Dr Wolfgang Phoenix, who treats her CKD, and she had thyroid removed and is on Tirosint, by [facility]. She is a smoker.  PCP is Dr Nadine Counts   Current Medications, Allergies, Past Medical History, Past Surgical History, Family History and Social History were reviewed in [employer] electronic medical record.     Review of Systems: Has been anemic Periods last 7 days and 4 are heavy, changes pads every 4 hours Had cramps Not currently having sex     Physical Exam:BP 117/79 (BP Location: Left Arm, Patient Position: Sitting, Cuff Size: Large)   Pulse 76   Ht 5\' 6"  (1.676 m)   Wt (!) 337 lb 8 oz (153.1 kg)   LMP 03/31/2019   BMI 54.47 kg/m  UPT is negative HGB  13.2 General:  Well developed, well nourished, no acute distress Skin:  Warm and dry Neck:  Midline trachea, thyroid is absent, good ROM, no lymphadenopathy Lungs; Clear to auscultation bilaterally Cardiovascular: Regular rate and rhythm Pelvic:  External genitalia is normal in appearance, no lesions.  The vagina is normal in appearance. Urethra has no lesions or masses. The cervix is smooth.  Uterus is felt to be normal size, shape, and contour.  No adnexal masses or tenderness noted.Bladder is non tender, no masses felt. Extremities/musculoskeletal:  No swelling or varicosities noted, no clubbing or cyanosis Psych:  No mood changes, alert and cooperative,seems happy Alcohol audit is 1 Fall risk is low PHQ 9 score is 13, no SI, is on lexapro  Examination chaperoned by Abby CMA  Impression and Plan: 1. History of anemia  2. Menorrhagia with regular cycle Will get GYN 04/02/2019 Decrease smoking   3. Stage 2 chronic kidney disease   4. Dysmenorrhea Will  get GYN [place] in 1 week  5. Encounter for menstrual  regulation Will Rx junel 1/20, can start today  Meds ordered this encounter  Medications  . norethindrone-ethinyl estradiol (LOESTRIN FE) 1-20 MG-MCG tablet    Sig: Take 1 tablet by mouth daily.    Dispense:  1 Package    Refill:  6    Order Specific Question:   Supervising Provider    Answer:   2/20 [[id number]]   Will see in follow up in 8 weeks

## 2019-04-13 ENCOUNTER — Ambulatory Visit (INDEPENDENT_AMBULATORY_CARE_PROVIDER_SITE_OTHER): Payer: BC Managed Care – PPO

## 2019-04-13 ENCOUNTER — Other Ambulatory Visit: Payer: Self-pay

## 2019-04-13 DIAGNOSIS — N920 Excessive and frequent menstruation with regular cycle: Secondary | ICD-10-CM | POA: Diagnosis not present

## 2019-04-13 NOTE — Progress Notes (Signed)
PELVIC US TA/TV: Homogeneous retroverted uterus,wnl,EEC 8.9 mm,normal ovaries,ovaries appear mobile,no pain during ultrasound,no free fluid

## 2019-04-19 DIAGNOSIS — R7309 Other abnormal glucose: Secondary | ICD-10-CM | POA: Diagnosis not present

## 2019-04-19 DIAGNOSIS — K909 Intestinal malabsorption, unspecified: Secondary | ICD-10-CM | POA: Diagnosis not present

## 2019-04-19 DIAGNOSIS — E039 Hypothyroidism, unspecified: Secondary | ICD-10-CM | POA: Diagnosis not present

## 2019-04-26 DIAGNOSIS — E890 Postprocedural hypothyroidism: Secondary | ICD-10-CM | POA: Diagnosis not present

## 2019-04-26 DIAGNOSIS — K909 Intestinal malabsorption, unspecified: Secondary | ICD-10-CM | POA: Diagnosis not present

## 2019-04-26 DIAGNOSIS — K900 Celiac disease: Secondary | ICD-10-CM | POA: Diagnosis not present

## 2019-04-26 DIAGNOSIS — E039 Hypothyroidism, unspecified: Secondary | ICD-10-CM | POA: Diagnosis not present

## 2019-05-06 DIAGNOSIS — G4733 Obstructive sleep apnea (adult) (pediatric): Secondary | ICD-10-CM | POA: Diagnosis not present

## 2019-06-05 DIAGNOSIS — G4733 Obstructive sleep apnea (adult) (pediatric): Secondary | ICD-10-CM | POA: Diagnosis not present

## 2019-06-08 ENCOUNTER — Encounter: Payer: Self-pay | Admitting: Adult Health

## 2019-06-08 ENCOUNTER — Ambulatory Visit: Payer: BC Managed Care – PPO | Admitting: Adult Health

## 2019-06-08 VITALS — BP 117/81 | HR 76 | Ht 66.0 in | Wt 327.4 lb

## 2019-06-08 DIAGNOSIS — Z7689 Persons encountering health services in other specified circumstances: Secondary | ICD-10-CM | POA: Diagnosis not present

## 2019-06-08 DIAGNOSIS — N182 Chronic kidney disease, stage 2 (mild): Secondary | ICD-10-CM

## 2019-06-08 MED ORDER — NORETHIN ACE-ETH ESTRAD-FE 1-20 MG-MCG PO TABS: 1.0000 | ORAL_TABLET | Freq: Every day | ORAL | 12 refills | Status: DC

## 2019-06-08 NOTE — Progress Notes (Signed)
  Subjective:     Patient ID: Janet [name], female   DOB: [birth year], 24 y.o.   MRN: [id number]  HPI Janet [name] is a 24 year old white female,single, G0P0, back in follow up on stating Junel 1/20 for heavy periods. She has decreased smoking due to work schedule.  PCP is Dr Nadine Counts.  Review of Systems Periods much better, short and light, no pain  Denies any headaches or BTB or vaginal discharge  Reviewed past medical,surgical, social and family history. Reviewed medications and allergies.      Objective:   Physical Exam BP 117/81 (BP Location: Left Arm, Patient Position: Sitting, Cuff Size: Normal)   Pulse 76   Ht 5\' 6"  (1.676 m)   Wt (!) 327 lb 6.4 oz (148.5 kg)   LMP 05/24/2019   BMI 52.84 kg/m  Skin warm and dry. Lungs: clear to ausculation bilaterally. Cardiovascular: regular rate and rhythm.    Assessment:     1. Encounter for menstrual regulation Continue Junel 1/20 Meds ordered this encounter  Medications  . norethindrone-ethinyl estradiol (LOESTRIN FE) 1-20 MG-MCG tablet    Sig: Take 1 tablet by mouth daily.    Dispense:  1 Package    Refill:  12    Order Specific Question:   Supervising Provider    Answer:   2/20, LUTHER H [[id number]]    2. Stage 2 chronic kidney disease Sees Dr Despina Hidden in June    Plan:     Pap and physical with PCP See me in 1 year or sooner if needed

## 2019-06-14 ENCOUNTER — Other Ambulatory Visit: Payer: Self-pay | Admitting: Family Medicine

## 2019-06-22 DIAGNOSIS — E039 Hypothyroidism, unspecified: Secondary | ICD-10-CM | POA: Diagnosis not present

## 2019-06-28 DIAGNOSIS — E890 Postprocedural hypothyroidism: Secondary | ICD-10-CM | POA: Diagnosis not present

## 2019-06-28 DIAGNOSIS — E039 Hypothyroidism, unspecified: Secondary | ICD-10-CM | POA: Diagnosis not present

## 2019-06-28 DIAGNOSIS — K909 Intestinal malabsorption, unspecified: Secondary | ICD-10-CM | POA: Diagnosis not present

## 2019-08-20 IMAGING — NM NM HEPATO W/GB/PHARM/[PERSON_NAME]
2 series · 12 of 12 positions shown · non-contrast
Comparison: Ultrasound 02/14/2015

CLINICAL DATA: Right upper quadrant pain with nausea and vomiting

EXAM:
NUCLEAR MEDICINE HEPATOBILIARY IMAGING WITH GALLBLADDER EF
TECHNIQUE: Sequential images of the abdomen were obtained [DATE] minutes
following intravenous administration of radiopharmaceutical. After
oral ingestion of Ensure, gallbladder ejection fraction was
determined. At 60 min, normal ejection fraction is greater than 33%.
RADIOPHARMACEUTICALS:  4.8 mCi Nc-33m  Choletec IV

[Series 1: biliary · 3.25mm/px · 6 of 60 frames shown]
[frame 6/60]
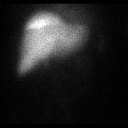
[frame 16/60]
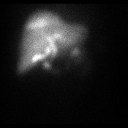
[frame 26/60]
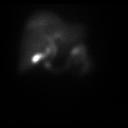
[frame 36/60]
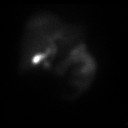
[frame 46/60]
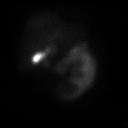
[frame 56/60]
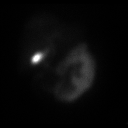

[Series 2: gbef · 3.25mm/px · 6 of 60 frames shown]
[frame 6/60]
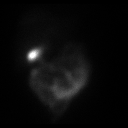
[frame 16/60]
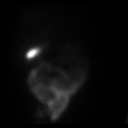
[frame 26/60]
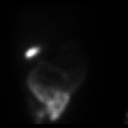
[frame 36/60]
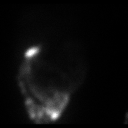
[frame 46/60]
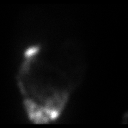
[frame 56/60]
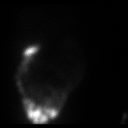

[12 of 12 positions shown; findings below may reference images not displayed]

FINDINGS: Prompt uptake and biliary excretion of activity by the liver is
seen. Gallbladder activity is visualized, consistent with patency of
cystic duct. Biliary activity passes into small bowel, consistent
with patent common bile duct.

Calculated gallbladder ejection fraction is 54%. (Normal gallbladder
ejection fraction with Ensure is greater than 33%.)
IMPRESSION: Negative examination

## 2019-11-15 ENCOUNTER — Telehealth: Payer: Self-pay | Admitting: Family Medicine

## 2019-11-15 NOTE — Telephone Encounter (Signed)
Patient needs appt

## 2019-11-22 ENCOUNTER — Encounter (INDEPENDENT_AMBULATORY_CARE_PROVIDER_SITE_OTHER): Payer: Self-pay | Admitting: Gastroenterology

## 2019-11-22 ENCOUNTER — Other Ambulatory Visit: Payer: Self-pay

## 2019-11-22 ENCOUNTER — Ambulatory Visit (INDEPENDENT_AMBULATORY_CARE_PROVIDER_SITE_OTHER): Payer: 59 | Admitting: Family Medicine

## 2019-11-22 VITALS — BP 124/86 | HR 71 | Temp 96.9°F | Ht 66.0 in | Wt 320.0 lb

## 2019-11-22 DIAGNOSIS — R5382 Chronic fatigue, unspecified: Secondary | ICD-10-CM

## 2019-11-22 DIAGNOSIS — Z23 Encounter for immunization: Secondary | ICD-10-CM | POA: Diagnosis not present

## 2019-11-22 DIAGNOSIS — K219 Gastro-esophageal reflux disease without esophagitis: Secondary | ICD-10-CM | POA: Diagnosis not present

## 2019-11-22 DIAGNOSIS — N182 Chronic kidney disease, stage 2 (mild): Secondary | ICD-10-CM

## 2019-11-22 DIAGNOSIS — Z9989 Dependence on other enabling machines and devices: Secondary | ICD-10-CM

## 2019-11-22 DIAGNOSIS — G4733 Obstructive sleep apnea (adult) (pediatric): Secondary | ICD-10-CM

## 2019-11-22 DIAGNOSIS — K921 Melena: Secondary | ICD-10-CM

## 2019-11-22 DIAGNOSIS — E063 Autoimmune thyroiditis: Secondary | ICD-10-CM

## 2019-11-22 DIAGNOSIS — E038 Other specified hypothyroidism: Secondary | ICD-10-CM

## 2019-11-22 LAB — BAYER DCA HB A1C WAIVED: HB A1C (BAYER DCA - WAIVED): 5.4 % (ref <7.0)

## 2019-11-22 MED ORDER — DEXILANT 30 MG PO CPDR: 30.0000 mg | DELAYED_RELEASE_CAPSULE | Freq: Every day | ORAL | 3 refills | Status: DC

## 2019-11-22 MED ORDER — ESCITALOPRAM OXALATE 20 MG PO TABS: 20.0000 mg | ORAL_TABLET | Freq: Every day | ORAL | 3 refills | Status: DC

## 2019-11-22 NOTE — Progress Notes (Signed)
Subjective: CC: Follow-up CKD, hypothyroidism, morbid obesity PCP: Raliegh Ip, DO F[facility] Janet [name] is a 24 y.o. female presenting to clinic today for:  1.  CKD 2 Patient is followed by Dr. Wolfgang Phoenix in [place] for CKD 2.  She had iron transfusions done recently.  She has follow-up with him in the next week or so.  She will be getting labs collected for him today.  She continues to struggle with quite a bit of swelling in the arms and legs.  No reports of chest pain or shortness of breath.  2.  Hashimoto's hypothyroidism/ obesity Patient reports compliance with her thyroid replacement.  She actually had to switch over to something else recently due to her insurance changing and the cost of the prescribed medicine being several $100.  She has ongoing the swelling of the arms and legs, difficulty with weight loss and fatigue.  She is managed by Dr. Ronnette Hila  3.  GERD Patient with ongoing GERD symptoms despite use of Dexilant daily.  She is had some black stools.  She is on iron supplementation for the above.  She reports early satiety and very little food intake secondary to nausea.  She has not seen her gastroenterologist since her EGD with esophageal stretch.   ROS: Per HPI  No Known Allergies Past Medical History:  Diagnosis Date  . Allergy   . Anemia   . Anxiety   . Chronic kidney disease    stage 2 she says   . GERD (gastroesophageal reflux disease)   . Headache(784.0)   . 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  . Thyroid disease    Hashimotos     Current Outpatient Medications:  .  acetaminophen (MIDOL) 650 MG CR tablet, Take 650 mg by mouth every 8 (eight) hours as needed (for menstrual pain/cramps)., Disp: , Rfl:  .  Dexlansoprazole (DEXILANT) 30 MG capsule, Take 1 capsule (30 mg total) by mouth daily., Disp: 30 capsule, Rfl: 12 .  ergocalciferol (VITAMIN D2) 1.25 MG (50000 UT) capsule, Take 1 capsule by mouth once a week., Disp: , Rfl:  .  escitalopram  (LEXAPRO) 20 MG tablet, Take 1 tablet (20 mg total) by mouth daily., Disp: 90 tablet, Rfl: 1 .  ferrous sulfate 324 (65 Fe) MG TBEC, Take 1 tablet (324 mg total) by mouth daily., Disp: 90 tablet, Rfl: 3 .  furosemide (LASIX) 20 MG tablet, TAKE 1/2 TABLET BY MOUTH DAILY, Disp: 45 tablet, Rfl: 1 .  ibuprofen (ADVIL,MOTRIN) 200 MG tablet, Take 600-800 mg by mouth every 8 (eight) hours as needed (for pain/headache)., Disp: , Rfl:  .  norethindrone-ethinyl estradiol (LOESTRIN FE) 1-20 MG-MCG tablet, Take 1 tablet by mouth daily., Disp: 1 Package, Rfl: 12 .  TIROSINT-SOL 125 MCG/ML SOLN, TAKE 2 ML BY MOUTH IN THE MORNING BEFORE BREAKFAST ONCE A DAY, Disp: , Rfl:  Social History   Socioeconomic History  . Marital status: Single    Spouse name: Not on file  . Number of children: Not on file  . Years of education: Not on file  . Highest education level: Not on file  Occupational History  . Occupation: Conservation officer, [employer]    Comment: Clinical biochemist  Tobacco Use  . Smoking status: Current Some Day Smoker    Packs/day: 0.50    Years: 2.00    Pack years: 1.00    Types: Cigarettes  . Smokeless tobacco: Never Used  Vaping Use  . Vaping Use: Never used  Substance and Sexual Activity  .  Alcohol use: Not Currently    Comment: rare  . Drug use: No  . Sexual activity: Not Currently    Birth control/protection: None, Pill  Other Topics Concern  . Not on file  Social History Narrative  . Not on file   Social Determinants of Health   Financial Resource Strain: Medium Risk  . Difficulty of Paying Living Expenses: Somewhat hard  Food Insecurity: No Food Insecurity  . Worried About Programme researcher, broadcasting/film/video in the Last Year: Never true  . Ran Out of Food in the Last Year: Never true  Transportation Needs: No Transportation Needs  . Lack of Transportation (Medical): No  . Lack of Transportation (Non-Medical): No  Physical Activity: Insufficiently Active  . Days of Exercise per Week: 4 days  . Minutes of  Exercise per Session: 30 min  Stress: Stress Concern Present  . Feeling of Stress : Rather much  Social Connections: Moderately Isolated  . Frequency of Communication with Friends and Family: More than three times a week  . Frequency of Social Gatherings with Friends and Family: Once a week  . Attends Religious Services: 1 to 4 times per year  . Active Member of Clubs or Organizations: No  . Attends Banker Meetings: Never  . Marital Status: Never married  Intimate Partner Violence: Not At Risk  . Fear of Current or Ex-Partner: No  . Emotionally Abused: No  . Physically Abused: No  . Sexually Abused: No   Family History  Problem Relation Age of Onset  . Heart attack Maternal Grandmother   . Heart Problems Maternal Grandfather   . Heart attack Maternal Grandfather   . Cancer Paternal Grandmother   . Cancer Paternal Grandfather   . Diabetes Mother   . Other Mother        gastropresis  . Anxiety disorder Mother   . Other Father        syncope-has a pacemaker  . COPD Father     Objective: Office vital signs reviewed. BP 124/86   Pulse 71   Temp (!) 96.9 F (36.1 C)   Ht 5\' 6"  (1.676 m)   Wt (!) 320 lb (145.2 kg)   LMP 11/15/2019 (Exact Date)   SpO2 94%   BMI 51.65 kg/m   Physical Examination:  General: Awake, alert, obese.  Well nourished, No acute distress HEENT: Normal; sclera white.  No exophthalmos.  Thyroid full Cardio: regular rate and rhythm, S1S2 heard, no murmurs appreciated Pulm: clear to auscultation bilaterally, no wheezes, rhonchi or rales; normal work of breathing on room air GI: Mild epigastric tenderness palpation.  No distention. Extremities: warm, well perfused, nonpitting edema noted along bilateral hands and feet. Skin: dry; intact; no rashes or lesions Neuro: No tremor  Assessment/ Plan: 24 y.o. female   1. CKD (chronic kidney disease) stage 2, GFR 60-89 ml/min She needed a new referral back to Dr. 22 given change in  insurance.  This has been placed.  Check ferritin level given history of recent iron transfusions and known CKD.  Renal function testing and CBC were obtained by nephrology - Ferritin - Ambulatory referral to Nephrology  2. Gastroesophageal reflux disease, unspecified whether esophagitis present Referral to her gastroenterologist in [place] for reevaluation.  I worry about early satiety.  May be due to uncontrolled GERD but also may be secondary to a new onset gastroparesis.  Additionally, with reports of black stool, cannot rule out GI bleed but again may be related to iron supplementation -  Dexlansoprazole (DEXILANT) 30 MG capsule; Take 1 capsule (30 mg total) by mouth daily.  Dispense: 90 capsule; Refill: 3 - Ambulatory referral to Gastroenterology  3. Morbid obesity (HCC) Likely compounded by hypothyroidism - Bayer DCA Hb A1c Waived  4. Black stool - Ambulatory referral to Gastroenterology  5. Hypothyroidism due to Hashimoto's thyroiditis Check lipid panel.  Referral back to her endocrinologist who will manage her thyroid levels - Lipid Panel - Ambulatory referral to Endocrinology  6. Chronic fatigue Again possibly secondary to endocrine dysregulation as above but will check ferritin and B12 - Ferritin - Vitamin B12  7. OSA on CPAP Continue CPAP  8. Need for immunization against influenza Administered - Flu Vaccine QUAD 36+ mos IM   No orders of the defined types were placed in this encounter.  No orders of the defined types were placed in this encounter.    Raliegh Ip, DO [address] [facility] [phone]

## 2019-11-23 LAB — LIPID PANEL
Chol/HDL Ratio: 8.3 ratio — ABNORMAL HIGH (ref 0.0–4.4)
Cholesterol, Total: 258 mg/dL — ABNORMAL HIGH (ref 100–199)
HDL: 31 mg/dL — ABNORMAL LOW (ref >39)
LDL Chol Calc (NIH): 186 mg/dL — ABNORMAL HIGH (ref 0–99)
Triglycerides: 215 mg/dL — ABNORMAL HIGH (ref 0–149)
VLDL Cholesterol Cal: 41 mg/dL — ABNORMAL HIGH (ref 5–40)

## 2019-11-23 LAB — VITAMIN B12: Vitamin B-12: 205 pg/mL — ABNORMAL LOW (ref 232–1245)

## 2019-11-23 LAB — FERRITIN: Ferritin: 49 ng/mL (ref 15–150)

## 2019-11-24 ENCOUNTER — Telehealth: Payer: Self-pay | Admitting: Family Medicine

## 2019-11-29 ENCOUNTER — Other Ambulatory Visit: Payer: Self-pay

## 2019-11-29 ENCOUNTER — Telehealth: Payer: Self-pay

## 2019-11-29 ENCOUNTER — Ambulatory Visit (INDEPENDENT_AMBULATORY_CARE_PROVIDER_SITE_OTHER): Payer: 59 | Admitting: *Deleted

## 2019-11-29 DIAGNOSIS — M7989 Other specified soft tissue disorders: Secondary | ICD-10-CM

## 2019-11-29 DIAGNOSIS — E538 Deficiency of other specified B group vitamins: Secondary | ICD-10-CM | POA: Diagnosis not present

## 2019-11-29 DIAGNOSIS — K219 Gastro-esophageal reflux disease without esophagitis: Secondary | ICD-10-CM

## 2019-11-29 MED ORDER — DEXILANT 30 MG PO CPDR: 30.0000 mg | DELAYED_RELEASE_CAPSULE | Freq: Every day | ORAL | 3 refills | Status: DC

## 2019-11-29 MED ORDER — FUROSEMIDE 20 MG PO TABS: 10.0000 mg | ORAL_TABLET | Freq: Every day | ORAL | 3 refills | Status: DC

## 2019-11-29 MED ORDER — FERROUS SULFATE 324 (65 FE) MG PO TBEC
324.0000 mg | DELAYED_RELEASE_TABLET | Freq: Every day | ORAL | 3 refills | Status: DC
Start: 2019-11-29 — End: 2023-04-23

## 2019-11-29 MED ORDER — ESCITALOPRAM OXALATE 20 MG PO TABS
20.0000 mg | ORAL_TABLET | Freq: Every day | ORAL | 3 refills | Status: DC
Start: 2019-11-29 — End: 2019-11-29

## 2019-11-29 MED ORDER — ESCITALOPRAM OXALATE 20 MG PO TABS
20.0000 mg | ORAL_TABLET | Freq: Every day | ORAL | 3 refills | Status: DC
Start: 2019-11-29 — End: 2021-02-28

## 2019-11-29 MED ORDER — CYANOCOBALAMIN 1000 MCG/ML IJ SOLN
1000.0000 ug | Freq: Once | INTRAMUSCULAR | Status: AC
  Administered 2019-11-29: 1000 ug via INTRAMUSCULAR

## 2019-11-29 MED ORDER — FERROUS SULFATE 324 (65 FE) MG PO TBEC
324.0000 mg | DELAYED_RELEASE_TABLET | Freq: Every day | ORAL | 3 refills | Status: DC
Start: 2019-11-29 — End: 2019-11-29

## 2019-11-29 NOTE — Telephone Encounter (Signed)
Aware refills sent to [facility]

## 2019-11-29 NOTE — Progress Notes (Signed)
Pt given B12 injection IM left deltoid and tolerated well. °

## 2019-12-14 ENCOUNTER — Telehealth: Payer: Self-pay

## 2019-12-14 NOTE — Telephone Encounter (Signed)
Mom aware per dpr.  ?

## 2019-12-14 NOTE — Telephone Encounter (Signed)
These do not exist as a prescription orally.  I want her to get OTC sublingual B12 (usually comes in 1000mg ) and take daily.

## 2019-12-14 NOTE — Telephone Encounter (Signed)
Please advise on Vit B12 oralls dose to be sent to [facility]

## 2019-12-20 ENCOUNTER — Other Ambulatory Visit: Payer: Self-pay

## 2019-12-20 ENCOUNTER — Ambulatory Visit (INDEPENDENT_AMBULATORY_CARE_PROVIDER_SITE_OTHER): Payer: 59 | Admitting: Family Medicine

## 2019-12-20 VITALS — BP 102/68 | HR 79 | Temp 97.9°F | Ht 66.0 in | Wt 326.0 lb

## 2019-12-20 DIAGNOSIS — R601 Generalized edema: Secondary | ICD-10-CM

## 2019-12-20 DIAGNOSIS — G4733 Obstructive sleep apnea (adult) (pediatric): Secondary | ICD-10-CM

## 2019-12-20 DIAGNOSIS — E538 Deficiency of other specified B group vitamins: Secondary | ICD-10-CM

## 2019-12-20 DIAGNOSIS — N182 Chronic kidney disease, stage 2 (mild): Secondary | ICD-10-CM | POA: Diagnosis not present

## 2019-12-20 DIAGNOSIS — K219 Gastro-esophageal reflux disease without esophagitis: Secondary | ICD-10-CM

## 2019-12-20 DIAGNOSIS — K449 Diaphragmatic hernia without obstruction or gangrene: Secondary | ICD-10-CM

## 2019-12-20 DIAGNOSIS — E063 Autoimmune thyroiditis: Secondary | ICD-10-CM

## 2019-12-20 DIAGNOSIS — Z9989 Dependence on other enabling machines and devices: Secondary | ICD-10-CM

## 2019-12-20 DIAGNOSIS — M7989 Other specified soft tissue disorders: Secondary | ICD-10-CM

## 2019-12-20 MED ORDER — OMEPRAZOLE 20 MG PO CPDR: 20.0000 mg | DELAYED_RELEASE_CAPSULE | Freq: Every day | ORAL | 3 refills | Status: DC

## 2019-12-20 MED ORDER — FUROSEMIDE 20 MG PO TABS: 40.0000 mg | ORAL_TABLET | ORAL | 3 refills | Status: DC

## 2019-12-20 NOTE — Progress Notes (Signed)
Subjective: CC: Follow-up swelling, hypothyroidism PCP: Raliegh Ip, DO Janet [name] is a 24 y.o. female presenting to clinic today for:  1.  Hypothyroidism/swelling Patient reports compliance with her thyroid replacement.  She unfortunately was unable to secure a follow-up visit with her endocrinologist until after the new year.  She has ongoing swelling but does admit that she is not compliant with her CPAP machine.  Once it was wrapped around her neck when she woke up and that made her scared to use it.  She is talked to her nephrologist and he has her taking Lasix 40 mg every other day.  She does have a follow-up scheduled with him at some point, urine output is good with this dose.  2.  GERD Patient reports that GERD has been uncontrolled despite use of Pepcid OTC.  She was unable to get the Dexilant due to cost and is asking for an alternative.  Does not report any blood in stool but does report abdominal pain that is intermittent and sharp.  She has a known hiatal hernia and will be following up with her gastroenterologist in February.  3.  B12 deficiency Patient is compliant with B12 orally and her iron as well.  Bowel movements are normal.  Her energy has improved some since being on the B12.   ROS: Per HPI  No Known Allergies Past Medical History:  Diagnosis Date  . Allergy   . Anemia   . Anxiety   . Chronic kidney disease    stage 2 she says   . GERD (gastroesophageal reflux disease)   . Headache(784.0)   . 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  . Thyroid disease    Hashimotos     Current Outpatient Medications:  .  acetaminophen (MIDOL) 650 MG CR tablet, Take 650 mg by mouth every 8 (eight) hours as needed (for menstrual pain/cramps)., Disp: , Rfl:  .  ergocalciferol (VITAMIN D2) 1.25 MG (50000 UT) capsule, Take 1 capsule by mouth once a week., Disp: , Rfl:  .  escitalopram (LEXAPRO) 20 MG tablet, Take 1 tablet (20 mg total) by mouth daily.,  Disp: 90 tablet, Rfl: 3 .  ferrous sulfate 324 (65 Fe) MG TBEC, Take 1 tablet (324 mg total) by mouth daily., Disp: 90 tablet, Rfl: 3 .  furosemide (LASIX) 20 MG tablet, Take 0.5 tablets (10 mg total) by mouth daily. (Patient taking differently: Take 20 mg by mouth daily.), Disp: 45 tablet, Rfl: 3 .  norethindrone-ethinyl estradiol (LOESTRIN FE) 1-20 MG-MCG tablet, Take 1 tablet by mouth daily., Disp: 1 Package, Rfl: 12 .  TIROSINT-SOL 125 MCG/ML SOLN, TAKE 2 ML BY MOUTH IN THE MORNING BEFORE BREAKFAST ONCE A DAY, Disp: , Rfl:  Social History   Socioeconomic History  . Marital status: Single    Spouse name: Not on file  . Number of children: Not on file  . Years of education: Not on file  . Highest education level: Not on file  Occupational History  . Occupation: Conservation officer, [employer]    Comment: Clinical biochemist  Tobacco Use  . Smoking status: Current Some Day Smoker    Packs/day: 0.50    Years: 2.00    Pack years: 1.00    Types: Cigarettes  . Smokeless tobacco: Never Used  Vaping Use  . Vaping Use: Never used  Substance and Sexual Activity  . Alcohol use: Not Currently    Comment: rare  . Drug use: No  . Sexual activity: Not Currently  Birth control/protection: None, Pill  Other Topics Concern  . Not on file  Social History Narrative  . Not on file   Social Determinants of Health   Financial Resource Strain: Medium Risk  . Difficulty of Paying Living Expenses: Somewhat hard  Food Insecurity: No Food Insecurity  . Worried About Programme researcher, broadcasting/film/video in the Last Year: Never true  . Ran Out of Food in the Last Year: Never true  Transportation Needs: No Transportation Needs  . Lack of Transportation (Medical): No  . Lack of Transportation (Non-Medical): No  Physical Activity: Insufficiently Active  . Days of Exercise per Week: 4 days  . Minutes of Exercise per Session: 30 min  Stress: Stress Concern Present  . Feeling of Stress : Rather much  Social Connections: Moderately Isolated   . Frequency of Communication with Friends and Family: More than three times a week  . Frequency of Social Gatherings with Friends and Family: Once a week  . Attends Religious Services: 1 to 4 times per year  . Active Member of Clubs or Organizations: No  . Attends Banker Meetings: Never  . Marital Status: Never married  Intimate Partner Violence: Not At Risk  . Fear of Current or Ex-Partner: No  . Emotionally Abused: No  . Physically Abused: No  . Sexually Abused: No   Family History  Problem Relation Age of Onset  . Heart attack Maternal Grandmother   . Heart Problems Maternal Grandfather   . Heart attack Maternal Grandfather   . Cancer Paternal Grandmother   . Cancer Paternal Grandfather   . Diabetes Mother   . Other Mother        gastropresis  . Anxiety disorder Mother   . Other Father        syncope-has a pacemaker  . COPD Father     Objective: Office vital signs reviewed. BP 102/68   Pulse 79   Temp 97.9 F (36.6 C) (Temporal)   Ht 5\' 6"  (1.676 m)   Wt (!) 326 lb (147.9 kg)   SpO2 97%   BMI 52.62 kg/m   Physical Examination:  General: Awake, alert, obese, No acute distress HEENT: Normal, sclera white, MMM Cardio: regular rate and rhythm, S1S2 heard, no murmurs appreciated Pulm: clear to auscultation bilaterally, no wheezes, rhonchi or rales; normal work of breathing on room air GI: obese, soft, generalized TTP. No guarding or rebound. BS present Extremities: nonpitting edema noted throughout hands and feet.  Assessment/ Plan: 24 y.o. female   Anasarca - Plan: Renal Function Panel, CBC with Differential  Vitamin B12 deficiency - Plan: Vitamin B12  CKD (chronic kidney disease) stage 2, GFR 60-89 ml/min - Plan: Renal Function Panel, CBC with Differential  OSA on CPAP  Hashimoto's thyroiditis - Plan: Thyroid Panel With TSH  Other specified soft tissue disorders - Plan: furosemide (LASIX) 20 MG tablet  Gastroesophageal reflux disease  without esophagitis - Plan: omeprazole (PRILOSEC) 20 MG capsule  Hiatal hernia - Plan: omeprazole (PRILOSEC) 20 MG capsule  Ongoing swelling. Noncompliant with CPAP, expressed the importance of this and its role in helping with swelling, energy, weight, etc.  Check Vit B12. May need to continue monthly injections if inadequate response to Orals.  Check thyroid panel.  Will cc to Endo.  Lasix instructions adjusted to reflect Renal's recommendations  GERD not controlled with H2 blocker so PPI rx'd.  Follow up with gastro as scheduled in February for recheck of abdominal pain/ hiatal hernia.   No orders  of the defined types were placed in this encounter.  No orders of the defined types were placed in this encounter.   Raliegh Ip, DO [facility] [phone]

## 2019-12-21 LAB — RENAL FUNCTION PANEL
Albumin: 4.3 g/dL (ref 3.9–5.0)
BUN/Creatinine Ratio: 14 (ref 9–23)
BUN: 15 mg/dL (ref 6–20)
CO2: 20 mmol/L (ref 20–29)
Calcium: 8.8 mg/dL (ref 8.7–10.2)
Chloride: 101 mmol/L (ref 96–106)
Creatinine, Ser: 1.05 mg/dL — ABNORMAL HIGH (ref 0.57–1.00)
GFR calc Af Amer: 85 mL/min/{1.73_m2} (ref >59)
GFR calc non Af Amer: 74 mL/min/{1.73_m2} (ref >59)
Glucose: 98 mg/dL (ref 65–99)
Phosphorus: 3.5 mg/dL (ref 3.0–4.3)
Potassium: 3.6 mmol/L (ref 3.5–5.2)
Sodium: 139 mmol/L (ref 134–144)

## 2019-12-21 LAB — CBC WITH DIFFERENTIAL/PLATELET
Basophils Absolute: 0.1 10*3/uL (ref 0.0–0.2)
Basos: 1 %
EOS (ABSOLUTE): 0.1 10*3/uL (ref 0.0–0.4)
Eos: 1 %
Hematocrit: 36.2 % (ref 34.0–46.6)
Hemoglobin: 12.7 g/dL (ref 11.1–15.9)
Immature Grans (Abs): 0 10*3/uL (ref 0.0–0.1)
Immature Granulocytes: 0 %
Lymphocytes Absolute: 2.8 10*3/uL (ref 0.7–3.1)
Lymphs: 28 %
MCH: 35.1 pg — ABNORMAL HIGH (ref 26.6–33.0)
MCHC: 35.1 g/dL (ref 31.5–35.7)
MCV: 100 fL — ABNORMAL HIGH (ref 79–97)
Monocytes Absolute: 0.4 10*3/uL (ref 0.1–0.9)
Monocytes: 4 %
Neutrophils Absolute: 6.7 10*3/uL (ref 1.4–7.0)
Neutrophils: 66 %
Platelets: 274 10*3/uL (ref 150–450)
RBC: 3.62 x10E6/uL — ABNORMAL LOW (ref 3.77–5.28)
RDW: 12.4 % (ref 11.7–15.4)
WBC: 10 10*3/uL (ref 3.4–10.8)

## 2019-12-21 LAB — THYROID PANEL WITH TSH
Free Thyroxine Index: 0.1 — ABNORMAL LOW (ref 1.2–4.9)
T3 Uptake Ratio: 15 % — ABNORMAL LOW (ref 24–39)
T4, Total: 0.4 ug/dL — CL (ref 4.5–12.0)
TSH: 301 u[IU]/mL — ABNORMAL HIGH (ref 0.450–4.500)

## 2019-12-21 LAB — VITAMIN B12: Vitamin B-12: 486 pg/mL (ref 232–1245)

## 2019-12-22 ENCOUNTER — Telehealth: Payer: Self-pay

## 2019-12-22 NOTE — Telephone Encounter (Signed)
Pts mom returned missed call from [facility] regarding lab results. Reviewed results with mom per Dr Jeannett Senior note. Mom voiced understanding.

## 2020-02-14 ENCOUNTER — Encounter (INDEPENDENT_AMBULATORY_CARE_PROVIDER_SITE_OTHER): Payer: Self-pay | Admitting: Gastroenterology

## 2020-02-14 ENCOUNTER — Encounter (INDEPENDENT_AMBULATORY_CARE_PROVIDER_SITE_OTHER): Payer: Self-pay

## 2020-02-14 ENCOUNTER — Other Ambulatory Visit (INDEPENDENT_AMBULATORY_CARE_PROVIDER_SITE_OTHER): Payer: Self-pay

## 2020-02-14 ENCOUNTER — Ambulatory Visit (INDEPENDENT_AMBULATORY_CARE_PROVIDER_SITE_OTHER): Payer: Self-pay | Admitting: Gastroenterology

## 2020-02-14 ENCOUNTER — Other Ambulatory Visit: Payer: Self-pay

## 2020-02-14 DIAGNOSIS — R1011 Right upper quadrant pain: Secondary | ICD-10-CM

## 2020-02-14 DIAGNOSIS — G8929 Other chronic pain: Secondary | ICD-10-CM | POA: Insufficient documentation

## 2020-02-14 DIAGNOSIS — R112 Nausea with vomiting, unspecified: Secondary | ICD-10-CM | POA: Insufficient documentation

## 2020-02-14 MED ORDER — ONDANSETRON HCL 4 MG PO TABS: 4.0000 mg | ORAL_TABLET | Freq: Three times a day (TID) | ORAL | 1 refills | Status: DC | PRN

## 2020-02-14 MED ORDER — DICYCLOMINE HCL 10 MG PO CAPS: 10.0000 mg | ORAL_CAPSULE | Freq: Two times a day (BID) | ORAL | 5 refills | Status: DC

## 2020-02-14 NOTE — Progress Notes (Signed)
Katrinka Blazing, M.D. Gastroenterology & Hepatology [facility] [address] [place], [place] [address]  Primary Care Physician: Raliegh Ip, DO [address] [place] [address]  I will communicate my assessment and recommendations to the referring MD via EMR.  Problems: 1. Right upper quadrant abdominal pain 2. Elevated liver enzymes, likely related to fatty liver  History of Present Illness: ADAISHA CAMPISE is a 25 y.o. female with PMH thyroid disease s/p thyroidectomy, GERD, IDA, who presents for follow up of right upper quadrant abdominal pain.  The patient was last seen on 09/18/2017. At that time, the patient underwent a HIDA scan for evaluation of abdominal pain elevated liver enzymes, which showed normal gallbladder ejection..  She underwent a repeat right upper quadrant ultrasound on 12/15/2018 which showed findings concerning for hepatic acidosis.  Patient reports having intermittent episodes of brief episodes of RUQ abdominal pain for the last 5 years. These episodes occur every couple of months, last for 15 minutes and are severe in intensity. The episodes are self limited. She also presents diaphoresis and nausea but no vomiting when the pain occurs. The patient denies any fever or chills, hematochezia, melena, hematemesis, abdominal distention, choluria, diarrhea, jaundice, pruritus or weight loss.  Patient only feels that in between the episodes she has some nausea in the morning. Very occasionally she has vomited.  Patient has heartburn which is completely controlled with Prilosec 20 mg qday.  Previous workup of her elevated liver enzymes includes neg acute hep panel and ANA in 12/08/2018.  Most recent blood work-up from 12/15/2018 showed elevated AST 59, ALT 33 with rest of hepatic panel within normal limits.  Last EGD: patient believes she had one a couple of years ago but no report is available Last Colonoscopy:  never  FHx: gastroparesis mother, neg for any gastrointestinal/liver disease, grandmother and grandfather lung cancer Social: smoking 1/2 pack a day, neg alcohol or illicit drug use Surgical: no abdominal surgeries  Past Medical History: Past Medical History:  Diagnosis Date  . Allergy   . Anemia   . Anxiety   . Chronic kidney disease    stage 2 she says   . GERD (gastroesophageal reflux disease)   . Headache(784.0)   . 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  . Thyroid disease    Hashimotos     Past Surgical History: Past Surgical History:  Procedure Laterality Date  . THYROIDECTOMY N/A 07/01/2017   Procedure: TOTAL THYROIDECTOMY;  Surgeon: Darnell Level, MD;  Location: [facility]S;  Service: General;  Laterality: N/A;  . TONSILLECTOMY AND ADENOIDECTOMY Bilateral 01/2011  . TOTAL THYROIDECTOMY     Dr. Gerrit Friends 07-01-17    Family History: Family History  Problem Relation Age of Onset  . Heart attack Maternal Grandmother   . Heart Problems Maternal Grandfather   . Heart attack Maternal Grandfather   . Cancer Paternal Grandmother   . Cancer Paternal Grandfather   . Diabetes Mother   . Other Mother        gastropresis  . Anxiety disorder Mother   . Other Father        syncope-has a pacemaker  . COPD Father     Social History: Social History   Tobacco Use  Smoking Status Current Some Day Smoker  . Packs/day: 0.50  . Years: 2.00  . Pack years: 1.00  . Types: Cigarettes  Smokeless Tobacco Never Used   Social History   Substance and Sexual Activity  Alcohol Use Not Currently   Comment: rare   Social History   Substance and Sexual Activity  Drug Use No    Allergies: No Known Allergies  Medications: Current Outpatient Medications  Medication Sig Dispense Refill  . acetaminophen (TYLENOL) 650 MG CR tablet Take 650 mg by mouth every 8 (eight) hours as needed (for menstrual pain/cramps).    . ergocalciferol (VITAMIN D2) 1.25 MG (50000 UT) capsule Take 1  capsule by mouth once a week.    . escitalopram (LEXAPRO) 20 MG tablet Take 1 tablet (20 mg total) by mouth daily. 90 tablet 3  . ferrous sulfate 324 (65 Fe) MG TBEC Take 1 tablet (324 mg total) by mouth daily. 90 tablet 3  . furosemide (LASIX) 20 MG tablet Take 2 tablets (40 mg total) by mouth every other day. 90 tablet 3  . ibuprofen (ADVIL) 200 MG tablet Take 200 mg by mouth every 6 (six) hours as needed.    . norethindrone-ethinyl estradiol (LOESTRIN FE) 1-20 MG-MCG tablet Take 1 tablet by mouth daily. 1 Package 12  . omeprazole (PRILOSEC) 20 MG capsule Take 1 capsule (20 mg total) by mouth daily. 30 capsule 3  . TIROSINT-SOL 125 MCG/ML SOLN TAKE 2 ML BY MOUTH IN THE MORNING BEFORE BREAKFAST ONCE A DAY     No current facility-administered medications for this visit.    Review of Systems: GENERAL: negative for malaise, night sweats HEENT: No changes in hearing or vision, no nose bleeds or other nasal problems. NECK: Negative for lumps, goiter, pain and significant neck swelling RESPIRATORY: Negative for cough, wheezing CARDIOVASCULAR: Negative for chest pain, leg swelling, palpitations, orthopnea GI: SEE HPI MUSCULOSKELETAL: Negative for joint pain or swelling, back pain, and muscle pain. SKIN: Negative for lesions, rash PSYCH: Negative for sleep disturbance, mood disorder and recent psychosocial stressors. HEMATOLOGY Negative for prolonged bleeding, bruising easily, and swollen nodes. ENDOCRINE: Negative for cold or heat intolerance, polyuria, polydipsia and goiter. NEURO: negative for tremor, gait imbalance, syncope and seizures. The remainder of the review of systems is noncontributory.   Physical Exam: BP 113/77 (BP Location: Left Arm, Patient Position: Sitting, Cuff Size: Large)   Pulse 80   Temp 97.6 F (36.4 C) (Oral)   Ht 5\' 6"  (1.676 m)   Wt (!) 336 lb (152.4 kg)   BMI 54.23 kg/m  GENERAL: The patient is AO x3, in no acute distress.  Obese. HEENT: Head is  normocephalic and atraumatic. EOMI are intact. Mouth is well hydrated and without lesions. NECK: Supple. No masses LUNGS: Clear to auscultation. No presence of rhonchi/wheezing/rales. Adequate chest expansion HEART: RRR, normal s1 and s2. ABDOMEN: Mild discomfort upon palpation of the right flank, right upper quadrant and epigastric area, but no guarding, no peritoneal signs, and nondistended. BS +. No masses. EXTREMITIES: Without any cyanosis, clubbing, rash, lesions or edema. NEUROLOGIC: AOx3, no focal motor deficit. SKIN: no jaundice, no rashes  Imaging/Labs: as above  I personally reviewed and interpreted the available labs, imaging and endoscopic files.  Impression and Plan: IEISHA GAO is a 25 y.o. female with PMH thyroid disease s/p thyroidectomy, GERD, IDA, who presents for follow up of right upper quadrant abdominal pain.  The patient has had recurrent episodes of right upper quadrant abdominal pain that are self-limited but severe in nature.  It is possible that the symptoms are related to a biliary colic, although no gallstones have been observed in her previous ultrasound.  It is also possible that her symptoms are functional in  nature.  I would like her to be evaluated by general surgery cetrimide if her symptoms are related to a gallbladder pathology that requires cholecystectomy.  We will also explore other organic causes with an EGD.  Ultimately her symptoms are functional, she may benefit from antispasmodic medication such as Bentyl as needed will be prescribed today.  Also sent a prescription for Zofran as needed for episodes of nausea.  Finally, we will check celiac serology to evaluate her symptomatology.  Given her obesity, her alterations in the aminotransferase level are likely related to NASH, will discuss lifestyle management of this condition in her next appointment and consider ruling her causes such as autoimmune or metabolic pathologies.  -Schedule EGD  -Check TTG  IgA -Referral to general surgery -Can take Bentyl as needed for abdominal pain -Can take Zofran as needed for sever nausea/vomiting  All questions were answered.      Dolores Frame, MD Gastroenterology and Hepatology [facility]

## 2020-02-14 NOTE — Patient Instructions (Addendum)
Schedule EGD  Perform blood workup Referral to general surgery Can take Bentyl as needed for abdominal pain Can take Zofran as needed for sever nausea/vomiting

## 2020-02-15 ENCOUNTER — Encounter: Payer: Self-pay | Admitting: Family Medicine

## 2020-02-15 ENCOUNTER — Ambulatory Visit (INDEPENDENT_AMBULATORY_CARE_PROVIDER_SITE_OTHER): Payer: Self-pay | Admitting: Family Medicine

## 2020-02-15 ENCOUNTER — Telehealth (INDEPENDENT_AMBULATORY_CARE_PROVIDER_SITE_OTHER): Payer: Self-pay | Admitting: Gastroenterology

## 2020-02-15 VITALS — BP 124/86 | HR 67 | Temp 97.3°F | Ht 66.0 in | Wt 334.6 lb

## 2020-02-15 DIAGNOSIS — R0600 Dyspnea, unspecified: Secondary | ICD-10-CM

## 2020-02-15 DIAGNOSIS — N182 Chronic kidney disease, stage 2 (mild): Secondary | ICD-10-CM

## 2020-02-15 DIAGNOSIS — E063 Autoimmune thyroiditis: Secondary | ICD-10-CM

## 2020-02-15 DIAGNOSIS — R0601 Orthopnea: Secondary | ICD-10-CM

## 2020-02-15 DIAGNOSIS — R601 Generalized edema: Secondary | ICD-10-CM

## 2020-02-15 DIAGNOSIS — R0609 Other forms of dyspnea: Secondary | ICD-10-CM

## 2020-02-15 LAB — TISSUE TRANSGLUTAMINASE, IGA: (tTG) Ab, IgA: <1 U/mL

## 2020-02-15 LAB — IGA: Immunoglobulin A: 225 mg/dL (ref 47–310)

## 2020-02-15 NOTE — Patient Instructions (Signed)
Spoke with Dr Donette Larry in the ED at [facility].  He will be on the lookout for you.

## 2020-02-15 NOTE — Telephone Encounter (Signed)
Noted Egd has been canceled at this time

## 2020-02-15 NOTE — Progress Notes (Signed)
Noted ok thank you!

## 2020-02-15 NOTE — Telephone Encounter (Signed)
I spoke to the patient today regarding the need to cancel her EGD as her TSH has been extremely elevated with very low T4 levels, with most recent TSH of 300 (checked on December 2021).  I explained to her that it is more important for her at this moment to have her hypothyroidism controlled by her endocrinologist before proceeding with a nonurgent EGD.  The patient understood and agreed.  She will need to follow-up in clinic in 8-12 weeks and will determine if her TSH is better controlled before scheduling her for this procedure.  Katrinka Blazing, MD Gastroenterology and Hepatology [facility]

## 2020-02-15 NOTE — Progress Notes (Signed)
Subjective: CC: Fatigue PCP: Raliegh Ip, DO AYT:KZSWF V Peri is a 25 y.o. female presenting to clinic today for:  1. Fatigue Patient presents for worsening fatigue over the last several weeks.  She notes compliance with vitamin B12.  Was noted to have low vitamin B12 couple of months ago but subsequent check was low end of normal.  She has had worsening swelling throughout the entire body, reports dyspnea on exertion and reports orthopnea.  Both the dyspnea on exertion and orthopnea are new over the last couple weeks.  She is unable to lie flat and often tries to support her body with multiple pillows.  She has been taking Lasix 40 mg every other day and previously she did have some urine output from this but she does report decreased urine output as of late.  Her thyroid remains uncontrolled despite 250 micrograms of levothyroxine via liquid daily.  Last TSH was obtained in December and TSH was well over 300 with T4 levels at 0.4.  Her endocrinologist really wanted her to have the injection but apparently there is difficulty obtaining this through her insurance and it is quite expensive.  Her renal function has improved and is been stable at CKD 2.  She has been compliant with her CPAP for OSA  There are plans for EGD given ongoing right upper quadrant pain and vomiting with nausea.  There is concern that gallbladder may be the etiology.   ROS: Per HPI  No Known Allergies Past Medical History:  Diagnosis Date  . Allergy   . Anemia   . Anxiety   . Chronic kidney disease    stage 2 she says   . GERD (gastroesophageal reflux disease)   . Headache(784.0)   . 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  . Thyroid disease    Hashimotos     Current Outpatient Medications:  .  acetaminophen (TYLENOL) 650 MG CR tablet, Take 650 mg by mouth every 8 (eight) hours as needed (for menstrual pain/cramps)., Disp: , Rfl:  .  dicyclomine (BENTYL) 10 MG capsule, Take 1 capsule (10 mg  total) by mouth 2 (two) times daily before a meal., Disp: 60 capsule, Rfl: 5 .  ergocalciferol (VITAMIN D2) 1.25 MG (50000 UT) capsule, Take 1 capsule by mouth once a week., Disp: , Rfl:  .  escitalopram (LEXAPRO) 20 MG tablet, Take 1 tablet (20 mg total) by mouth daily., Disp: 90 tablet, Rfl: 3 .  ferrous sulfate 324 (65 Fe) MG TBEC, Take 1 tablet (324 mg total) by mouth daily., Disp: 90 tablet, Rfl: 3 .  furosemide (LASIX) 20 MG tablet, Take 2 tablets (40 mg total) by mouth every other day., Disp: 90 tablet, Rfl: 3 .  ibuprofen (ADVIL) 200 MG tablet, Take 200 mg by mouth every 6 (six) hours as needed., Disp: , Rfl:  .  norethindrone-ethinyl estradiol (LOESTRIN FE) 1-20 MG-MCG tablet, Take 1 tablet by mouth daily., Disp: 1 Package, Rfl: 12 .  omeprazole (PRILOSEC) 20 MG capsule, Take 1 capsule (20 mg total) by mouth daily., Disp: 30 capsule, Rfl: 3 .  ondansetron (ZOFRAN) 4 MG tablet, Take 1 tablet (4 mg total) by mouth every 8 (eight) hours as needed for nausea or vomiting., Disp: 30 tablet, Rfl: 1 .  TIROSINT-SOL 125 MCG/ML SOLN, TAKE 2 ML BY MOUTH IN THE MORNING BEFORE BREAKFAST ONCE A DAY, Disp: , Rfl:  Social History   Socioeconomic History  . Marital status: Single    Spouse name: Not  on file  . Number of children: Not on file  . Years of education: Not on file  . Highest education level: Not on file  Occupational History  . Occupation: Conservation officer, [employer]    Comment: Clinical biochemist  Tobacco Use  . Smoking status: Current Some Day Smoker    Packs/day: 0.50    Years: 2.00    Pack years: 1.00    Types: Cigarettes  . Smokeless tobacco: Never Used  Vaping Use  . Vaping Use: Never used  Substance and Sexual Activity  . Alcohol use: Not Currently    Comment: rare  . Drug use: No  . Sexual activity: Not Currently    Birth control/protection: None, Pill  Other Topics Concern  . Not on file  Social History Narrative  . Not on file   Social Determinants of Health   Financial Resource  Strain: Medium Risk  . Difficulty of Paying Living Expenses: Somewhat hard  Food Insecurity: No Food Insecurity  . Worried About Programme researcher, broadcasting/film/video in the Last Year: Never true  . Ran Out of Food in the Last Year: Never true  Transportation Needs: No Transportation Needs  . Lack of Transportation (Medical): No  . Lack of Transportation (Non-Medical): No  Physical Activity: Insufficiently Active  . Days of Exercise per Week: 4 days  . Minutes of Exercise per Session: 30 min  Stress: Stress Concern Present  . Feeling of Stress : Rather much  Social Connections: Moderately Isolated  . Frequency of Communication with Friends and Family: More than three times a week  . Frequency of Social Gatherings with Friends and Family: Once a week  . Attends Religious Services: 1 to 4 times per year  . Active Member of Clubs or Organizations: No  . Attends Banker Meetings: Never  . Marital Status: Never married  Intimate Partner Violence: Not At Risk  . Fear of Current or Ex-Partner: No  . Emotionally Abused: No  . Physically Abused: No  . Sexually Abused: No   Family History  Problem Relation Age of Onset  . Heart attack Maternal Grandmother   . Heart Problems Maternal Grandfather   . Heart attack Maternal Grandfather   . Cancer Paternal Grandmother   . Cancer Paternal Grandfather   . Diabetes Mother   . Other Mother        gastropresis  . Anxiety disorder Mother   . Other Father        syncope-has a pacemaker  . COPD Father     Objective: Office vital signs reviewed. BP 124/86   Pulse 67   Temp (!) 97.3 F (36.3 C)   Ht 5\' 6"  (1.676 m)   Wt (!) 334 lb 9.6 oz (151.8 kg)   LMP 02/08/2020   SpO2 99%   BMI 54.01 kg/m   Physical Examination:  General: Awake, drowsy appearing.  She is quite swollen on exam, much more so than check 2 months ago, No acute distress HEENT: Normal; no JVD.  Sclera are anicteric Cardio: regular rate and rhythm, S1S2 heard, no murmurs  appreciated Pulm: Distant breath sounds but appear to be clear to auscultation bilaterally, no wheezes, rhonchi or rales; normal work of breathing on room air GI: Abdominal exam is limited by obesity Extremities: warm, well perfused, Non pitting edema noted throughout her body including face, upper extremities and lower extremities, no cyanosis or clubbing MSK: Ambulating independently Neuro: No tremor  Assessment/ Plan: 25 y.o. female   Anasarca  Orthopnea  Dyspnea on exertion  Hashimoto's thyroiditis  CKD (chronic kidney disease) stage 2, GFR 60-89 ml/min  At this point I am quite perplexed.  I have no idea why she is having so much swelling.  She has had an 8 pound weight gain since our last visit in December.  She commented that this was really over the last couple of weeks that this weight has increased.  She is essentially refractory to oral diuretic therapies.  Her edema is nonpitting.  I am quite concerned that symptoms may ultimately be related to uncontrolled hypothyroidism (last TSH in 12/21 >300, T4 = 0.4) and may be impacting her liver at this point.  She did have a mild elevation of her AST back in 2020.  I have spoken to Dr. Donette Larry with [facility] medical emergency department and explained my concerns.  She will likely need admission for IV diuresis and further evaluation of her symptoms.  EEG was expected on 02/24/2020 but I wonder if perhaps they can obtain this in the emergency department if warranted at that time.  The last imaging of her liver that I could find was the ultrasound which was obtained on 12/27/2018 and showed hepatic steatosis at that time  No orders of the defined types were placed in this encounter.  No orders of the defined types were placed in this encounter.    Raliegh Ip, DO [facility] [phone]

## 2020-02-16 ENCOUNTER — Telehealth: Payer: Self-pay

## 2020-02-18 ENCOUNTER — Encounter (INDEPENDENT_AMBULATORY_CARE_PROVIDER_SITE_OTHER): Payer: Self-pay

## 2020-02-24 ENCOUNTER — Other Ambulatory Visit (HOSPITAL_COMMUNITY): Payer: Medicaid Other

## 2020-02-25 ENCOUNTER — Ambulatory Visit (HOSPITAL_COMMUNITY): Admit: 2020-02-25 | Payer: Medicaid Other | Admitting: Gastroenterology

## 2020-02-25 ENCOUNTER — Encounter (HOSPITAL_COMMUNITY): Payer: Self-pay

## 2020-02-25 SURGERY — ESOPHAGOGASTRODUODENOSCOPY (EGD) WITH PROPOFOL
Anesthesia: Monitor Anesthesia Care

## 2020-02-28 ENCOUNTER — Other Ambulatory Visit: Payer: Self-pay | Admitting: Family Medicine

## 2020-02-28 DIAGNOSIS — K439 Ventral hernia without obstruction or gangrene: Secondary | ICD-10-CM

## 2020-03-09 ENCOUNTER — Telehealth: Payer: Self-pay

## 2020-03-09 NOTE — Telephone Encounter (Signed)
Absolutely.  Please provide note for restroom breaks prn.

## 2020-03-09 NOTE — Telephone Encounter (Signed)
Pt aware letter available at front desk and can also be viewed and printed from MyChart

## 2020-03-09 NOTE — Telephone Encounter (Signed)
Pt needs a note for work to be able to go to restroom when needed. She is going more often because she is on a fluid pill. Please call back when ready to pickup

## 2020-03-23 ENCOUNTER — Ambulatory Visit: Payer: Medicaid Other | Admitting: General Surgery

## 2020-04-04 ENCOUNTER — Ambulatory Visit (INDEPENDENT_AMBULATORY_CARE_PROVIDER_SITE_OTHER): Payer: Self-pay | Admitting: General Surgery

## 2020-04-04 ENCOUNTER — Encounter: Payer: Self-pay | Admitting: General Surgery

## 2020-04-04 ENCOUNTER — Other Ambulatory Visit: Payer: Self-pay

## 2020-04-04 VITALS — BP 110/74 | HR 86 | Temp 97.4°F | Resp 18 | Ht 67.0 in | Wt 313.0 lb

## 2020-04-04 DIAGNOSIS — G8929 Other chronic pain: Secondary | ICD-10-CM

## 2020-04-04 DIAGNOSIS — R1011 Right upper quadrant pain: Secondary | ICD-10-CM

## 2020-04-04 NOTE — Patient Instructions (Addendum)
Will get an [place] to see if something has changed with the gallbladder since the last imaging. Will get lab work from Dr. Gayla Medicus. The labs at [facility] from 02/2020 still had a TSH > 200.

## 2020-04-04 NOTE — Progress Notes (Signed)
[facility] History and Physical  Reason for Referral: Chronic epigastric/ RUQ pain  Referring Physician:  Dr. Earmon Phoenix   Chief Complaint    New Patient (Initial Visit)      Janet [name] is a 25 y.o. female.  HPI:  Janet [name] is a 25 yo who has had chronic epigastric/ RUQ pain for sometime now and says that her last major episode was back in December but she has not had a major issues for the last three months. She has pain that is random and is not associated particular foods and can come from a salad or bread. She has been seen by GI and placed on a PPI and this helped some but she continues to have some attacks at times. She was scheduled for an EGD and this was canceled due to her TSH being > 300.  She has since been seen by her endocrinologist, Dr. Gayla Medicus and medications have been adjusted. She reports her levels are now back to normal.  Looking at her chart she has had [place] and HIDA in the past both of which were negative for any stones or dysfunction. She has some associated nausea and vomiting. She says the pain is stabbing and tearing in nature. She has had an EGD in the past and had her esophagus stretched.   Past Medical History:  Diagnosis Date  . Allergy   . Anemia   . Anxiety   . Chronic kidney disease    stage 2 she says   . GERD (gastroesophageal reflux disease)   . Headache(784.0)   . 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  . Thyroid disease    Hashimotos     Past Surgical History:  Procedure Laterality Date  . THYROIDECTOMY N/A 07/01/2017   Procedure: TOTAL THYROIDECTOMY;  Surgeon: Darnell Level, MD;  Location: [facility];  Service: General;  Laterality: N/A;  . TONSILLECTOMY AND ADENOIDECTOMY Bilateral 01/2011  . TOTAL THYROIDECTOMY     Dr. Gerrit Friends 07-01-17    Family History  Problem Relation Age of Onset  . Heart attack Maternal Grandmother   . Heart Problems Maternal Grandfather   . Heart attack Maternal Grandfather   . Cancer  Paternal Grandmother   . Cancer Paternal Grandfather   . Diabetes Mother   . Other Mother        gastropresis  . Anxiety disorder Mother   . Other Father        syncope-has a pacemaker  . COPD Father     Social History   Tobacco Use  . Smoking status: Current Some Day Smoker    Packs/day: 0.50    Years: 2.00    Pack years: 1.00    Types: Cigarettes  . Smokeless tobacco: Never Used  Vaping Use  . Vaping Use: Never used  Substance Use Topics  . Alcohol use: Not Currently    Comment: rare  . Drug use: No    Medications: I have reviewed the patient's current medications. Allergies as of 04/04/2020   No Known Allergies     Medication List       Accurate as of April 04, 2020 10:16 AM. If you have any questions, ask your nurse or doctor.        acetaminophen 650 MG CR tablet Commonly known as: TYLENOL Take 650 mg by mouth every 8 (eight) hours as needed (for menstrual pain/cramps).   dicyclomine 10 MG capsule Commonly known as: Bentyl Take 1 capsule (10 mg total) by  mouth 2 (two) times daily before a meal.   ergocalciferol 1.25 MG (50000 UT) capsule Commonly known as: VITAMIN D2 Take 1 capsule by mouth once a week.   escitalopram 20 MG tablet Commonly known as: LEXAPRO Take 1 tablet (20 mg total) by mouth daily.   ferrous sulfate 324 (65 Fe) MG Tbec Take 1 tablet (324 mg total) by mouth daily.   furosemide 20 MG tablet Commonly known as: LASIX Take 2 tablets (40 mg total) by mouth every other day.   ibuprofen 200 MG tablet Commonly known as: ADVIL Take 200 mg by mouth every 6 (six) hours as needed.   norethindrone-ethinyl estradiol 1-20 MG-MCG tablet Commonly known as: LOESTRIN FE Take 1 tablet by mouth daily.   omeprazole 20 MG capsule Commonly known as: PRILOSEC Take 1 capsule (20 mg total) by mouth daily.   ondansetron 4 MG tablet Commonly known as: ZOFRAN Take 1 tablet (4 mg total) by mouth every 8 (eight) hours as needed for nausea or  vomiting.   Tirosint-SOL 125 MCG/ML Soln Generic drug: Levothyroxine Sodium TAKE 2 ML BY MOUTH IN THE MORNING BEFORE BREAKFAST ONCE A DAY        ROS:  A comprehensive review of systems was negative except for: Gastrointestinal: positive for abdominal pain, nausea, reflux symptoms and vomiting Musculoskeletal: positive for back pain Endocrine: positive for tired sluggish, hot/cold intolerance  Blood pressure 110/74, pulse 86, temperature (!) 97.4 F (36.3 C), temperature source Other (Comment), resp. rate 18, height 5\' 7"  (1.702 m), weight (!) 313 lb (142 kg), SpO2 95 %. Physical Exam Vitals reviewed.  Constitutional:      Appearance: She is obese.  HENT:     Head: Normocephalic.     Nose: Nose normal.  Eyes:     Extraocular Movements: Extraocular movements intact.  Cardiovascular:     Rate and Rhythm: Normal rate.  Pulmonary:     Effort: Pulmonary effort is normal.  Abdominal:     General: There is no distension.     Palpations: Abdomen is soft.     Tenderness: There is abdominal tenderness in the right upper quadrant and epigastric area.  Musculoskeletal:     Left lower leg: Edema present.  Skin:    General: Skin is warm.  Neurological:     General: No focal deficit present.     Mental Status: She is oriented to person, place, and time.  Psychiatric:        Mood and Affect: Mood normal.        Behavior: Behavior normal.        Thought Content: Thought content normal.        Judgment: Judgment normal.     Results: HIDA 2019 CLINICAL DATA:  Right upper quadrant pain with nausea and vomiting  EXAM: NUCLEAR MEDICINE HEPATOBILIARY IMAGING WITH GALLBLADDER EF  TECHNIQUE: Sequential images of the abdomen were obtained out to 60 minutes following intravenous administration of radiopharmaceutical. After oral ingestion of Ensure, gallbladder ejection fraction was determined. At 60 min, normal ejection fraction is greater than 33%.  RADIOPHARMACEUTICALS:  4.8  mCi Tc-71m  Choletec IV  COMPARISON:  Ultrasound 02/14/2015  FINDINGS: Prompt uptake and biliary excretion of activity by the liver is seen. Gallbladder activity is visualized, consistent with patency of cystic duct. Biliary activity passes into small bowel, consistent with patent common bile duct.  Calculated gallbladder ejection fraction is 54%. (Normal gallbladder ejection fraction with Ensure is greater than 33%.)  IMPRESSION: Negative examination   Electronically  Signed   By: Jasmine Pang M.D.   On: 09/25/2017 14:14  RUQ [place] 2020 CLINICAL DATA:  Increased LFTs, right upper quadrant pain for 1.5 years  EXAM: ULTRASOUND ABDOMEN LIMITED RIGHT UPPER QUADRANT  COMPARISON:  Abdominal ultrasound dated 02/14/2015  FINDINGS: Gallbladder:  No gallstones or wall thickening visualized. No sonographic Murphy sign noted by sonographer.  Common bile duct:  Diameter: 4 mm  Liver:  No focal lesion identified. Liver echogenicity is increased, consistent with hepatic steatosis. Portal vein is patent on color Doppler imaging with normal direction of blood flow towards the liver.  Other: None.  IMPRESSION: Hepatic steatosis.   Electronically Signed   By: Romona Curls M.D.   On: 12/15/2018 14:45  Assessment & Plan:  DILIA ALEMANY is a 25 y.o. female with chronic epigastric/ RUQ pain. Her EGD was canceled due to her TSH being elevated to > 300 on preoperative labs. She had blood work with her endocrinologist and her last TSH 03/2020 0.346 (0.45-4.5) and free T4 2.13 (0.82-1.77).    -Will obtain a repeat US to see if there is changes or stones and discuss options further when she returns -She will keep a food diary to see if anything triggers her attacks  -Discussed that she still may want to get the EGD rescheduled given her chronic issues   Future Appointments  Date Time Provider Department Center  05/04/2020  2:00 PM Lucretia Roers, MD [facility]  None    All questions were answered to the satisfaction of the patient and family.   Lucretia Roers 04/04/2020, 10:16 AM

## 2020-04-06 ENCOUNTER — Other Ambulatory Visit (INDEPENDENT_AMBULATORY_CARE_PROVIDER_SITE_OTHER): Payer: Self-pay | Admitting: Gastroenterology

## 2020-04-06 ENCOUNTER — Telehealth (INDEPENDENT_AMBULATORY_CARE_PROVIDER_SITE_OTHER): Payer: Self-pay

## 2020-04-06 ENCOUNTER — Encounter (INDEPENDENT_AMBULATORY_CARE_PROVIDER_SITE_OTHER): Payer: Self-pay

## 2020-04-06 NOTE — Telephone Encounter (Signed)
I called Dr. Carmon Sails Averneni's [phone] office and left a vm asked that they send the most recent tsh that the patient states she had drawn.

## 2020-04-06 NOTE — Progress Notes (Signed)
Hi Janet [name],   I received the result of her TSH which was 0.34 in 03/20/2020.  Can you please schedule an EGD? Dx: abdominal pain. Room: 3  Thanks,  Katrinka Blazing, MD Gastroenterology and Hepatology [facility]

## 2020-04-06 NOTE — Telephone Encounter (Signed)
Patient returned the call and states the blood was ordered by Dr. Marlene Lard.

## 2020-04-06 NOTE — Telephone Encounter (Signed)
I called and spoke with the patient mother she will have the patient call me back with the name of the physician she had her labs drawn by.

## 2020-04-06 NOTE — Telephone Encounter (Signed)
We will need to request the results of the TSH (the most recent one isolated was from February 2022 which was in the 200s). Janet [name], can you please request the results from her endocrinologist office?   Thanks,   Katrinka Blazing, MD  Gastroenterology and Hepatology  [facility]

## 2020-04-11 ENCOUNTER — Ambulatory Visit (HOSPITAL_COMMUNITY)
Admission: RE | Admit: 2020-04-11 | Discharge: 2020-04-11 | Disposition: A | Discharge status: Home / Self Care | Payer: 59 | Source: Ambulatory Visit | Attending: General Surgery | Admitting: General Surgery

## 2020-04-11 DIAGNOSIS — R1011 Right upper quadrant pain: Secondary | ICD-10-CM | POA: Diagnosis present

## 2020-04-11 DIAGNOSIS — G8929 Other chronic pain: Secondary | ICD-10-CM | POA: Insufficient documentation

## 2020-04-11 NOTE — Telephone Encounter (Signed)
Labs received and Dr.has reviewed they have been scanned into epic.

## 2020-04-11 NOTE — Progress Notes (Signed)
Results received and the Physician has signed off on it and it has been scanned to the chart.

## 2020-04-12 ENCOUNTER — Other Ambulatory Visit (HOSPITAL_COMMUNITY): Payer: Medicaid Other

## 2020-04-13 NOTE — Progress Notes (Signed)
thanks

## 2020-04-13 NOTE — Progress Notes (Signed)
Waiting for her to call me with her work schedule

## 2020-04-17 ENCOUNTER — Other Ambulatory Visit (INDEPENDENT_AMBULATORY_CARE_PROVIDER_SITE_OTHER): Payer: Self-pay

## 2020-04-17 ENCOUNTER — Encounter (INDEPENDENT_AMBULATORY_CARE_PROVIDER_SITE_OTHER): Payer: Self-pay

## 2020-04-17 NOTE — Progress Notes (Signed)
Dr Levon Hedger Due to [facility] schedule she can only do a Tuesday, I have put her on for Tuesday 05/09/20

## 2020-05-02 ENCOUNTER — Other Ambulatory Visit: Payer: Self-pay

## 2020-05-02 ENCOUNTER — Ambulatory Visit (INDEPENDENT_AMBULATORY_CARE_PROVIDER_SITE_OTHER): Payer: 59 | Admitting: General Surgery

## 2020-05-02 ENCOUNTER — Encounter: Payer: Self-pay | Admitting: General Surgery

## 2020-05-02 VITALS — BP 107/76 | HR 70 | Temp 98.2°F | Resp 16 | Ht 67.0 in | Wt 302.0 lb

## 2020-05-02 DIAGNOSIS — K8020 Calculus of gallbladder without cholecystitis without obstruction: Secondary | ICD-10-CM | POA: Diagnosis not present

## 2020-05-02 NOTE — Progress Notes (Signed)
[facility] History and Physical   Chief Complaint    Follow-up      Janet [name] is a 25 y.o. female.  HPI:  Janet [name] is a 25 yo with some chronic epigastric pain and RUQ pain who had prior imaging without stones but has a [place] that shows stones now. She is continuing to have pain and had an episode this past weekend. She is scheduled for EGD next week given her chronic issues.  She had previously been scheduled and had the EGD canceled due to her TSH being elevated. She is now back under control with her medication and is seeing DR. Avernei.  She says that her pain is in the RUQ and she had nausea and vomiting.   Past Medical History:  Diagnosis Date  . Allergy   . Anemia   . Anxiety   . Chronic kidney disease    stage 2 she says   . GERD (gastroesophageal reflux disease)   . Headache(784.0)   . 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  . Thyroid disease    Hashimotos     Past Surgical History:  Procedure Laterality Date  . THYROIDECTOMY N/A 07/01/2017   Procedure: TOTAL THYROIDECTOMY;  Surgeon: Darnell Level, MD;  Location: [facility];  Service: General;  Laterality: N/A;  . TONSILLECTOMY AND ADENOIDECTOMY Bilateral 01/2011  . TOTAL THYROIDECTOMY     Dr. Gerrit Friends 07-01-17    Family History  Problem Relation Age of Onset  . Heart attack Maternal Grandmother   . Heart Problems Maternal Grandfather   . Heart attack Maternal Grandfather   . Cancer Paternal Grandmother   . Cancer Paternal Grandfather   . Diabetes Mother   . Other Mother        gastropresis  . Anxiety disorder Mother   . Other Father        syncope-has a pacemaker  . COPD Father     Social History   Tobacco Use  . Smoking status: Current Some Day Smoker    Packs/day: 0.50    Years: 2.00    Pack years: 1.00    Types: Cigarettes  . Smokeless tobacco: Never Used  Vaping Use  . Vaping Use: Never used  Substance Use Topics  . Alcohol use: Not Currently    Comment: rare  .  Drug use: No    Medications: I have reviewed the patient's current medications. Allergies as of 05/02/2020   No Known Allergies     Medication List       Accurate as of May 02, 2020 10:52 AM. If you have any questions, ask your nurse or doctor.        acetaminophen 650 MG CR tablet Commonly known as: TYLENOL Take 650 mg by mouth every 8 (eight) hours as needed (for menstrual pain/cramps).   dicyclomine 10 MG capsule Commonly known as: Bentyl Take 1 capsule (10 mg total) by mouth 2 (two) times daily before a meal.   ergocalciferol 1.25 MG (50000 UT) capsule Commonly known as: VITAMIN D2 Take 50,000 Units by mouth once a week.   escitalopram 20 MG tablet Commonly known as: LEXAPRO Take 1 tablet (20 mg total) by mouth daily. What changed: when to take this   ferrous sulfate 324 (65 Fe) MG Tbec Take 1 tablet (324 mg total) by mouth daily. What changed: when to take this   furosemide 20 MG tablet Commonly known as: LASIX Take 2 tablets (40 mg total) by mouth every other  day. What changed:   how much to take  when to take this   ibuprofen 200 MG tablet Commonly known as: ADVIL Take 400-600 mg by mouth every 6 (six) hours as needed for headache.   liothyronine 5 MCG tablet Commonly known as: CYTOMEL Take 5 mcg by mouth daily.   norethindrone-ethinyl estradiol 1-20 MG-MCG tablet Commonly known as: LOESTRIN FE Take 1 tablet by mouth daily.   omeprazole 20 MG capsule Commonly known as: PRILOSEC Take 1 capsule (20 mg total) by mouth daily. What changed: when to take this   ondansetron 4 MG tablet Commonly known as: ZOFRAN Take 1 tablet (4 mg total) by mouth every 8 (eight) hours as needed for nausea or vomiting.   Tirosint 150 MCG Caps Generic drug: Levothyroxine Sodium Take 300 mg by mouth every morning.   vitamin B-12 1000 MCG tablet Commonly known as: CYANOCOBALAMIN Take 1,000 mcg by mouth daily.        ROS:  A comprehensive review of systems  was negative except for: Gastrointestinal: positive for abdominal pain, nausea and vomiting  Blood pressure 107/76, pulse 70, temperature 98.2 F (36.8 C), temperature source Oral, resp. rate 16, height 5\' 7"  (1.702 m), weight (!) 302 lb (137 kg), SpO2 96 %. Physical Exam Vitals reviewed.  Constitutional:      Appearance: She is obese.  HENT:     Head: Normocephalic.     Nose: Nose normal.     Mouth/Throat:     Mouth: Mucous membranes are moist.  Eyes:     Extraocular Movements: Extraocular movements intact.  Neck:     Comments: Thyroid incision noted Cardiovascular:     Rate and Rhythm: Normal rate and regular rhythm.  Pulmonary:     Effort: Pulmonary effort is normal.     Breath sounds: Normal breath sounds.  Abdominal:     General: There is no distension.     Palpations: Abdomen is soft.     Tenderness: There is abdominal tenderness in the right upper quadrant and epigastric area.  Musculoskeletal:        General: Normal range of motion.  Skin:    General: Skin is warm.  Neurological:     General: No focal deficit present.     Mental Status: She is alert and oriented to person, place, and time.  Psychiatric:        Mood and Affect: Mood normal.        Behavior: Behavior normal.        Thought Content: Thought content normal.        Judgment: Judgment normal.    Results: CLINICAL DATA:  Right upper quadrant pain  EXAM: ULTRASOUND ABDOMEN LIMITED RIGHT UPPER QUADRANT  COMPARISON:  12/15/2018  FINDINGS: Gallbladder:  Interval development of multiple gallstones and sludge. No gallbladder wall thickening or pericholecystic fluid. Sonographic Murphy sign negative per technologist.  Common bile duct:  Diameter: 6 mm  Liver:  No focal lesion.  Diffusely increased parenchymal echogenicity.  Portal vein is patent on color Doppler imaging with normal direction of blood flow towards the liver.  Other: None.  IMPRESSION: 1. Cholelithiasis  without additional sonographic evidence of acute cholecystitis. 2. Diffuse increased echogenicity of the hepatic parenchyma is a nonspecific indicator of hepatocellular dysfunction, most commonly steatosis.   Electronically Signed   By: 14/08/2018 M.D.   On: 04/11/2020 09:44   Assessment & Plan:  [name] is a 25 y.o. female with gallstones on 22 now after repeat  imaging. She is getting EGD next week and I think she should keep this scheduled given everything. Will plan for lap chole in the upcoming weeks. She is seeing her endocrinologist and her lab work has all improved with changing her meds.   -PLAN: I counseled the patient about the indication, risks and benefits of laparoscopic cholecystectomy.  She understands there is a very small chance for bleeding, infection, injury to normal structures (including common bile duct), conversion to open surgery, persistent symptoms, evolution of postcholecystectomy diarrhea, need for secondary interventions, anesthesia reaction, cardiopulmonary issues and other risks not specifically detailed here. I described the expected recovery, the plan for follow-up and the restrictions during the recovery phase.  All questions were answered.   Discussed preop COVID testing.   All questions were answered to the satisfaction of the patient and family.    Majour Frei C Cruzito Standre 05/02/2020, 10:52 AM  

## 2020-05-02 NOTE — Patient Instructions (Signed)
Cholelithiasis  Cholelithiasis happens when gallstones form in the gallbladder. The gallbladder stores bile. Bile is a fluid that helps digest fats. Bile can harden and form into gallstones. If they cause a blockage, they can cause pain (gallbladder attack). What are the causes? This condition may be caused by:  Some blood diseases, such as sickle cell anemia.  Too much of a fat-like substance (cholesterol) in your bile.  Not enough bile salts in your bile. These salts help the body absorb and digest fats.  The gallbladder not emptying fully or often enough. This is common in pregnant women. What increases the risk? The following factors may make you more likely to develop this condition:  Being female.  Being pregnant many times.  Eating a lot of fried foods, fat, and refined carbs (refined carbohydrates).  Being very overweight (obese).  Being older than age 40.  Using medicines with female hormones in them for a long time.  Losing weight fast.  Having gallstones in your family.  Having some health problems, such as diabetes, Crohn's disease, or liver disease. What are the signs or symptoms? Often, there may be gallstones but no symptoms. These gallstones are called silent gallstones. If a gallstone causes a blockage, you may get sudden pain. The pain:  Can be in the upper right part of your belly (abdomen).  Normally comes at night or after you eat.  Can last an hour or more.  Can spread to your right shoulder, back, or chest.  Can feel like discomfort, burning, or fullness in the upper part of your belly (indigestion). If the blockage lasts more than a few hours, you can get an infection or swelling. You may:  Feel like you may vomit.  Vomit.  Feel bloated.  Have belly pain for 5 hours or more.  Feel tender in your belly, often in the upper right part and under your ribs.  Have fever or chills.  Have skin or the white parts of your eyes turn yellow  (jaundice).  Have dark pee (urine) or pale poop (stool). How is this treated? Treatment for this condition depends on how bad you feel. If you have symptoms, you may need:  Home care, if symptoms are not very bad. ? Do not eat for 12-24 hours. Drink only water and clear liquids. ? Start to eat simple or clear foods after 1 or 2 days. Try broths and crackers. ? You may need medicines for pain or stomach upset or both. ? If you have an infection, you will need antibiotics.  A hospital stay, if you have very bad pain or a very bad infection.  Surgery to remove your gallbladder. You may need this if: ? Gallstones keep coming back. ? You have very bad symptoms.  Medicines to break up gallstones. Medicines: ? Are best for small gallstones. ? May be used for up to 6-12 months.  A procedure to find and take out gallstones or to break up gallstones. Follow these instructions at home: Medicines  Take over-the-counter and prescription medicines only as told by your doctor.  If you were prescribed an antibiotic medicine, take it as told by your doctor. Do not stop taking the antibiotic even if you start to feel better.  Ask your doctor if the medicine prescribed to you requires you to avoid driving or using machinery. Eating and drinking  Drink enough fluid to keep your urine pale yellow. Drink water or clear fluids. This is important when you have pain.  Eat   healthy foods. Choose: ? Fewer fatty foods, such as fried foods. ? Fewer refined carbs. Avoid breads and grains that are highly processed, such as white bread and white rice. Choose whole grains, such as whole-wheat bread and brown rice. ? More fiber. Almonds, fresh fruit, and beans are healthy sources. General instructions  Keep a healthy weight.  Keep all follow-up visits as told by your doctor. This is important. Where to find more information  National Institute of Diabetes and Digestive and Kidney Diseases:  www.niddk.nih.gov Contact a doctor if:  You have sudden pain in the upper right part of your belly. Pain might spread to your right shoulder, back, or chest.  You have been diagnosed with gallstones that have no symptoms and you get: ? Belly pain. ? Discomfort, burning, or fullness in the upper part of your abdomen.  You have dark urine or pale stools. Get help right away if:  You have sudden pain in the upper right part of your abdomen, and the pain lasts more than 2 hours.  You have pain in your abdomen, and: ? It lasts more than 5 hours. ? It keeps getting worse.  You have a fever or chills.  You keep feeling like you may vomit.  You keep vomiting.  Your skin or the white parts of your eyes turn yellow. Summary  Cholelithiasis happens when gallstones form in the gallbladder.  This condition may be caused by a blood disease, too much of a fat-like substance in the bile, or not enough bile salts in bile.  Treatment for this condition depends on how bad you feel.  If you have symptoms, do not eat or drink. You may need medicines. You may need a hospital stay for very bad pain or a very bad infection.  You may need surgery if gallstones keep coming back or if you have very bad symptoms. This information is not intended to replace advice given to you by your health care provider. Make sure you discuss any questions you have with your health care provider. Document Revised: 02/12/2019 Document Reviewed: 11/16/2018 Elsevier Patient Education  2021 Elsevier Inc.   Minimally Invasive Cholecystectomy Minimally invasive cholecystectomy is surgery to remove the gallbladder. The gallbladder is a pear-shaped organ that lies beneath the liver on the right side of the body. The gallbladder stores bile, which is a fluid that helps the body digest fats. Cholecystectomy is often done to treat inflammation of the gallbladder (cholecystitis). This condition is usually caused by a buildup of  gallstones (cholelithiasis) in the gallbladder. Gallstones can block the flow of bile, which can result in inflammation and pain. In severe cases, emergency surgery may be required. This procedure is done though small incisions in the abdomen, instead of one large incision. It is also called laparoscopic surgery. A thin scope with a camera (laparoscope) is inserted through one incision. Then surgical instruments are inserted through the other incisions. In some cases, a minimally invasive surgery may need to be changed to a surgery that is done through a larger incision. This is called open surgery. Tell a health care provider about:  Any allergies you have.  All medicines you are taking, including vitamins, herbs, eye drops, creams, and over-the-counter medicines.  Any problems you or family members have had with anesthetic medicines.  Any blood disorders you have.  Any surgeries you have had.  Any medical conditions you have.  Whether you are pregnant or may be pregnant. What are the risks? Generally, this is a   safe procedure. However, problems may occur, including:  Infection.  Bleeding.  Allergic reactions to medicines.  Damage to nearby structures or organs.  A stone remaining in the common bile duct. The common bile duct carries bile from the gallbladder into the small intestine.  A bile leak from the cyst duct that is clipped when your gallbladder is removed. Medicines Ask your health care provider about:  Changing or stopping your regular medicines. This is especially important if you are taking diabetes medicines or blood thinners.  Taking medicines such as aspirin and ibuprofen. These medicines can thin your blood. Do not take these medicines unless your health care provider tells you to take them.  Taking over-the-counter medicines, vitamins, herbs, and supplements. General instructions  Let your health care provider know if you develop a cold or an infection  before surgery.  Plan to have someone take you home from the hospital or clinic.  If you will be going home right after the procedure, plan to have someone with you for 24 hours.  Ask your health care provider: ? How your surgery site will be marked. ? What steps will be taken to help prevent infection. These may include:  Removing hair at the surgery site.  Washing skin with a germ-killing soap.  Taking antibiotic medicine. What happens during the procedure?  An IV will be inserted into one of your veins.  You will be given one or both of the following: ? A medicine to help you relax (sedative). ? A medicine to make you fall asleep (general anesthetic).  A breathing tube will be placed in your mouth.  Your surgeon will make several small incisions in your abdomen.  The laparoscope will be inserted through one of the small incisions. The camera on the laparoscope will send images to a monitor in the operating room. This lets your surgeon see inside your abdomen.  A gas will be pumped into your abdomen. This will expand your abdomen to give the surgeon more room to perform the surgery.  Other tools that are needed for the procedure will be inserted through the other incisions. The gallbladder will be removed through one of the incisions.  Your common bile duct may be examined. If stones are found in the common bile duct, they may be removed.  After your gallbladder has been removed, the incisions will be closed with stitches (sutures), staples, or skin glue.  Your incisions may be covered with a bandage (dressing). The procedure may vary among health care providers and hospitals.   What happens after the procedure?  Your blood pressure, heart rate, breathing rate, and blood oxygen level will be monitored until you leave the hospital or clinic.  You will be given medicines as needed to control your pain.  If you were given a sedative during the procedure, it can affect you  for several hours. Do not drive or operate machinery until your health care provider says that it is safe. Summary  Minimally invasive cholecystectomy, also called laparoscopic cholecystectomy, is surgery to remove the gallbladder using small incisions.  Tell your health care provider about all the medical conditions you have and all the medicines you are taking for those conditions.  Before the procedure, follow instructions about eating or drinking restrictions and changing or stopping medicines.  If you were given a sedative during the procedure, it can affect you for several hours. Do not drive or operate machinery until your health care provider says that it is safe. This   information is not intended to replace advice given to you by your health care provider. Make sure you discuss any questions you have with your health care provider. Document Revised: 09/28/2018 Document Reviewed: 09/28/2018 Elsevier Patient Education  2021 Elsevier Inc.   

## 2020-05-04 ENCOUNTER — Ambulatory Visit: Payer: Self-pay | Admitting: General Surgery

## 2020-05-05 NOTE — H&P (Signed)
[facility] History and Physical   Chief Complaint    Follow-up      Janet [name] is a 25 y.o. female.  HPI:  Janet [name] is a 25 yo with some chronic epigastric pain and RUQ pain who had prior imaging without stones but has a [place] that shows stones now. She is continuing to have pain and had an episode this past weekend. She is scheduled for EGD next week given her chronic issues.  She had previously been scheduled and had the EGD canceled due to her TSH being elevated. She is now back under control with her medication and is seeing DR. Avernei.  She says that her pain is in the RUQ and she had nausea and vomiting.   Past Medical History:  Diagnosis Date  . Allergy   . Anemia   . Anxiety   . Chronic kidney disease    stage 2 she says   . GERD (gastroesophageal reflux disease)   . Headache(784.0)   . 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  . Thyroid disease    Hashimotos     Past Surgical History:  Procedure Laterality Date  . THYROIDECTOMY N/A 07/01/2017   Procedure: TOTAL THYROIDECTOMY;  Surgeon: Darnell Level, MD;  Location: [facility];  Service: General;  Laterality: N/A;  . TONSILLECTOMY AND ADENOIDECTOMY Bilateral 01/2011  . TOTAL THYROIDECTOMY     Dr. Gerrit Friends 07-01-17    Family History  Problem Relation Age of Onset  . Heart attack Maternal Grandmother   . Heart Problems Maternal Grandfather   . Heart attack Maternal Grandfather   . Cancer Paternal Grandmother   . Cancer Paternal Grandfather   . Diabetes Mother   . Other Mother        gastropresis  . Anxiety disorder Mother   . Other Father        syncope-has a pacemaker  . COPD Father     Social History   Tobacco Use  . Smoking status: Current Some Day Smoker    Packs/day: 0.50    Years: 2.00    Pack years: 1.00    Types: Cigarettes  . Smokeless tobacco: Never Used  Vaping Use  . Vaping Use: Never used  Substance Use Topics  . Alcohol use: Not Currently    Comment: rare  .  Drug use: No    Medications: I have reviewed the patient's current medications. Allergies as of 05/02/2020   No Known Allergies     Medication List       Accurate as of May 02, 2020 10:52 AM. If you have any questions, ask your nurse or doctor.        acetaminophen 650 MG CR tablet Commonly known as: TYLENOL Take 650 mg by mouth every 8 (eight) hours as needed (for menstrual pain/cramps).   dicyclomine 10 MG capsule Commonly known as: Bentyl Take 1 capsule (10 mg total) by mouth 2 (two) times daily before a meal.   ergocalciferol 1.25 MG (50000 UT) capsule Commonly known as: VITAMIN D2 Take 50,000 Units by mouth once a week.   escitalopram 20 MG tablet Commonly known as: LEXAPRO Take 1 tablet (20 mg total) by mouth daily. What changed: when to take this   ferrous sulfate 324 (65 Fe) MG Tbec Take 1 tablet (324 mg total) by mouth daily. What changed: when to take this   furosemide 20 MG tablet Commonly known as: LASIX Take 2 tablets (40 mg total) by mouth every other  day. What changed:   how much to take  when to take this   ibuprofen 200 MG tablet Commonly known as: ADVIL Take 400-600 mg by mouth every 6 (six) hours as needed for headache.   liothyronine 5 MCG tablet Commonly known as: CYTOMEL Take 5 mcg by mouth daily.   norethindrone-ethinyl estradiol 1-20 MG-MCG tablet Commonly known as: LOESTRIN FE Take 1 tablet by mouth daily.   omeprazole 20 MG capsule Commonly known as: PRILOSEC Take 1 capsule (20 mg total) by mouth daily. What changed: when to take this   ondansetron 4 MG tablet Commonly known as: ZOFRAN Take 1 tablet (4 mg total) by mouth every 8 (eight) hours as needed for nausea or vomiting.   Tirosint 150 MCG Caps Generic drug: Levothyroxine Sodium Take 300 mg by mouth every morning.   vitamin B-12 1000 MCG tablet Commonly known as: CYANOCOBALAMIN Take 1,000 mcg by mouth daily.        ROS:  A comprehensive review of systems  was negative except for: Gastrointestinal: positive for abdominal pain, nausea and vomiting  Blood pressure 107/76, pulse 70, temperature 98.2 F (36.8 C), temperature source Oral, resp. rate 16, height 5\' 7"  (1.702 m), weight (!) 302 lb (137 kg), SpO2 96 %. Physical Exam Vitals reviewed.  Constitutional:      Appearance: She is obese.  HENT:     Head: Normocephalic.     Nose: Nose normal.     Mouth/Throat:     Mouth: Mucous membranes are moist.  Eyes:     Extraocular Movements: Extraocular movements intact.  Neck:     Comments: Thyroid incision noted Cardiovascular:     Rate and Rhythm: Normal rate and regular rhythm.  Pulmonary:     Effort: Pulmonary effort is normal.     Breath sounds: Normal breath sounds.  Abdominal:     General: There is no distension.     Palpations: Abdomen is soft.     Tenderness: There is abdominal tenderness in the right upper quadrant and epigastric area.  Musculoskeletal:        General: Normal range of motion.  Skin:    General: Skin is warm.  Neurological:     General: No focal deficit present.     Mental Status: She is alert and oriented to person, place, and time.  Psychiatric:        Mood and Affect: Mood normal.        Behavior: Behavior normal.        Thought Content: Thought content normal.        Judgment: Judgment normal.    Results: CLINICAL DATA:  Right upper quadrant pain  EXAM: ULTRASOUND ABDOMEN LIMITED RIGHT UPPER QUADRANT  COMPARISON:  12/15/2018  FINDINGS: Gallbladder:  Interval development of multiple gallstones and sludge. No gallbladder wall thickening or pericholecystic fluid. Sonographic Murphy sign negative per technologist.  Common bile duct:  Diameter: 6 mm  Liver:  No focal lesion.  Diffusely increased parenchymal echogenicity.  Portal vein is patent on color Doppler imaging with normal direction of blood flow towards the liver.  Other: None.  IMPRESSION: 1. Cholelithiasis  without additional sonographic evidence of acute cholecystitis. 2. Diffuse increased echogenicity of the hepatic parenchyma is a nonspecific indicator of hepatocellular dysfunction, most commonly steatosis.   Electronically Signed   By: 14/08/2018 M.D.   On: 04/11/2020 09:44   Assessment & Plan:  Janet [name] is a 25 y.o. female with gallstones on 22 now after repeat  imaging. She is getting EGD next week and I think she should keep this scheduled given everything. Will plan for lap chole in the upcoming weeks. She is seeing her endocrinologist and her lab work has all improved with changing her meds.   -PLAN: I counseled the patient about the indication, risks and benefits of laparoscopic cholecystectomy.  She understands there is a very small chance for bleeding, infection, injury to normal structures (including common bile duct), conversion to open surgery, persistent symptoms, evolution of postcholecystectomy diarrhea, need for secondary interventions, anesthesia reaction, cardiopulmonary issues and other risks not specifically detailed here. I described the expected recovery, the plan for follow-up and the restrictions during the recovery phase.  All questions were answered.   Discussed preop COVID testing.   All questions were answered to the satisfaction of the patient and family.    Lucretia Roers 05/02/2020, 10:52 AM

## 2020-05-08 ENCOUNTER — Other Ambulatory Visit: Payer: Self-pay

## 2020-05-08 ENCOUNTER — Other Ambulatory Visit (HOSPITAL_COMMUNITY)
Admission: RE | Admit: 2020-05-08 | Discharge: 2020-05-08 | Disposition: A | Discharge status: Home / Self Care | Payer: 59 | Source: Ambulatory Visit | Attending: Gastroenterology | Admitting: Gastroenterology

## 2020-05-08 DIAGNOSIS — F17210 Nicotine dependence, cigarettes, uncomplicated: Secondary | ICD-10-CM | POA: Diagnosis not present

## 2020-05-08 DIAGNOSIS — Z01812 Encounter for preprocedural laboratory examination: Secondary | ICD-10-CM | POA: Insufficient documentation

## 2020-05-08 DIAGNOSIS — Z8379 Family history of other diseases of the digestive system: Secondary | ICD-10-CM | POA: Diagnosis not present

## 2020-05-08 DIAGNOSIS — Z791 Long term (current) use of non-steroidal anti-inflammatories (NSAID): Secondary | ICD-10-CM | POA: Diagnosis not present

## 2020-05-08 DIAGNOSIS — Z20822 Contact with and (suspected) exposure to covid-19: Secondary | ICD-10-CM | POA: Diagnosis not present

## 2020-05-08 DIAGNOSIS — Z825 Family history of asthma and other chronic lower respiratory diseases: Secondary | ICD-10-CM | POA: Diagnosis not present

## 2020-05-08 DIAGNOSIS — K8020 Calculus of gallbladder without cholecystitis without obstruction: Secondary | ICD-10-CM | POA: Diagnosis present

## 2020-05-08 DIAGNOSIS — E890 Postprocedural hypothyroidism: Secondary | ICD-10-CM | POA: Diagnosis not present

## 2020-05-08 DIAGNOSIS — Z8249 Family history of ischemic heart disease and other diseases of the circulatory system: Secondary | ICD-10-CM | POA: Diagnosis not present

## 2020-05-08 DIAGNOSIS — Z809 Family history of malignant neoplasm, unspecified: Secondary | ICD-10-CM | POA: Diagnosis not present

## 2020-05-08 DIAGNOSIS — R1011 Right upper quadrant pain: Secondary | ICD-10-CM | POA: Diagnosis not present

## 2020-05-08 DIAGNOSIS — Z793 Long term (current) use of hormonal contraceptives: Secondary | ICD-10-CM | POA: Diagnosis not present

## 2020-05-08 DIAGNOSIS — N182 Chronic kidney disease, stage 2 (mild): Secondary | ICD-10-CM | POA: Diagnosis not present

## 2020-05-08 DIAGNOSIS — K219 Gastro-esophageal reflux disease without esophagitis: Secondary | ICD-10-CM | POA: Diagnosis not present

## 2020-05-08 DIAGNOSIS — Z79899 Other long term (current) drug therapy: Secondary | ICD-10-CM | POA: Diagnosis not present

## 2020-05-08 DIAGNOSIS — Z833 Family history of diabetes mellitus: Secondary | ICD-10-CM | POA: Diagnosis not present

## 2020-05-08 LAB — BASIC METABOLIC PANEL
Anion gap: 7 (ref 5–15)
BUN: 9 mg/dL (ref 6–20)
CO2: 22 mmol/L (ref 22–32)
Calcium: 8.4 mg/dL — ABNORMAL LOW (ref 8.9–10.3)
Chloride: 107 mmol/L (ref 98–111)
Creatinine, Ser: 0.62 mg/dL (ref 0.44–1.00)
GFR, Estimated: >60 mL/min (ref >60)
Glucose, Bld: 107 mg/dL — ABNORMAL HIGH (ref 70–99)
Potassium: 3.9 mmol/L (ref 3.5–5.1)
Sodium: 136 mmol/L (ref 135–145)

## 2020-05-08 LAB — SARS CORONAVIRUS 2 (TAT 6-24 HRS): SARS Coronavirus 2: NEGATIVE

## 2020-05-08 LAB — PREGNANCY, URINE: Preg Test, Ur: NEGATIVE

## 2020-05-09 ENCOUNTER — Ambulatory Visit (HOSPITAL_COMMUNITY)
Admission: RE | Admit: 2020-05-09 | Discharge: 2020-05-09 | Disposition: A | Discharge status: Home / Self Care | Payer: 59 | Attending: Gastroenterology | Admitting: Gastroenterology

## 2020-05-09 ENCOUNTER — Ambulatory Visit (HOSPITAL_COMMUNITY): Payer: 59 | Admitting: Anesthesiology

## 2020-05-09 ENCOUNTER — Encounter (HOSPITAL_COMMUNITY): Payer: Self-pay | Admitting: Gastroenterology

## 2020-05-09 ENCOUNTER — Other Ambulatory Visit: Payer: Self-pay

## 2020-05-09 ENCOUNTER — Encounter (HOSPITAL_COMMUNITY)
Admission: RE | Disposition: A | Discharge status: Home / Self Care | Payer: Self-pay | Source: Home / Self Care | Attending: Gastroenterology

## 2020-05-09 DIAGNOSIS — E890 Postprocedural hypothyroidism: Secondary | ICD-10-CM | POA: Insufficient documentation

## 2020-05-09 DIAGNOSIS — R1011 Right upper quadrant pain: Secondary | ICD-10-CM | POA: Diagnosis not present

## 2020-05-09 DIAGNOSIS — K219 Gastro-esophageal reflux disease without esophagitis: Secondary | ICD-10-CM | POA: Insufficient documentation

## 2020-05-09 DIAGNOSIS — Z791 Long term (current) use of non-steroidal anti-inflammatories (NSAID): Secondary | ICD-10-CM | POA: Insufficient documentation

## 2020-05-09 DIAGNOSIS — Z809 Family history of malignant neoplasm, unspecified: Secondary | ICD-10-CM | POA: Insufficient documentation

## 2020-05-09 DIAGNOSIS — Z825 Family history of asthma and other chronic lower respiratory diseases: Secondary | ICD-10-CM | POA: Insufficient documentation

## 2020-05-09 DIAGNOSIS — Z793 Long term (current) use of hormonal contraceptives: Secondary | ICD-10-CM | POA: Insufficient documentation

## 2020-05-09 DIAGNOSIS — Z8249 Family history of ischemic heart disease and other diseases of the circulatory system: Secondary | ICD-10-CM | POA: Insufficient documentation

## 2020-05-09 DIAGNOSIS — Z8379 Family history of other diseases of the digestive system: Secondary | ICD-10-CM | POA: Insufficient documentation

## 2020-05-09 DIAGNOSIS — F17210 Nicotine dependence, cigarettes, uncomplicated: Secondary | ICD-10-CM | POA: Insufficient documentation

## 2020-05-09 DIAGNOSIS — Z79899 Other long term (current) drug therapy: Secondary | ICD-10-CM | POA: Insufficient documentation

## 2020-05-09 DIAGNOSIS — Z833 Family history of diabetes mellitus: Secondary | ICD-10-CM | POA: Insufficient documentation

## 2020-05-09 DIAGNOSIS — N182 Chronic kidney disease, stage 2 (mild): Secondary | ICD-10-CM | POA: Insufficient documentation

## 2020-05-09 HISTORY — PX: BIOPSY: SHX5522

## 2020-05-09 HISTORY — PX: ESOPHAGOGASTRODUODENOSCOPY (EGD) WITH PROPOFOL: SHX5813

## 2020-05-09 SURGERY — ESOPHAGOGASTRODUODENOSCOPY (EGD) WITH PROPOFOL
Anesthesia: General

## 2020-05-09 MED ORDER — LACTATED RINGERS IV SOLN: INTRAVENOUS | Status: DC

## 2020-05-09 MED ORDER — KETAMINE HCL 50 MG/5ML IJ SOSY
PREFILLED_SYRINGE | INTRAMUSCULAR | Status: AC
  Filled 2020-05-09: qty 5

## 2020-05-09 MED ORDER — PROPOFOL 500 MG/50ML IV EMUL
INTRAVENOUS | Status: DC | PRN
  Administered 2020-05-09: 150 ug/kg/min via INTRAVENOUS

## 2020-05-09 MED ORDER — LIDOCAINE HCL (CARDIAC) PF 100 MG/5ML IV SOSY
PREFILLED_SYRINGE | INTRAVENOUS | Status: DC | PRN
  Administered 2020-05-09: 50 mg via INTRAVENOUS

## 2020-05-09 MED ORDER — STERILE WATER FOR IRRIGATION IR SOLN
Status: DC | PRN
  Administered 2020-05-09: 1.5 mL

## 2020-05-09 MED ORDER — KETAMINE HCL 10 MG/ML IJ SOLN
INTRAMUSCULAR | Status: DC | PRN
  Administered 2020-05-09: 20 mg via INTRAVENOUS

## 2020-05-09 MED ORDER — PROPOFOL 10 MG/ML IV BOLUS
INTRAVENOUS | Status: DC | PRN
  Administered 2020-05-09 (×2): 50 mg via INTRAVENOUS

## 2020-05-09 NOTE — H&P (Signed)
Janet [name] is an 25 y.o. female.   Chief Complaint: Right upper quadrant abdominal pain HPI: 25 year old female with past medical history of thyroid disease s/p thyroidectomy, GERD, IDA, coming to the hospital for evaluation of right upper quadrant abdominal pain.  The patient has presented recurrent episodes of right upper quadrant pain which lasts for a short span of time.  She has presented these episodes for the last 5 years.  Has also presented some nausea and diaphoresis when these episodes are present.  She is scheduled to undergo cholecystectomy on Monday for possible biliary stones.  Past Medical History:  Diagnosis Date  . Allergy   . Anemia   . Anxiety   . Chronic kidney disease    stage 2 she says   . GERD (gastroesophageal reflux disease)   . Headache(784.0)   . 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  . Thyroid disease    Hashimotos     Past Surgical History:  Procedure Laterality Date  . THYROIDECTOMY N/A 07/01/2017   Procedure: TOTAL THYROIDECTOMY;  Surgeon: Darnell Level, MD;  Location: [facility];  Service: General;  Laterality: N/A;  . TONSILLECTOMY AND ADENOIDECTOMY Bilateral 01/2011  . TOTAL THYROIDECTOMY     Dr. Gerrit Friends 07-01-17    Family History  Problem Relation Age of Onset  . Heart attack Maternal Grandmother   . Heart Problems Maternal Grandfather   . Heart attack Maternal Grandfather   . Cancer Paternal Grandmother   . Cancer Paternal Grandfather   . Diabetes Mother   . Other Mother        gastropresis  . Anxiety disorder Mother   . Other Father        syncope-has a pacemaker  . COPD Father    Social History:  reports that she has been smoking cigarettes. She has a 1.00 pack-year smoking history. She has never used smokeless tobacco. She reports previous alcohol use. She reports that she does not use drugs.  Allergies: No Known Allergies  Medications Prior to Admission  Medication Sig Dispense Refill  . ergocalciferol (VITAMIN D2) 1.25 MG  (50000 UT) capsule Take 50,000 Units by mouth once a week.    . escitalopram (LEXAPRO) 20 MG tablet Take 1 tablet (20 mg total) by mouth daily. (Patient taking differently: Take 20 mg by mouth at bedtime.) 90 tablet 3  . ferrous sulfate 324 (65 Fe) MG TBEC Take 1 tablet (324 mg total) by mouth daily. (Patient taking differently: Take 324 mg by mouth at bedtime.) 90 tablet 3  . furosemide (LASIX) 20 MG tablet Take 2 tablets (40 mg total) by mouth every other day. (Patient taking differently: Take 20-40 mg by mouth daily.) 90 tablet 3  . ibuprofen (ADVIL) 200 MG tablet Take 400-600 mg by mouth every 6 (six) hours as needed for headache.    . liothyronine (CYTOMEL) 5 MCG tablet Take 5 mcg by mouth daily.    . norethindrone-ethinyl estradiol (LOESTRIN FE) 1-20 MG-MCG tablet Take 1 tablet by mouth daily. 1 Package 12  . omeprazole (PRILOSEC) 20 MG capsule Take 1 capsule (20 mg total) by mouth daily. (Patient taking differently: Take 20 mg by mouth daily after lunch.) 30 capsule 3  . ondansetron (ZOFRAN) 4 MG tablet Take 1 tablet (4 mg total) by mouth every 8 (eight) hours as needed for nausea or vomiting. 30 tablet 1  . TIROSINT 150 MCG CAPS Take 300 mg by mouth every morning.    . vitamin B-12 (CYANOCOBALAMIN) 1000 MCG  tablet Take 1,000 mcg by mouth daily.    Marland Kitchen acetaminophen (TYLENOL) 650 MG CR tablet Take 650 mg by mouth every 8 (eight) hours as needed (for menstrual pain/cramps).    . dicyclomine (BENTYL) 10 MG capsule Take 1 capsule (10 mg total) by mouth 2 (two) times daily before a meal. 60 capsule 5    Results for orders placed or performed during the hospital encounter of 05/09/20 (from the past 48 hour(s))  Basic metabolic panel     Status: Abnormal   Collection Time: 05/08/20  9:12 AM  Result Value Ref Range   Sodium 136 135 - 145 mmol/L   Potassium 3.9 3.5 - 5.1 mmol/L   Chloride 107 98 - 111 mmol/L   CO2 22 22 - 32 mmol/L   Glucose, Bld 107 (H) 70 - 99 mg/dL    Comment: Glucose  reference range applies only to samples taken after fasting for at least 8 hours.   BUN 9 6 - 20 mg/dL   Creatinine, Ser 7.34 0.44 - 1.00 mg/dL   Calcium 8.4 (L) 8.9 - 10.3 mg/dL   GFR, Estimated >19 >37 mL/min    Comment: (NOTE) Calculated using the CKD-EPI Creatinine Equation (2021)    Anion gap 7 5 - 15    Comment: Performed at [facility], [address], [name], [place] [address]  Pregnancy, urine     Status: None   Collection Time: 05/08/20  9:12 AM  Result Value Ref Range   Preg Test, Ur NEGATIVE NEGATIVE    Comment:        THE SENSITIVITY OF THIS METHODOLOGY IS >20 mIU/mL. Performed at [facility], [address], [place], [place] [address]    No results found.  Review of Systems  Constitutional: Negative.   HENT: Negative.   Eyes: Negative.   Respiratory: Negative.   Cardiovascular: Negative.   Gastrointestinal: Positive for abdominal pain.  Endocrine: Negative.   Genitourinary: Negative.   Musculoskeletal: Negative.   Skin: Negative.   Allergic/Immunologic: Negative.   Neurological: Negative.   Hematological: Negative.   Psychiatric/Behavioral: Negative.     Blood pressure 133/74, pulse 63, temperature 97.9 F (36.6 C), temperature source Oral, resp. rate 12, height 5\' 7"  (1.702 m), weight 136.1 kg, SpO2 97 %. Physical Exam  GENERAL: The patient is AO x3, in no acute distress. Obese.  HEENT: Head is normocephalic and atraumatic. EOMI are intact. Mouth is well hydrated and without lesions. NECK: Supple. No masses LUNGS: Clear to auscultation. No presence of rhonchi/wheezing/rales. Adequate chest expansion HEART: RRR, normal s1 and s2. ABDOMEN: Soft, nontender, no guarding, no peritoneal signs, and nondistended. BS +. No masses. EXTREMITIES: Without any cyanosis, clubbing, rash, lesions or edema. NEUROLOGIC: AOx3, no focal motor deficit. SKIN: no jaundice, no rashes   Assessment/Plan 25 year old female with past medical history of thyroid disease s/p  thyroidectomy, GERD, IDA, coming to the hospital for evaluation of right upper quadrant abdominal pain. Will proceed with EGD.  22, MD 05/09/2020, 9:34 AM

## 2020-05-09 NOTE — Anesthesia Preprocedure Evaluation (Addendum)
Anesthesia Evaluation  Patient identified by MRN, date of birth, ID band Patient awake    Reviewed: Allergy & Precautions, NPO status , Patient's Chart, lab work & pertinent test results  History of Anesthesia Complications Negative for: history of anesthetic complications  Airway        Dental  (+) Dental Advisory Given   Pulmonary sleep apnea and Continuous Positive Airway Pressure Ventilation , Current Smoker,    Pulmonary exam normal breath sounds clear to auscultation       Cardiovascular Exercise Tolerance: Good Normal cardiovascular exam+ Valvular Problems/Murmurs  Rhythm:Regular Rate:Normal     Neuro/Psych  Headaches,  Neuromuscular disease    GI/Hepatic GERD  Medicated,  Endo/Other  Hypothyroidism Morbid obesity  Renal/GU Renal InsufficiencyRenal disease     Musculoskeletal negative musculoskeletal ROS (+)   Abdominal   Peds  Hematology  (+) anemia ,   Anesthesia Other Findings   Reproductive/Obstetrics negative OB ROS                            Anesthesia Physical Anesthesia Plan  ASA: III  Anesthesia Plan: General   Post-op Pain Management:    Induction: Intravenous  PONV Risk Score and Plan: Propofol infusion  Airway Management Planned: Nasal Cannula and Natural Airway  Additional Equipment:   Intra-op Plan:   Post-operative Plan:   Informed Consent: I have reviewed the patients History and Physical, chart, labs and discussed the procedure including the risks, benefits and alternatives for the proposed anesthesia with the patient or authorized representative who has indicated his/her understanding and acceptance.       Plan Discussed with: CRNA and Surgeon  Anesthesia Plan Comments:         Anesthesia Quick Evaluation

## 2020-05-09 NOTE — Anesthesia Postprocedure Evaluation (Signed)
Anesthesia Post Note  Patient: Janet [name]  Procedure(s) Performed: ESOPHAGOGASTRODUODENOSCOPY (EGD) WITH PROPOFOL (N/A ) BIOPSY  Patient location during evaluation: Endoscopy Anesthesia Type: General Level of consciousness: awake and alert and oriented Pain management: pain level controlled Vital Signs Assessment: post-procedure vital signs reviewed and stable Respiratory status: spontaneous breathing and respiratory function stable Cardiovascular status: stable and blood pressure returned to baseline Postop Assessment: no apparent nausea or vomiting Anesthetic complications: no   No complications documented.   Last Vitals:  Vitals:   05/09/20 0848 05/09/20 0959  BP: 133/74 125/75  Pulse: 63 71  Resp: 12 18  Temp: 36.6 C 36.5 C  SpO2: 97% 96%    Last Pain:  Vitals:   05/09/20 0959  TempSrc: Oral  PainSc: 0-No pain                 Hendrix Console C [place]

## 2020-05-09 NOTE — Op Note (Signed)
[facility] Patient Name: Janet [name] Procedure Date: 05/09/2020 8:38 AM MRN: [id number] Date of Birth: 12/16/1995 Attending MD: Katrinka Blazing ,  CSN: [id number] Age: 25 Admit Type: Outpatient Procedure:                Upper GI endoscopy Indications:              Abdominal pain in the right upper quadrant Providers:                Katrinka Blazing, Angelica Ran, Pandora Leiter,                            Technician Referring MD:              Medicines:                Monitored Anesthesia Care Complications:            No immediate complications. Estimated Blood Loss:     Estimated blood loss: none. Procedure:                Pre-Anesthesia Assessment:                           - Prior to the procedure, a History and Physical                            was performed, and patient medications, allergies                            and sensitivities were reviewed. The patient's                            tolerance of previous anesthesia was reviewed.                           - The risks and benefits of the procedure and the                            sedation options and risks were discussed with the                            patient. All questions were answered and informed                            consent was obtained.                           - ASA Grade Assessment: II - A patient with mild                            systemic disease.                           After obtaining informed consent, the endoscope was                            passed under direct vision. Throughout the  procedure, the patient's blood pressure, pulse, and                            oxygen saturations were monitored continuously.The                            upper GI endoscopy was accomplished without                            difficulty. The patient tolerated the procedure                            well. The GIF-H190 ([id number]) was introduced through                             the mouth, and advanced to the second part of                            duodenum. Scope In: 9:50:02 AM Scope Out: 9:55:05 AM Total Procedure Duration: 0 hours 5 minutes 3 seconds  Findings:      The examined esophagus was normal.      The entire examined stomach was normal.      The examined duodenum was normal. Biopsies were taken with a cold       forceps for histology. Impression:               - Normal esophagus.                           - Normal stomach.                           - Normal examined duodenum. Biopsied. Moderate Sedation:      Per Anesthesia Care Recommendation:           - Discharge patient to home (ambulatory).                           - Resume previous diet.                           - Await pathology results. Procedure Code(s):        --- Professional ---                           [phone], Esophagogastroduodenoscopy, flexible,                            transoral; with biopsy, single or multiple Diagnosis Code(s):        --- Professional ---                           R10.11, Right upper quadrant pain CPT copyright 2019 American Medical Association. All rights reserved. The codes documented in this report are preliminary and upon coder review may  be revised to meet current compliance requirements. Katrinka Blazing, MD Katrinka Blazing,  05/09/2020 9:57:28 AM This report has been signed electronically. Number of Addenda: 0

## 2020-05-09 NOTE — Discharge Instructions (Signed)
You are being discharged to home.  Resume your previous diet.  We are waiting for your pathology results.    Upper Endoscopy, Adult, Care After This sheet gives you information about how to care for yourself after your procedure. Your health care provider may also give you more specific instructions. If you have problems or questions, contact your health care provider. What can I expect after the procedure? After the procedure, it is common to have:  A sore throat.  Mild stomach pain or discomfort.  Bloating.  Nausea. Follow these instructions at home:  Follow instructions from your health care provider about what to eat or drink after your procedure.  Return to your normal activities as told by your health care provider. Ask your health care provider what activities are safe for you.  Take over-the-counter and prescription medicines only as told by your health care provider.  If you were given a sedative during the procedure, it can affect you for several hours. Do not drive or operate machinery until your health care provider says that it is safe.  Keep all follow-up visits as told by your health care provider. This is important.   Contact a health care provider if you have:  A sore throat that lasts longer than one day.  Trouble swallowing. Get help right away if:  You vomit blood or your vomit looks like coffee grounds.  You have: ? A fever. ? Bloody, black, or tarry stools. ? A severe sore throat or you cannot swallow. ? Difficulty breathing. ? Severe pain in your chest or abdomen. Summary  After the procedure, it is common to have a sore throat, mild stomach discomfort, bloating, and nausea.  If you were given a sedative during the procedure, it can affect you for several hours. Do not drive or operate machinery until your health care provider says that it is safe.  Follow instructions from your health care provider about what to eat or drink after your  procedure.  Return to your normal activities as told by your health care provider. This information is not intended to replace advice given to you by your health care provider. Make sure you discuss any questions you have with your health care provider. Document Revised: 12/22/2018 Document Reviewed: 05/26/2017 Elsevier Patient Education  2021 Elsevier Inc.  

## 2020-05-09 NOTE — Anesthesia Procedure Notes (Signed)
Date/Time: 05/09/2020 9:51 AM Performed by: Julian Reil, CRNA Pre-anesthesia Checklist: Patient identified, Suction available, Emergency Drugs available and Patient being monitored Patient Re-evaluated:Patient Re-evaluated prior to induction Oxygen Delivery Method: Nasal cannula Induction Type: IV induction Placement Confirmation: positive ETCO2

## 2020-05-09 NOTE — Transfer of Care (Signed)
Immediate Anesthesia Transfer of Care Note  Patient: Janet [name]  Procedure(s) Performed: ESOPHAGOGASTRODUODENOSCOPY (EGD) WITH PROPOFOL (N/A ) BIOPSY  Patient Location: Endoscopy Unit  Anesthesia Type:General  Level of Consciousness: awake, alert  and oriented  Airway & Oxygen Therapy: Patient Spontanous Breathing  Post-op Assessment: Report given to RN and Post -op Vital signs reviewed and stable  Post vital signs: Reviewed and stable  Last Vitals:  Vitals Value Taken Time  BP 125/75 05/09/20 0959  Temp 36.5 C 05/09/20 0959  Pulse 71 05/09/20 0959  Resp 18 05/09/20 0959  SpO2 96 % 05/09/20 0959    Last Pain:  Vitals:   05/09/20 0959  TempSrc: Oral  PainSc: 0-No pain      Patients Stated Pain Goal: 6 (05/09/20 0848)  Complications: No complications documented.

## 2020-05-10 LAB — SURGICAL PATHOLOGY

## 2020-05-11 NOTE — Patient Instructions (Signed)
Janet [name]  05/11/2020     @PREFPERIOPPHARMACY @   Your procedure is scheduled on   05/15/2020   Report to 07/15/2020 at  0830  A.M.   Call this number if you have problems the morning of surgery:  [phone]   Remember:  Do not eat or drink after midnight.                         Take these medicines the morning of surgery with A SIP OF WATER  Cytomel, prilosec, zofran (if needed), tirosint.    Place clean sheets on your bed the night before your procedure and DO NOT sleep with pets this night.  Shower with CHG the night before and the morning of your procedure. DO NOT use CHG on your face, hair or genitals.  After each shower, dry off with a clean towel, put on clean, comfortable clothes and brush your teeth.       Do not wear jewelry, make-up or nail polish.  Do not wear lotions, powders, or perfumes, or deodorant.  Do not shave 48 hours prior to surgery.  Men may shave face and neck.  Do not bring valuables to the hospital.  [facility] is not responsible for any belongings or valuables.    Contacts, dentures or bridgework may not be worn into surgery.  Leave your suitcase in the car.  After surgery it may be brought to your room.  For patients admitted to the hospital, discharge time will be determined by your treatment team.  Patients discharged the day of surgery will not be allowed to drive home and must have someone with them for 24 hours.   Special instructions:  DO NOT smoke tobacco or vape for 24 hours before your procedure.  Please read over the following fact sheets that you were given. Coughing and Deep Breathing, Surgical Site Infection Prevention, Anesthesia Post-op Instructions and Care and Recovery After Surgery       Minimally Invasive Cholecystectomy, Care After This sheet gives you information about how to care for yourself after your procedure. Your health care provider may also give you more specific instructions. If you have  problems or questions, contact your health care provider. What can I expect after the procedure? After the procedure, it is common to have:  Pain at your incision sites. You will be given medicines to control this pain.  Mild nausea or vomiting.  Bloating and possible shoulder pain from the gas that was used during the procedure. Follow these instructions at home: Medicines  Take over-the-counter and prescription medicines only as told by your health care provider.  If you were prescribed an antibiotic medicine, take or use it as told by your health care provider. Do not stop using the antibiotic even if you start to feel better.  Ask your health care provider if the medicine prescribed to you: ? Requires you to avoid driving or using machinery. ? Can cause constipation. You may need to take these actions to prevent or treat constipation:  Drink enough fluid to keep your urine pale yellow.  Take over-the-counter or prescription medicines.  Eat foods that are high in fiber, such as beans, whole grains, and fresh fruits and vegetables.  Limit foods that are high in fat and processed sugars, such as fried or sweet foods. Incision care  Follow instructions from your health care provider about how to take care of your incisions.  Make sure you: ? Wash your hands with soap and water for at least 20 seconds before and after you change your bandage (dressing). If soap and water are not available, use hand sanitizer. ? Change your dressing as told by your health care provider. ? Leave stitches (sutures), skin glue, or adhesive strips in place. These skin closures may need to be in place for 2 weeks or longer. If adhesive strip edges start to loosen and curl up, you may trim the loose edges. Do not remove adhesive strips completely unless your health care provider tells you to do that.  Do not take baths, swim, or use a hot tub until your health care provider approves. Ask your health care  provider if you may take showers. You may only be allowed to take sponge baths.  Check your incision area every day for signs of infection. Check for: ? More redness, swelling, or pain. ? Fluid or blood. ? Warmth. ? Pus or a bad smell.   Activity  Rest as told by your health care provider.  Avoid sitting for a long time without moving. Get up to take short walks every 1-2 hours. This is important to improve blood flow and breathing. Ask for help if you feel weak or unsteady.  Do not lift anything that is heavier than 10 lb (4.5 kg), or the limit that you are told, until your health care provider says that it is safe.  Do not play contact sports until your health care provider approves.  Do not return to work or school until your health care provider approves.  Return to your normal activities as told by your health care provider. Ask your health care provider what activities are safe for you. General instructions  If you were given a sedative during the procedure, it can affect you for several hours. Do not drive or operate machinery until your health care provider says that it is safe.  Keep all follow-up visits as told by your health care provider. This is important. Contact a health care provider if:  You develop a rash.  You have more redness, swelling, or pain around your incisions.  You have fluid or blood coming from your incisions.  Your incisions feel warm to the touch.  You have pus or a bad smell coming from your incisions.  You have a fever.  One or more of your incisions breaks open. Get help right away if:  You have trouble breathing.  You have chest pain.  You have increasing pain in your shoulders.  You faint or feel dizzy when you stand.  You have severe pain in your abdomen.  You have nausea or vomiting that lasts for more than one day.  You have leg pain. Summary  After your procedure, it is common to have pain at the incision sites. You may  also have nausea or bloating.  Follow your health care provider's instructions about medicine, activity restrictions, and caring for your incision areas. Do not do activities that require a lot of effort.  Contact a health care provider if you have a fever or other signs of infection, such as more redness, swelling, or pain around the incisions.  Get help right away if you have chest pain, increasing pain in the shoulders, or trouble breathing. This information is not intended to replace advice given to you by your health care provider. Make sure you discuss any questions you have with your health care provider. Document Revised: 09/23/2018 Document Reviewed:  09/23/2018 Elsevier Patient Education  2021 Elsevier Inc. General Anesthesia, Adult, Care After This sheet gives you information about how to care for yourself after your procedure. Your health care provider may also give you more specific instructions. If you have problems or questions, contact your health care provider. What can I expect after the procedure? After the procedure, the following side effects are common:  Pain or discomfort at the IV site.  Nausea.  Vomiting.  Sore throat.  Trouble concentrating.  Feeling cold or chills.  Feeling weak or tired.  Sleepiness and fatigue.  Soreness and body aches. These side effects can affect parts of the body that were not involved in surgery. Follow these instructions at home: For the time period you were told by your health care provider:  Rest.  Do not participate in activities where you could fall or become injured.  Do not drive or use machinery.  Do not drink alcohol.  Do not take sleeping pills or medicines that cause drowsiness.  Do not make important decisions or sign legal documents.  Do not take care of children on your own.   Eating and drinking  Follow any instructions from your health care provider about eating or drinking restrictions.  When you  feel hungry, start by eating small amounts of foods that are soft and easy to digest (bland), such as toast. Gradually return to your regular diet.  Drink enough fluid to keep your urine pale yellow.  If you vomit, rehydrate by drinking water, juice, or clear broth. General instructions  If you have sleep apnea, surgery and certain medicines can increase your risk for breathing problems. Follow instructions from your health care provider about wearing your sleep device: ? Anytime you are sleeping, including during daytime naps. ? While taking prescription pain medicines, sleeping medicines, or medicines that make you drowsy.  Have a responsible adult stay with you for the time you are told. It is important to have someone help care for you until you are awake and alert.  Return to your normal activities as told by your health care provider. Ask your health care provider what activities are safe for you.  Take over-the-counter and prescription medicines only as told by your health care provider.  If you smoke, do not smoke without supervision.  Keep all follow-up visits as told by your health care provider. This is important. Contact a health care provider if:  You have nausea or vomiting that does not get better with medicine.  You cannot eat or drink without vomiting.  You have pain that does not get better with medicine.  You are unable to pass urine.  You develop a skin rash.  You have a fever.  You have redness around your IV site that gets worse. Get help right away if:  You have difficulty breathing.  You have chest pain.  You have blood in your urine or stool, or you vomit blood. Summary  After the procedure, it is common to have a sore throat or nausea. It is also common to feel tired.  Have a responsible adult stay with you for the time you are told. It is important to have someone help care for you until you are awake and alert.  When you feel hungry, start  by eating small amounts of foods that are soft and easy to digest (bland), such as toast. Gradually return to your regular diet.  Drink enough fluid to keep your urine pale yellow.  Return to your  normal activities as told by your health care provider. Ask your health care provider what activities are safe for you. This information is not intended to replace advice given to you by your health care provider. Make sure you discuss any questions you have with your health care provider. Document Revised: 09/09/2019 Document Reviewed: 04/08/2019 Elsevier Patient Education  2021 [employer].

## 2020-05-12 ENCOUNTER — Other Ambulatory Visit (HOSPITAL_COMMUNITY)
Admission: RE | Admit: 2020-05-12 | Discharge: 2020-05-12 | Disposition: A | Discharge status: Home / Self Care | Payer: 59 | Source: Ambulatory Visit | Attending: General Surgery | Admitting: General Surgery

## 2020-05-12 ENCOUNTER — Other Ambulatory Visit: Payer: Self-pay

## 2020-05-12 ENCOUNTER — Encounter (HOSPITAL_COMMUNITY): Payer: Self-pay

## 2020-05-12 ENCOUNTER — Encounter (HOSPITAL_COMMUNITY)
Admission: RE | Admit: 2020-05-12 | Discharge: 2020-05-12 | Disposition: A | Discharge status: Home / Self Care | Payer: 59 | Source: Ambulatory Visit | Attending: General Surgery | Admitting: General Surgery

## 2020-05-12 DIAGNOSIS — Z20822 Contact with and (suspected) exposure to covid-19: Secondary | ICD-10-CM | POA: Diagnosis not present

## 2020-05-12 DIAGNOSIS — Z01818 Encounter for other preprocedural examination: Secondary | ICD-10-CM | POA: Insufficient documentation

## 2020-05-12 HISTORY — DX: Hypothyroidism, unspecified: E03.9

## 2020-05-12 HISTORY — DX: Sleep apnea, unspecified: G47.30

## 2020-05-12 LAB — PREGNANCY, URINE: Preg Test, Ur: NEGATIVE

## 2020-05-12 LAB — SARS CORONAVIRUS 2 (TAT 6-24 HRS): SARS Coronavirus 2: NEGATIVE

## 2020-05-15 ENCOUNTER — Ambulatory Visit (HOSPITAL_COMMUNITY)
Admission: RE | Admit: 2020-05-15 | Discharge: 2020-05-15 | Disposition: A | Discharge status: Home / Self Care | Payer: 59 | Attending: General Surgery | Admitting: General Surgery

## 2020-05-15 ENCOUNTER — Ambulatory Visit (HOSPITAL_COMMUNITY): Payer: 59 | Admitting: Certified Registered"

## 2020-05-15 ENCOUNTER — Encounter (HOSPITAL_COMMUNITY)
Admission: RE | Disposition: A | Discharge status: Home / Self Care | Payer: Self-pay | Source: Home / Self Care | Attending: General Surgery

## 2020-05-15 ENCOUNTER — Encounter (HOSPITAL_COMMUNITY): Payer: Self-pay | Admitting: General Surgery

## 2020-05-15 DIAGNOSIS — Z79899 Other long term (current) drug therapy: Secondary | ICD-10-CM | POA: Diagnosis not present

## 2020-05-15 DIAGNOSIS — F17210 Nicotine dependence, cigarettes, uncomplicated: Secondary | ICD-10-CM | POA: Insufficient documentation

## 2020-05-15 DIAGNOSIS — K8020 Calculus of gallbladder without cholecystitis without obstruction: Secondary | ICD-10-CM | POA: Diagnosis present

## 2020-05-15 DIAGNOSIS — K8010 Calculus of gallbladder with chronic cholecystitis without obstruction: Secondary | ICD-10-CM | POA: Diagnosis not present

## 2020-05-15 DIAGNOSIS — N182 Chronic kidney disease, stage 2 (mild): Secondary | ICD-10-CM | POA: Diagnosis not present

## 2020-05-15 DIAGNOSIS — R112 Nausea with vomiting, unspecified: Secondary | ICD-10-CM

## 2020-05-15 DIAGNOSIS — E890 Postprocedural hypothyroidism: Secondary | ICD-10-CM | POA: Insufficient documentation

## 2020-05-15 HISTORY — PX: CHOLECYSTECTOMY: SHX55

## 2020-05-15 SURGERY — LAPAROSCOPIC CHOLECYSTECTOMY
Anesthesia: General | Site: Abdomen

## 2020-05-15 MED ORDER — ONDANSETRON HCL 4 MG/2ML IJ SOLN
INTRAMUSCULAR | Status: AC
  Filled 2020-05-15: qty 2

## 2020-05-15 MED ORDER — EPHEDRINE 5 MG/ML INJ
INTRAVENOUS | Status: AC
  Filled 2020-05-15: qty 10

## 2020-05-15 MED ORDER — CHLORHEXIDINE GLUCONATE CLOTH 2 % EX PADS: 6.0000 | MEDICATED_PAD | Freq: Once | CUTANEOUS | Status: DC

## 2020-05-15 MED ORDER — OXYCODONE HCL 5 MG PO TABS: 5.0000 mg | ORAL_TABLET | ORAL | 0 refills | Status: DC | PRN

## 2020-05-15 MED ORDER — ONDANSETRON HCL 4 MG/2ML IJ SOLN
INTRAMUSCULAR | Status: DC | PRN
  Administered 2020-05-15: 4 mg via INTRAVENOUS

## 2020-05-15 MED ORDER — MIDAZOLAM HCL 5 MG/5ML IJ SOLN
INTRAMUSCULAR | Status: DC | PRN
  Administered 2020-05-15: 2 mg via INTRAVENOUS

## 2020-05-15 MED ORDER — BUPIVACAINE HCL (PF) 0.5 % IJ SOLN
INTRAMUSCULAR | Status: DC | PRN
  Administered 2020-05-15: 10 mL

## 2020-05-15 MED ORDER — FENTANYL CITRATE (PF) 250 MCG/5ML IJ SOLN
INTRAMUSCULAR | Status: AC
  Filled 2020-05-15: qty 5

## 2020-05-15 MED ORDER — ROCURONIUM BROMIDE 100 MG/10ML IV SOLN
INTRAVENOUS | Status: DC | PRN
  Administered 2020-05-15: 50 mg via INTRAVENOUS

## 2020-05-15 MED ORDER — EPHEDRINE SULFATE-NACL 50-0.9 MG/10ML-% IV SOSY
PREFILLED_SYRINGE | INTRAVENOUS | Status: DC | PRN
  Administered 2020-05-15 (×2): 10 mg via INTRAVENOUS

## 2020-05-15 MED ORDER — GLYCOPYRROLATE PF 0.2 MG/ML IJ SOSY
PREFILLED_SYRINGE | INTRAMUSCULAR | Status: AC
  Filled 2020-05-15: qty 3

## 2020-05-15 MED ORDER — ONDANSETRON HCL 4 MG PO TABS: 4.0000 mg | ORAL_TABLET | Freq: Three times a day (TID) | ORAL | 0 refills | Status: DC | PRN

## 2020-05-15 MED ORDER — SCOPOLAMINE 1 MG/3DAYS TD PT72
1.0000 | MEDICATED_PATCH | Freq: Once | TRANSDERMAL | Status: DC
  Administered 2020-05-15: 1.5 mg via TRANSDERMAL
  Filled 2020-05-15: qty 1

## 2020-05-15 MED ORDER — SUCCINYLCHOLINE CHLORIDE 200 MG/10ML IV SOSY
PREFILLED_SYRINGE | INTRAVENOUS | Status: AC
  Filled 2020-05-15: qty 10

## 2020-05-15 MED ORDER — LACTATED RINGERS IV SOLN
INTRAVENOUS | Status: DC
  Administered 2020-05-15: 1000 mL via INTRAVENOUS

## 2020-05-15 MED ORDER — FENTANYL CITRATE (PF) 250 MCG/5ML IJ SOLN
INTRAMUSCULAR | Status: DC | PRN
  Administered 2020-05-15 (×3): 50 ug via INTRAVENOUS
  Administered 2020-05-15: 100 ug via INTRAVENOUS

## 2020-05-15 MED ORDER — PROPOFOL 10 MG/ML IV BOLUS
INTRAVENOUS | Status: DC | PRN
  Administered 2020-05-15: 250 mg via INTRAVENOUS

## 2020-05-15 MED ORDER — MIDAZOLAM HCL 2 MG/2ML IJ SOLN
INTRAMUSCULAR | Status: AC
  Filled 2020-05-15: qty 2

## 2020-05-15 MED ORDER — MEPERIDINE HCL 50 MG/ML IJ SOLN: 6.2500 mg | INTRAMUSCULAR | Status: DC | PRN

## 2020-05-15 MED ORDER — SUGAMMADEX SODIUM 200 MG/2ML IV SOLN
INTRAVENOUS | Status: DC | PRN
  Administered 2020-05-15: 400 mg via INTRAVENOUS

## 2020-05-15 MED ORDER — SODIUM CHLORIDE 0.9 % IV SOLN
2.0000 g | INTRAVENOUS | Status: AC
  Administered 2020-05-15: 2 g via INTRAVENOUS
  Filled 2020-05-15: qty 2

## 2020-05-15 MED ORDER — HYDROMORPHONE HCL 1 MG/ML IJ SOLN
0.2500 mg | INTRAMUSCULAR | Status: DC | PRN
  Administered 2020-05-15 (×3): 0.5 mg via INTRAVENOUS
  Filled 2020-05-15 (×2): qty 0.5

## 2020-05-15 MED ORDER — CHLORHEXIDINE GLUCONATE 0.12 % MT SOLN
15.0000 mL | Freq: Once | OROMUCOSAL | Status: AC
  Administered 2020-05-15: 15 mL via OROMUCOSAL
  Filled 2020-05-15: qty 15

## 2020-05-15 MED ORDER — ONDANSETRON HCL 4 MG/2ML IJ SOLN: 4.0000 mg | Freq: Once | INTRAMUSCULAR | Status: DC | PRN

## 2020-05-15 MED ORDER — LIDOCAINE HCL (PF) 2 % IJ SOLN
INTRAMUSCULAR | Status: AC
  Filled 2020-05-15: qty 20

## 2020-05-15 MED ORDER — ROCURONIUM BROMIDE 10 MG/ML (PF) SYRINGE
PREFILLED_SYRINGE | INTRAVENOUS | Status: AC
  Filled 2020-05-15: qty 20

## 2020-05-15 MED ORDER — DEXAMETHASONE SODIUM PHOSPHATE 10 MG/ML IJ SOLN
INTRAMUSCULAR | Status: DC | PRN
  Administered 2020-05-15: 10 mg via INTRAVENOUS

## 2020-05-15 MED ORDER — ORAL CARE MOUTH RINSE: 15.0000 mL | Freq: Once | OROMUCOSAL | Status: AC

## 2020-05-15 MED ORDER — BUPIVACAINE HCL (PF) 0.5 % IJ SOLN
INTRAMUSCULAR | Status: AC
  Filled 2020-05-15: qty 30

## 2020-05-15 MED ORDER — SODIUM CHLORIDE 0.9 % IR SOLN
Status: DC | PRN
  Administered 2020-05-15: 1000 mL

## 2020-05-15 MED ORDER — DEXAMETHASONE SODIUM PHOSPHATE 10 MG/ML IJ SOLN
INTRAMUSCULAR | Status: AC
  Filled 2020-05-15: qty 1

## 2020-05-15 MED ORDER — SUGAMMADEX SODIUM 500 MG/5ML IV SOLN
INTRAVENOUS | Status: AC
  Filled 2020-05-15: qty 5

## 2020-05-15 MED ORDER — HYDROMORPHONE HCL 1 MG/ML IJ SOLN
INTRAMUSCULAR | Status: AC
  Filled 2020-05-15: qty 0.5

## 2020-05-15 MED ORDER — HEMOSTATIC AGENTS (NO CHARGE) OPTIME
TOPICAL | Status: DC | PRN
  Administered 2020-05-15: 1 via TOPICAL

## 2020-05-15 SURGICAL SUPPLY — 45 items
ADH SKN CLS APL DERMABOND .7 (GAUZE/BANDAGES/DRESSINGS) ×1
APL PRP STRL LF DISP 70% ISPRP (MISCELLANEOUS) ×1
APL SWBSTK 6 STRL LF DISP (MISCELLANEOUS) ×1
APPLICATOR COTTON TIP 6 STRL (MISCELLANEOUS) ×1 IMPLANT
APPLICATOR COTTON TIP 6IN STRL (MISCELLANEOUS) ×2
APPLIER CLIP ROT 10 11.4 M/L (STAPLE) ×2
APR CLP MED LRG 11.4X10 (STAPLE) ×1
BAG RETRIEVAL 10 (BASKET) ×1
BLADE SURG 15 STRL LF DISP TIS (BLADE) ×1 IMPLANT
BLADE SURG 15 STRL SS (BLADE) ×2
CHLORAPREP W/TINT 26 (MISCELLANEOUS) ×2 IMPLANT
CLIP APPLIE ROT 10 11.4 M/L (STAPLE) ×1 IMPLANT
CLOTH BEACON ORANGE TIMEOUT ST (SAFETY) ×2 IMPLANT
COVER LIGHT HANDLE STERIS (MISCELLANEOUS) ×4 IMPLANT
COVER WAND RF STERILE (DRAPES) ×2 IMPLANT
DECANTER SPIKE VIAL GLASS SM (MISCELLANEOUS) ×2 IMPLANT
DERMABOND ADVANCED (GAUZE/BANDAGES/DRESSINGS) ×1
DERMABOND ADVANCED .7 DNX12 (GAUZE/BANDAGES/DRESSINGS) ×1 IMPLANT
ELECT REM PT RETURN 9FT ADLT (ELECTROSURGICAL) ×2
ELECTRODE REM PT RTRN 9FT ADLT (ELECTROSURGICAL) ×1 IMPLANT
GLOVE SURG ENC MOIS LTX SZ6.5 (GLOVE) ×2 IMPLANT
GLOVE SURG POLYISO LF SZ6.5 (GLOVE) ×2 IMPLANT
GLOVE SURG UNDER POLY LF SZ6.5 (GLOVE) ×2 IMPLANT
GLOVE SURG UNDER POLY LF SZ7 (GLOVE) ×6 IMPLANT
GOWN STRL REUS W/TWL LRG LVL3 (GOWN DISPOSABLE) ×6 IMPLANT
HEMOSTAT SNOW SURGICEL 2X4 (HEMOSTASIS) ×2 IMPLANT
INST SET LAPROSCOPIC AP (KITS) ×2 IMPLANT
KIT TURNOVER KIT A (KITS) ×2 IMPLANT
MANIFOLD NEPTUNE II (INSTRUMENTS) ×2 IMPLANT
NEEDLE INSUFFLATION 14GA 120MM (NEEDLE) ×2 IMPLANT
NS IRRIG 1000ML POUR BTL (IV SOLUTION) ×2 IMPLANT
PACK LAP CHOLE LZT030E (CUSTOM PROCEDURE TRAY) ×2 IMPLANT
PAD ARMBOARD 7.5X6 YLW CONV (MISCELLANEOUS) ×2 IMPLANT
SET BASIN LINEN APH (SET/KITS/TRAYS/PACK) ×2 IMPLANT
SET TUBE SMOKE EVAC HIGH FLOW (TUBING) ×2 IMPLANT
SLEEVE ENDOPATH XCEL 5M (ENDOMECHANICALS) ×2 IMPLANT
SUT MNCRL AB 4-0 PS2 18 (SUTURE) ×4 IMPLANT
SUT VICRYL 0 UR6 27IN ABS (SUTURE) ×2 IMPLANT
SYS BAG RETRIEVAL 10MM (BASKET) ×1
SYSTEM BAG RETRIEVAL 10MM (BASKET) ×1 IMPLANT
TROCAR ENDO BLADELESS 11MM (ENDOMECHANICALS) ×2 IMPLANT
TROCAR XCEL NON-BLD 5MMX100MML (ENDOMECHANICALS) ×2 IMPLANT
TROCAR XCEL UNIV SLVE 11M 100M (ENDOMECHANICALS) ×2 IMPLANT
TUBE CONNECTING 12X1/4 (SUCTIONS) ×2 IMPLANT
WARMER LAPAROSCOPE (MISCELLANEOUS) ×2 IMPLANT

## 2020-05-15 NOTE — Interval H&P Note (Signed)
History and Physical Interval Note:  05/15/2020 10:14 AM  Janet [name]  has presented today for surgery, with the diagnosis of Cholelithiasis.  The various methods of treatment have been discussed with the patient and family. After consideration of risks, benefits and other options for treatment, the patient has consented to  Procedure(s): LAPAROSCOPIC CHOLECYSTECTOMY (N/A) as a surgical intervention.  The patient's history has been reviewed, patient examined, no change in status, stable for surgery.  I have reviewed the patient's chart and labs.  Questions were answered to the patient's satisfaction.    Negative EGD.  Lucretia Roers

## 2020-05-15 NOTE — Op Note (Signed)
Operative Note   Preoperative Diagnosis: Symptomatic cholelithiasis   Postoperative Diagnosis: Same   Procedure(s) Performed: Laparoscopic cholecystectomy   Surgeon: Lillia Abed C. Henreitta Leber, MD   Assistants: Franky Macho, MD   Anesthesia: General endotracheal   Anesthesiologist: Molli Barrows, MD    Specimens: Gallbladder    Estimated Blood Loss: Minimal    Blood Replacement: None    Complications: None    Operative Findings:  Normal appearing gallbladder    Procedure: The patient was taken to the operating room and placed supine. General endotracheal anesthesia was induced. Intravenous antibiotics were administered per protocol. An orogastric tube positioned to decompress the stomach. The abdomen was prepared and draped in the usual sterile fashion.    A supraumbilical incision was made and a Veress technique was utilized to achieve pneumoperitoneum to 15 mmHg with carbon dioxide. A 11 mm optiview port was placed through the supraumbilical region, and a 10 mm 0-degree operative laparoscope was introduced. The area underlying the trocar and Veress needle were inspected and without evidence of injury.  Remaining trocars were placed under direct vision. Two 5 mm ports were placed in the right abdomen, between the anterior axillary and midclavicular line.  A final 11 mm port was placed through the mid-epigastrium, near the falciform ligament.    The gallbladder fundus was elevated cephalad and the infundibulum was retracted to the patient's right. The gallbladder/cystic duct junction was skeletonized. The cystic artery noted in the triangle of Calot and was also skeletonized.  We then continued liberal medial and lateral dissection until the critical view of safety was achieved.    The cystic duct was triply clipped and and cystic artery was doubly clipped and both were divided. The gallbladder was then dissected from the liver bed with electrocautery. The specimen was placed in an  Endopouch and was retrieved through the epigastric site.   Final inspection revealed acceptable hemostasis. Surgical SNOW was placed in the gallbladder bed.  Trocars were removed and pneumoperitoneum was released. The epigastric and umbilical port sites were smaller than the tip of my finger.  Skin incisions were closed with 4-0 Monocryl subcuticular sutures and Dermabond. The patient was awakened from anesthesia and extubated without complication.    Algis Greenhouse, MD [facility] [address] [place], [place] [address] [phone] (office)

## 2020-05-15 NOTE — Progress Notes (Signed)
[facility]  Updated mother. Rx sent in. Will do post op phone call 5/24, she can return to work 5/16 unless wants to be out longer and can start lifting more than 15 lbs after 5/23. She can notify the office if she needs changes for work.   Algis Greenhouse, MD [facility] [address] [place], [place] [address] [phone] (office)

## 2020-05-15 NOTE — Discharge Instructions (Signed)
Discharge Laparoscopic Surgery Instructions:  Of note it is difficult to get IV access on you. You will want to make sure people are aware for future procedures.   Common Complaints: Right shoulder pain is common after laparoscopic surgery. This is secondary to the gas used in the surgery being trapped under the diaphragm.  Walk to help your body absorb the gas. This will improve in a few days. Pain at the port sites are common, especially the larger port sites. This will improve with time.  Some nausea is common and poor appetite. The main goal is to stay hydrated the first few days after surgery.   Diet/ Activity: Diet as tolerated. You may not have an appetite, but it is important to stay hydrated. Drink 64 ounces of water a day. Your appetite will return with time.  Shower per your regular routine daily.  Do not take hot showers. Take warm showers that are less than 10 minutes. Rest and listen to your body, but do not remain in bed all day.  Walk everyday for at least 15-20 minutes. Deep cough and move around every 1-2 hours in the first few days after surgery.  Do not lift > 10 lbs, perform excessive bending, pushing, pulling, squatting for 1-2 weeks after surgery.  Do not pick at the dermabond glue on your incision sites.  This glue film will remain in place for 1-2 weeks and will start to peel off.  Do not place lotions or balms on your incision unless instructed to specifically by Dr. Henreitta Leber.   Pain Expectations and Narcotics: -After surgery you will have pain associated with your incisions and this is normal. The pain is muscular and nerve pain, and will get better with time. -You are encouraged and expected to take non narcotic medications like tylenol and ibuprofen (when able) to treat pain as multiple modalities can aid with pain treatment. -Narcotics are only used when pain is severe or there is breakthrough pain. -You are not expected to have a pain score of 0 after surgery, as  we cannot prevent pain. A pain score of 3-4 that allows you to be functional, move, walk, and tolerate some activity is the goal. The pain will continue to improve over the days after surgery and is dependent on your surgery. -Due to [place] law, we are only able to give a certain amount of pain medication to treat post operative pain, and we only give additional narcotics on a patient by patient basis.  -For most laparoscopic surgery, studies have shown that the majority of patients only need 10-15 narcotic pills, and for open surgeries most patients only need 15-20.   -Having appropriate expectations of pain and knowledge of pain management with non narcotics is important as we do not want anyone to become addicted to narcotic pain medication.  -Using ice packs in the first 48 hours and heating pads after 48 hours, wearing an abdominal binder (when recommended), and using over the counter medications are all ways to help with pain management.   -Simple acts like meditation and mindfulness practices after surgery can also help with pain control and research has proven the benefit of these practices.  Medication: Take tylenol and ibuprofen as needed for pain control, alternating every 4-6 hours.  Example:  Tylenol  @ 6am, 12noon, 6pm, (Do not exceed  of tylenol a day). Ibuprofen  @ 9am, 3pm, 9pm, 3am (Do not exceed  of ibuprofen a day).  Take Roxicodone for breakthrough pain every  4 hours.  Take Colace for constipation related to narcotic pain medication. If you do not have a bowel movement in 2 days, take Miralax over the counter.  Drink plenty of water to also prevent constipation.   Contact Information: If you have questions or concerns, please call our office, [phone], Monday- Thursday 8AM-5PM and Friday 8AM-12Noon.  If it is after hours or on the weekend, please call [facility], [phone], [phone], and ask to speak to the surgeon on call for Dr.  Henreitta Leber at [facility].    Minimally Invasive Cholecystectomy, Care After This sheet gives you information about how to care for yourself after your procedure. Your doctor may also give you more specific instructions. If you have problems or questions, contact your doctor. What can I expect after the procedure? After the procedure, it is common:  To have pain at the areas of surgery. You will be given medicines for pain.  To vomit or feel like you may vomit.  To feel fullness in the belly (bloating) or to have pain in the shoulder. This comes from the gas that was used during the surgery. Follow these instructions at home: Medicines  Take over-the-counter and prescription medicines only as told by your doctor.  If you were prescribed an antibiotic medicine, take it as told by your doctor. Do not stop using the antibiotic even if you start to feel better.  Ask your doctor if the medicine prescribed to you: ? Requires you to avoid driving or using machinery. ? Can cause trouble pooping (constipation). You may need to take these actions to prevent or treat trouble pooping:  Drink enough fluid to keep your pee (urine) pale yellow.  Take over-the-counter or prescription medicines.  Eat foods that are high in fiber. These include beans, whole grains, and fresh fruits and vegetables.  Limit foods that are high in fat and sugar. These include fried or sweet foods. Incision care  Follow instructions from your doctor about how to take care of your cuts from surgery (incisions). Make sure you: ? Wash your hands with soap and water for at least 20 seconds before and after you change your bandage (dressing). If you cannot use soap and water, use hand sanitizer. ? Change your bandage as told by your doctor. ? Leave stitches (sutures), skin glue, or skin tape (adhesive) strips in place. They may need to stay in place for 2 weeks or longer. If tape strips get loose and curl up, you may trim the  loose edges. Do not remove tape strips completely unless your doctor says it is okay.  Do not take baths, swim, or use a hot tub until your doctor approves. You may shower.  Check your surgery area every day for signs of infection. Check for: ? More redness, swelling, or pain. ? Fluid or blood. ? Warmth. ? Pus or a bad smell.   Activity  Rest as told by your doctor.  Do not sit for a long time without moving. Get up to take short walks every 1-2 hours. This is important. Ask for help if you feel weak or unsteady.  Do not lift anything that is heavier than 10 lb (4.5 kg), or the limit that you are told, until your doctor says that it is safe.  Do not play contact sports until your doctor says it is okay.  Do not return to work or school until your doctor says it is okay.  Return to your normal activities as told by  your doctor. Ask your doctor what activities are safe for you. General instructions  If you were given a medicine to help you relax (sedative) during your procedure, it can affect you for many hours. Do not drive or use machinery until your doctor says that it is safe.  Keep all follow-up visits as told by your doctor. This is important. Contact a doctor if:  You get a rash.  You have more redness, swelling, or pain around your cuts from surgery.  You have fluid or blood coming from your cuts from surgery.  Your cuts from surgery feel warm to the touch.  You have pus or a bad smell coming from your cuts from surgery.  You have a fever.  One or more of your cuts from surgery breaks open. Get help right away if:  You have trouble breathing.  You have chest pain.  You have pain that is getting worse in your shoulders.  You faint or feel dizzy when you stand.  You have very bad pain in your belly (abdomen).  You feel like you may vomit or you vomit, and this lasts for more than one day.  You have leg pain. Summary  After your surgery, it is common to  have pain at the areas of surgery. You may also have vomiting or fullness in the belly.  Follow your doctor's instructions about medicine, activity restrictions, and caring for your surgery areas. Do not do activities that require a lot of effort.  Contact a doctor if you have a fever or other signs of infection, such as more redness, swelling, or pain around the cuts from surgery.  Get help right away if you have chest pain, increasing pain in the shoulders, or trouble breathing. This information is not intended to replace advice given to you by your health care provider. Make sure you discuss any questions you have with your health care provider. Document Revised: 12/08/2018 Document Reviewed: 12/08/2018 Elsevier Patient Education  2021 Elsevier Inc.   PLEASE REMOVE THE SCOPOLAMINE PATCH BEHIND YOUR RIGHT EAR ON Thursday May 18, 2020. WASH HANDS AFTER REMOVAL.  THIS WILL HELP EASE NAUSEA UNTIL THURSDAY  Scopolamine skin patches What is this medicine? SCOPOLAMINE (skoe POL a meen) is used to prevent nausea and vomiting caused by motion sickness, anesthesia and surgery. This medicine may be used for other purposes; ask your health care provider or pharmacist if you have questions. COMMON BRAND NAME(S): Transderm Scop What should I tell my health care provider before I take this medicine? They need to know if you have any of these conditions:  are scheduled to have a gastric secretion test  glaucoma  heart disease  kidney disease  liver disease  lung or breathing disease, like asthma  mental illness  prostate disease  seizures  stomach or intestine problems  trouble passing urine  an unusual or allergic reaction to scopolamine, atropine, other medicines, foods, dyes, or preservatives  pregnant or trying to get pregnant  breast-feeding How should I use this medicine? This medicine is for external use only. Follow the directions on the prescription label. Wear only 1  patch at a time. Choose an area behind the ear, that is clean, dry, hairless and free from any cuts or irritation. Wipe the area with a clean dry tissue. Peel off the plastic backing of the skin patch, trying not to touch the adhesive side with your hands. Do not cut the patches. Firmly apply to the area you have chosen, with  the metallic side of the patch to the skin and the tan-colored side showing. Once firmly in place, wash your hands well with soap and water. Do not get this medicine into your eyes. After removing the patch, wash your hands and the area behind your ear thoroughly with soap and water. The patch will still contain some medicine after use. To avoid accidental contact or ingestion by children or pets, fold the used patch in half with the sticky side together and throw away in the trash out of the reach of children and pets. If you need to use a second patch after you remove the first, place it behind the other ear. A special MedGuide will be given to you by the pharmacist with each prescription and refill. Be sure to read this information carefully each time. Talk to your pediatrician regarding the use of this medicine in children. Special care may be needed. Overdosage: If you think you have taken too much of this medicine contact a poison control center or emergency room at once. NOTE: This medicine is only for you. Do not share this medicine with others. What if I miss a dose? This does not apply. This medicine is not for regular use. What may interact with this medicine?  alcohol  antihistamines for allergy cough and cold  atropine  certain medicines for anxiety or sleep  certain medicines for bladder problems like oxybutynin, tolterodine  certain medicines for depression like amitriptyline, fluoxetine, sertraline  certain medicines for stomach problems like dicyclomine, hyoscyamine  certain medicines for Parkinson's disease like benztropine, trihexyphenidyl  certain  medicines for seizures like phenobarbital, primidone  general anesthetics like halothane, isoflurane, methoxyflurane, propofol  ipratropium  local anesthetics like lidocaine, pramoxine, tetracaine  medicines that relax muscles for surgery  phenothiazines like chlorpromazine, mesoridazine, prochlorperazine, thioridazine  narcotic medicines for pain  other belladonna alkaloids This list may not describe all possible interactions. Give your health care provider a list of all the medicines, herbs, non-prescription drugs, or dietary supplements you use. Also tell them if you smoke, drink alcohol, or use illegal drugs. Some items may interact with your medicine. What should I watch for while using this medicine? Limit contact with water while swimming and bathing because the patch may fall off. If the patch falls off, throw it away and put a new one behind the other ear. You may get drowsy or dizzy. Do not drive, use machinery, or do anything that needs mental alertness until you know how this medicine affects you. Do not stand or sit up quickly, especially if you are an older patient. This reduces the risk of dizzy or fainting spells. Alcohol may interfere with the effect of this medicine. Avoid alcoholic drinks. Your mouth may get dry. Chewing sugarless gum or sucking hard candy, and drinking plenty of water may help. Contact your healthcare professional if the problem does not go away or is severe. This medicine may cause dry eyes and blurred vision. If you wear contact lenses, you may feel some discomfort. Lubricating drops may help. See your healthcare professional if the problem does not go away or is severe. If you are going to need surgery, an MRI, CT scan, or other procedure, tell your healthcare professional that you are using this medicine. You may need to remove the patch before the procedure. What side effects may I notice from receiving this medicine? Side effects that you should  report to your doctor or health care professional as soon as possible:  allergic  reactions like skin rash, itching or hives; swelling of the face, lips, or tongue  blurred vision  changes in vision  confusion  dizziness  eye pain  fast, irregular heartbeat  hallucinations, loss of contact with reality  nausea, vomiting  pain or trouble passing urine  restlessness  seizures  skin irritation  stomach pain Side effects that usually do not require medical attention (report to your doctor or health care professional if they continue or are bothersome):  drowsiness  dry mouth  headache  sore throat This list may not describe all possible side effects. Call your doctor for medical advice about side effects. You may report side effects to FDA at 1-800-FDA-1088. Where should I keep my medicine? Keep out of the reach of children. Store at room temperature between 20 and 25 degrees C (68 and 77 degrees F). Keep this medicine in the foil package until ready to use. Throw away any unused medicine after the expiration date. NOTE: This sheet is a summary. It may not cover all possible information. If you have questions about this medicine, talk to your doctor, pharmacist, or health care provider.  2021 Elsevier/Gold Standard (2017-03-14 16:14:46)     General Anesthesia, Adult, Care After This sheet gives you information about how to care for yourself after your procedure. Your health care provider may also give you more specific instructions. If you have problems or questions, contact your health care provider. What can I expect after the procedure? After the procedure, the following side effects are common:  Pain or discomfort at the IV site.  Nausea.  Vomiting.  Sore throat.  Trouble concentrating.  Feeling cold or chills.  Feeling weak or tired.  Sleepiness and fatigue.  Soreness and body aches. These side effects can affect parts of the body that were not  involved in surgery. Follow these instructions at home: For the time period you were told by your health care provider:  Rest.  Do not participate in activities where you could fall or become injured.  Do not drive or use machinery.  Do not drink alcohol.  Do not take sleeping pills or medicines that cause drowsiness.  Do not make important decisions or sign legal documents.  Do not take care of children on your own.   Eating and drinking  Follow any instructions from your health care provider about eating or drinking restrictions.  When you feel hungry, start by eating small amounts of foods that are soft and easy to digest (bland), such as toast. Gradually return to your regular diet.  Drink enough fluid to keep your urine pale yellow.  If you vomit, rehydrate by drinking water, juice, or clear broth. General instructions  If you have sleep apnea, surgery and certain medicines can increase your risk for breathing problems. Follow instructions from your health care provider about wearing your sleep device: ? Anytime you are sleeping, including during daytime naps. ? While taking prescription pain medicines, sleeping medicines, or medicines that make you drowsy.  Have a responsible adult stay with you for the time you are told. It is important to have someone help care for you until you are awake and alert.  Return to your normal activities as told by your health care provider. Ask your health care provider what activities are safe for you.  Take over-the-counter and prescription medicines only as told by your health care provider.  If you smoke, do not smoke without supervision.  Keep all follow-up visits as told by your  health care provider. This is important. Contact a health care provider if:  You have nausea or vomiting that does not get better with medicine.  You cannot eat or drink without vomiting.  You have pain that does not get better with medicine.  You  are unable to pass urine.  You develop a skin rash.  You have a fever.  You have redness around your IV site that gets worse. Get help right away if:  You have difficulty breathing.  You have chest pain.  You have blood in your urine or stool, or you vomit blood. Summary  After the procedure, it is common to have a sore throat or nausea. It is also common to feel tired.  Have a responsible adult stay with you for the time you are told. It is important to have someone help care for you until you are awake and alert.  When you feel hungry, start by eating small amounts of foods that are soft and easy to digest (bland), such as toast. Gradually return to your regular diet.  Drink enough fluid to keep your urine pale yellow.  Return to your normal activities as told by your health care provider. Ask your health care provider what activities are safe for you. This information is not intended to replace advice given to you by your health care provider. Make sure you discuss any questions you have with your health care provider. Document Revised: 09/09/2019 Document Reviewed: 04/08/2019 Elsevier Patient Education  2021 Elsevier Inc.   Oxycodone Capsules or Tablets What is this medicine? OXYCODONE (ox i KOE done) is a pain reliever, also called an opioid. It treats severe pain. This medicine may be used for other purposes; ask your health care provider or pharmacist if you have questions. COMMON BRAND NAME(S): Dazidox, Endocodone, Oxaydo, OXECTA, OxyIR, Percolone, Roxicodone, Roxybond What should I tell my health care provider before I take this medicine? They need to know if you have any of these conditions:  brain tumor  drug abuse or addiction  head injury  heart disease  if you often drink alcohol  kidney disease  liver disease  low adrenal gland function  lung disease, asthma, or breathing problem  seizures  stomach or intestine problems  taken an MAOI such  as Marplan, Nardil, or Parnate in the last 14 days  an unusual or allergic reaction to oxycodone, other drugs, foods, dyes, or preservatives  pregnant or trying to get pregnant  breast-feeding How should I use this medicine? Take this medicine by mouth with water. Take it as directed on the prescription label at the same time every day. You can take it with or without food. If it upsets your stomach, take it with food. Keep taking it unless your health care provider tells you to stop. Some brands of this medicine, like Oxaydo, have special instructions. Ask your doctor or pharmacist if these directions are for you: Do not cut, crush or chew this medicine. Do not wet, soak, or lick the tablet before you take it. A special MedGuide will be given to you by the pharmacist with each prescription and refill. Be sure to read this information carefully each time. Talk to your pediatrician regarding the use of this medicine in children. Special care may be needed. Overdosage: If you think you have taken too much of this medicine contact a poison control center or emergency room at once. NOTE: This medicine is only for you. Do not share this medicine with others. What  if I miss a dose? If you miss a dose, take it as soon as you can. If it is almost time for your next dose, take only that dose. Do not take double or extra doses. What may interact with this medicine? Do not take this medicine with any of the following medications:  safinamide This medicine may interact with the following medications:  alcohol  antihistamines for allergy, cough, and cold  atropine  certain antivirals for HIV or hepatitis  certain antibiotics like clarithromycin, erythromycin, linezolid, rifampin  certain medicines for anxiety or sleep  certain medicines for bladder problems like oxybutynin, tolterodine  certain medicines for depression like amitriptyline, fluoxetine, sertraline  certain medicines for fungal  infections like ketoconazole, itraconazole, posaconazole  certain medicines for migraine headache like almotriptan, eletriptan, frovatriptan, naratriptan, rizatriptan, sumatriptan, zolmitriptan  certain medicines for nausea or vomiting like dolasetron, granisetron, ondansetron, palonosetron  certain medicines for Parkinson's disease like benztropine, trihexyphenidyl  certain medicines for seizures like carbamazepine, phenobarbital, phenytoin, primidone  certain medicines for stomach problems like dicyclomine, hyoscyamine  certain medicines for travel sickness like scopolamine  diuretics  general anesthetics like halothane, isoflurane, methoxyflurane, propofol  ipratropium  MAOIs like Marplan, Nardil, and Parnate  medicines that relax muscles  methylene blue  other narcotic medicines for pain or cough  phenothiazines like chlorpromazine, mesoridazine, prochlorperazine, thioridazine This list may not describe all possible interactions. Give your health care provider a list of all the medicines, herbs, non-prescription drugs, or dietary supplements you use. Also tell them if you smoke, drink alcohol, or use illegal drugs. Some items may interact with your medicine. What should I watch for while using this medicine? Tell your health care provider if your pain does not go away, if it gets worse, or if you have new or a different type of pain. You may develop tolerance to this medicine. Tolerance means that you will need a higher dose of the medicine for pain relief. Tolerance is normal and is expected if you take this medicine for a long time. Do not suddenly stop taking your medicine because you may develop a severe reaction. Your body becomes used to the medicine. This does NOT mean you are addicted. Addiction is a behavior related to getting and using a medicine for a nonmedical reason. If you have pain, you have a medical reason to take pain medicine. Your health care provider will  tell you how much medicine to take. If your health care provider wants you to stop the medicine, the dose will be slowly lowered over time to avoid any side effects. If you take other medicines that also cause drowsiness such as other narcotic pain medicines, benzodiazepines, or other medicines for sleep, you may have more side effects. Give your health care provider a list of all medicines you use. He or she will tell you how much medicine to take. Do not take more medicine than directed. Get emergency help right away if you have trouble breathing or are unusually tired or sleepy. Talk to your health care provider about naloxone and how to get it. Naloxone is an emergency medicine used for an opioid overdose. An overdose can happen if you take too much opioid. It can also happen if an opioid is taken with some other medicines or substances, such as alcohol. Know the symptoms of an overdose, such as trouble breathing, unusually tired or sleepy, or not being able to respond or wake up. Make sure to tell caregivers and close contacts where it is  stored. Make sure they know how to use it. After naloxone is given, you must get emergency help right away. Naloxone is a temporary treatment. Repeat doses may be needed. You may get drowsy or dizzy. Do not drive, use machinery, or do anything that needs mental alertness until you know how this medicine affects you. Do not stand up or sit up quickly, especially if you are an older patient. This reduces the risk of dizzy or fainting spells. Alcohol may interfere with the effect of this medicine. Avoid alcoholic drinks. This medicine will cause constipation. If you do not have a bowel movement for 3 days, call your health care provider. Your mouth may get dry. Chewing sugarless gum or sucking hard candy and drinking plenty of water may help. Contact your health care provider if the problem does not go away or is severe. What side effects may I notice from receiving this  medicine? Side effects that you should report to your doctor or health care professional as soon as possible:  allergic reactions (skin rash, itching or hives; swelling of the face, lips, or tongue)  confusion  kidney injury (trouble passing urine or change in the amount of urine)  low adrenal gland function (nausea; vomiting; loss of appetite; unusually weak or tired; dizziness; low blood pressure)  low blood pressure (dizziness; feeling faint or lightheaded, falls; unusually weak or tired)  serotonin syndrome (irritable; confusion; diarrhea; fast or irregular heartbeat; muscle twitching; stiff muscles; trouble walking; sweating; high fever; seizures; chills; vomiting)  trouble breathing Side effects that usually do not require medical attention (report to your doctor or health care professional if they continue or are bothersome):  constipation  dry mouth  nausea, vomiting  tiredness This list may not describe all possible side effects. Call your doctor for medical advice about side effects. You may report side effects to FDA at 1-800-FDA-1088. Where should I keep my medicine? Keep out of the reach of children and pets. This medicine can be abused. Keep it in a safe place to protect it from theft. Do not share it with anyone. It is only for you. Selling or giving away this medicine is dangerous and against the law. Store at [employer] degrees C (77 degrees F). Protect from light and moisture. Keep the container tightly closed. Get rid of any unused medicine after the expiration date. This medicine may cause harm and death if it is taken by other adults, children, or pets. It is important to get rid of the medicine as soon as you no longer need it or it is expired. You can do this in two ways:  Take the medicine to a medicine take-back program. Check with your pharmacy or law enforcement to find a location.  If you cannot return the medicine, flush it down the toilet. NOTE: This sheet is a  summary. It may not cover all possible information. If you have questions about this medicine, talk to your doctor, pharmacist, or health care provider.  2021 Elsevier/Gold Standard (2019-11-15 13:26:06)

## 2020-05-15 NOTE — Anesthesia Procedure Notes (Signed)
Procedure Name: Intubation Date/Time: 05/15/2020 11:01 AM Performed by: Hewitt Blade, CRNA Pre-anesthesia Checklist: Patient identified, Emergency Drugs available, Suction available and Patient being monitored Patient Re-evaluated:Patient Re-evaluated prior to induction Oxygen Delivery Method: Circle system utilized Preoxygenation: Pre-oxygenation with 100% oxygen Induction Type: IV induction and Inhalational induction Ventilation: Mask ventilation without difficulty Laryngoscope Size: Mac and 3 Grade View: Grade I Tube type: Oral Tube size: 7.0 mm Number of attempts: 1 Airway Equipment and Method: Stylet Placement Confirmation: ETT inserted through vocal cords under direct vision,  positive ETCO2 and breath sounds checked- equal and bilateral Secured at: 21 cm Tube secured with: Tape Dental Injury: Teeth and Oropharynx as per pre-operative assessment

## 2020-05-15 NOTE — Anesthesia Postprocedure Evaluation (Signed)
Anesthesia Post Note  Patient: Janet [name]  Procedure(s) Performed: LAPAROSCOPIC CHOLECYSTECTOMY (N/A Abdomen)  Patient location during evaluation: PACU Anesthesia Type: General Level of consciousness: awake and alert and oriented Pain management: pain level controlled Vital Signs Assessment: post-procedure vital signs reviewed and stable Respiratory status: spontaneous breathing and respiratory function stable Cardiovascular status: blood pressure returned to baseline and stable Postop Assessment: no apparent nausea or vomiting Anesthetic complications: no   No complications documented.   Last Vitals:  Vitals:   05/15/20 1301 05/15/20 1311  BP: 133/75 128/85  Pulse: (!) 58 68  Resp: 18 18  Temp:  36.5 C  SpO2: 97% 98%    Last Pain:  Vitals:   05/15/20 1311  TempSrc: Oral  PainSc: 4                  Chardonnay Holzmann C Vianey Caniglia

## 2020-05-15 NOTE — Anesthesia Preprocedure Evaluation (Signed)
Anesthesia Evaluation  Patient identified by MRN, date of birth, ID band Patient awake    Reviewed: Allergy & Precautions, NPO status , Patient's Chart, lab work & pertinent test results  History of Anesthesia Complications Negative for: history of anesthetic complications  Airway Mallampati: II  TM Distance: >3 FB Neck ROM: Full    Dental  (+) Dental Advisory Given, Teeth Intact   Pulmonary sleep apnea and Continuous Positive Airway Pressure Ventilation , Current Smoker and Patient abstained from smoking.,    Pulmonary exam normal breath sounds clear to auscultation       Cardiovascular Exercise Tolerance: Good Normal cardiovascular exam+ Valvular Problems/Murmurs  Rhythm:Regular Rate:Normal     Neuro/Psych  Headaches,  Neuromuscular disease    GI/Hepatic GERD  Medicated,  Endo/Other  Hypothyroidism Morbid obesity  Renal/GU Renal InsufficiencyRenal disease     Musculoskeletal negative musculoskeletal ROS (+)   Abdominal   Peds  Hematology  (+) anemia ,   Anesthesia Other Findings   Reproductive/Obstetrics negative OB ROS                             Anesthesia Physical  Anesthesia Plan  ASA: III  Anesthesia Plan: General   Post-op Pain Management:    Induction: Intravenous  PONV Risk Score and Plan: 4 or greater and Ondansetron, Dexamethasone, Midazolam and Scopolamine patch - Pre-op  Airway Management Planned: Oral ETT  Additional Equipment:   Intra-op Plan:   Post-operative Plan: Extubation in OR  Informed Consent: I have reviewed the patients History and Physical, chart, labs and discussed the procedure including the risks, benefits and alternatives for the proposed anesthesia with the patient or authorized representative who has indicated his/her understanding and acceptance.     Dental advisory given  Plan Discussed with: CRNA and Surgeon  Anesthesia Plan  Comments:         Anesthesia Quick Evaluation

## 2020-05-15 NOTE — Transfer of Care (Signed)
Immediate Anesthesia Transfer of Care Note  Patient: Janet [name]  Procedure(s) Performed: LAPAROSCOPIC CHOLECYSTECTOMY (N/A Abdomen)  Patient Location: PACU  Anesthesia Type:General  Level of Consciousness: awake  Airway & Oxygen Therapy: Patient Spontanous Breathing and Patient connected to face mask oxygen  Post-op Assessment: Report given to RN and Post -op Vital signs reviewed and stable  Post vital signs: Reviewed and stable  Last Vitals:  Vitals Value Taken Time  BP 129/72 05/15/20 1200  Temp 36.8 C 05/15/20 1154  Pulse 64 05/15/20 1202  Resp 32 05/15/20 1202  SpO2 100 % 05/15/20 1202  Vitals shown include unvalidated device data.  Last Pain:  Vitals:   05/15/20 1154  TempSrc:   PainSc: Asleep      Patients Stated Pain Goal: 6 (05/15/20 0927)  Complications: No complications documented.

## 2020-05-16 ENCOUNTER — Encounter (HOSPITAL_COMMUNITY): Payer: Self-pay | Admitting: General Surgery

## 2020-05-17 LAB — SURGICAL PATHOLOGY

## 2020-05-18 ENCOUNTER — Encounter: Payer: Self-pay | Admitting: Family Medicine

## 2020-05-29 ENCOUNTER — Telehealth: Payer: Self-pay | Admitting: Family Medicine

## 2020-05-29 NOTE — Telephone Encounter (Signed)
FMLA paperwork filled out and faxed to [employer] at [phone] and received confirmation.   Pt may return to work on 05/31/2020 without restrictions.

## 2020-05-30 ENCOUNTER — Ambulatory Visit (INDEPENDENT_AMBULATORY_CARE_PROVIDER_SITE_OTHER): Payer: 59 | Admitting: General Surgery

## 2020-05-30 ENCOUNTER — Encounter: Payer: Self-pay | Admitting: Family Medicine

## 2020-05-30 ENCOUNTER — Encounter: Payer: 59 | Admitting: General Surgery

## 2020-05-30 DIAGNOSIS — R1011 Right upper quadrant pain: Secondary | ICD-10-CM

## 2020-05-30 DIAGNOSIS — G8929 Other chronic pain: Secondary | ICD-10-CM

## 2020-05-30 DIAGNOSIS — K8020 Calculus of gallbladder without cholecystitis without obstruction: Secondary | ICD-10-CM

## 2020-05-30 MED ORDER — METOCLOPRAMIDE HCL 5 MG PO TABS: 5.0000 mg | ORAL_TABLET | Freq: Three times a day (TID) | ORAL | 1 refills | Status: DC

## 2020-05-30 NOTE — Progress Notes (Signed)
[facility]  I am calling the patient for post operative evaluation. This is not a billable encounter as it is under the global charges for the surgery.  The patient had a laparoscopic cholecystectomy on 5/9. The patient reports that her pain is better but is very nauseated and is vomiting on occasion. She had this before surgery. ? Possibly could have some gastroparesis or stomach not emptying.   She is taking zofran and it does not help all the time.  Will send in some reglan to see if this helps. Will send Dr. Levon Hedger a message to see about gastric emptying study.   Pathology: FINAL MICROSCOPIC DIAGNOSIS:   A. GALLBLADDER, CHOLECYSTECTOMY:  - Chronic cholecystitis.  - Cholelithiasis.   Will see the patient PRN. Will check to make sure office sends notes to [employer].   Ok to return to work on 05/31/2020 without restrictions. Will get office to send a note.   Algis Greenhouse, MD [facility] [address] Vella Raring [place], [place] [address] [phone] (office)

## 2020-06-06 ENCOUNTER — Other Ambulatory Visit (INDEPENDENT_AMBULATORY_CARE_PROVIDER_SITE_OTHER): Payer: Self-pay

## 2020-06-07 ENCOUNTER — Other Ambulatory Visit (INDEPENDENT_AMBULATORY_CARE_PROVIDER_SITE_OTHER): Payer: Self-pay

## 2020-06-07 DIAGNOSIS — R112 Nausea with vomiting, unspecified: Secondary | ICD-10-CM

## 2020-06-07 NOTE — Progress Notes (Signed)
gs

## 2020-06-13 ENCOUNTER — Ambulatory Visit (HOSPITAL_COMMUNITY): Payer: 59

## 2020-06-20 ENCOUNTER — Ambulatory Visit (HOSPITAL_COMMUNITY)
Admission: RE | Admit: 2020-06-20 | Discharge: 2020-06-20 | Disposition: A | Discharge status: Home / Self Care | Payer: 59 | Source: Ambulatory Visit | Attending: Gastroenterology | Admitting: Gastroenterology

## 2020-06-20 DIAGNOSIS — R112 Nausea with vomiting, unspecified: Secondary | ICD-10-CM | POA: Insufficient documentation

## 2020-06-26 ENCOUNTER — Ambulatory Visit (HOSPITAL_COMMUNITY)
Admission: RE | Admit: 2020-06-26 | Discharge: 2020-06-26 | Disposition: A | Discharge status: Home / Self Care | Payer: 59 | Source: Ambulatory Visit | Attending: Gastroenterology | Admitting: Gastroenterology

## 2020-06-26 ENCOUNTER — Telehealth (INDEPENDENT_AMBULATORY_CARE_PROVIDER_SITE_OTHER): Payer: Self-pay | Admitting: Gastroenterology

## 2020-06-26 ENCOUNTER — Other Ambulatory Visit: Payer: Self-pay

## 2020-06-26 DIAGNOSIS — R112 Nausea with vomiting, unspecified: Secondary | ICD-10-CM | POA: Diagnosis not present

## 2020-06-26 MED ORDER — TECHNETIUM TC 99M SULFUR COLLOID
2.0000 | Freq: Once | INTRAVENOUS | Status: AC | PRN
  Administered 2020-06-26: 2.03 via ORAL

## 2020-06-26 NOTE — Telephone Encounter (Signed)
Spoke with the patient regarding the results of her gastric emptying study which were within normal limits.  She has been off Reglan for the last 5 days which makes this test accurate.  Patient can continue taking the Reglan 5 mg as needed for now and we will evaluate her symptom control while off the medication as she has had significant improvement recently.  If she has recurrence of the symptoms will prescribe Phenergan instead.  Katrinka Blazing, MD Gastroenterology and Hepatology [facility]

## 2020-07-04 ENCOUNTER — Encounter: Payer: Self-pay | Admitting: Family Medicine

## 2020-07-04 ENCOUNTER — Ambulatory Visit (INDEPENDENT_AMBULATORY_CARE_PROVIDER_SITE_OTHER): Payer: 59 | Admitting: Family Medicine

## 2020-07-04 DIAGNOSIS — A084 Viral intestinal infection, unspecified: Secondary | ICD-10-CM | POA: Diagnosis not present

## 2020-07-04 MED ORDER — ONDANSETRON HCL 4 MG PO TABS: 4.0000 mg | ORAL_TABLET | Freq: Three times a day (TID) | ORAL | 0 refills | Status: DC | PRN

## 2020-07-04 NOTE — Progress Notes (Signed)
Virtual Visit via Telephone Note  I connected with Janet [name] on 07/04/20 at 4:00 PM by telephone and verified that I am speaking with the correct person using two identifiers. Janet [name] is currently located at home and her mother is currently with her during this visit. The provider, Gwenlyn Fudge, FNP is located in their home at time of visit.  I discussed the limitations, risks, security and privacy concerns of performing an evaluation and management service by telephone and the availability of in person appointments. I also discussed with the patient that there may be a patient responsible charge related to this service. The patient expressed understanding and agreed to proceed.  Subjective: PCP: Raliegh Ip, DO  Chief Complaint  Patient presents with   GI Problem   Patient reports nausea, vomiting, and diarrhea that started 2 days ago.  She last vomited yesterday.  No blood or mucus in her stool.  She has been taking Zofran for nausea.  She is drinking plenty of Sprite, water, and lemonade.   ROS: Per HPI  Current Outpatient Medications:    acetaminophen (TYLENOL) 650 MG CR tablet, Take 650 mg by mouth every 8 (eight) hours as needed (for menstrual pain/cramps)., Disp: , Rfl:    dicyclomine (BENTYL) 10 MG capsule, Take 1 capsule (10 mg total) by mouth 2 (two) times daily before a meal., Disp: 60 capsule, Rfl: 5   ergocalciferol (VITAMIN D2) 1.25 MG (50000 UT) capsule, Take 50,000 Units by mouth once a week., Disp: , Rfl:    escitalopram (LEXAPRO) 20 MG tablet, Take 1 tablet (20 mg total) by mouth daily. (Patient taking differently: Take 20 mg by mouth at bedtime.), Disp: 90 tablet, Rfl: 3   ferrous sulfate 324 (65 Fe) MG TBEC, Take 1 tablet (324 mg total) by mouth daily. (Patient taking differently: Take 324 mg by mouth at bedtime.), Disp: 90 tablet, Rfl: 3   furosemide (LASIX) 20 MG tablet, Take 2 tablets (40 mg total) by mouth every other day. (Patient taking  differently: Take 20-40 mg by mouth daily.), Disp: 90 tablet, Rfl: 3   ibuprofen (ADVIL) 200 MG tablet, Take 400-600 mg by mouth every 6 (six) hours as needed for headache., Disp: , Rfl:    liothyronine (CYTOMEL) 5 MCG tablet, Take 5 mcg by mouth daily., Disp: , Rfl:    metoCLOPramide (REGLAN) 5 MG tablet, Take 1 tablet (5 mg total) by mouth 4 (four) times daily -  before meals and at bedtime., Disp: 120 tablet, Rfl: 1   norethindrone-ethinyl estradiol (LOESTRIN FE) 1-20 MG-MCG tablet, Take 1 tablet by mouth daily., Disp: 1 Package, Rfl: 12   omeprazole (PRILOSEC) 20 MG capsule, Take 1 capsule (20 mg total) by mouth daily. (Patient taking differently: Take 20 mg by mouth daily after lunch.), Disp: 30 capsule, Rfl: 3   ondansetron (ZOFRAN) 4 MG tablet, Take 1 tablet (4 mg total) by mouth every 8 (eight) hours as needed for nausea or vomiting., Disp: 30 tablet, Rfl: 0   oxyCODONE (ROXICODONE) 5 MG immediate release tablet, Take 1 tablet (5 mg total) by mouth every 4 (four) hours as needed., Disp: 20 tablet, Rfl: 0   TIROSINT 150 MCG CAPS, Take 300 mg by mouth every morning., Disp: , Rfl:    vitamin B-12 (CYANOCOBALAMIN) 1000 MCG tablet, Take 1,000 mcg by mouth daily., Disp: , Rfl:   No Known Allergies Past Medical History:  Diagnosis Date   Allergy    Anemia    Anxiety  Chronic kidney disease    stage 2 she says    GERD (gastroesophageal reflux disease)    Headache(784.0)    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   Hypothyroidism    Sleep apnea    Thyroid disease    Hashimotos     Observations/Objective: A&O  No respiratory distress or wheezing audible over the phone Mood, judgement, and thought processes all WNL   Assessment and Plan: 1. Viral gastroenteritis Discussed symptom management.  Advised not to stop diarrhea.  Continue Zofran as needed for nausea to stay well-hydrated.  If she is not peeing per usual she needs to go to the ER for IV fluids.  Bland diet. -  ondansetron (ZOFRAN) 4 MG tablet; Take 1 tablet (4 mg total) by mouth every 8 (eight) hours as needed for nausea or vomiting.  Dispense: 30 tablet; Refill: 0   Follow Up Instructions:  I discussed the assessment and treatment plan with the patient. The patient was provided an opportunity to ask questions and all were answered. The patient agreed with the plan and demonstrated an understanding of the instructions.   The patient was advised to call back or seek an in-person evaluation if the symptoms worsen or if the condition fails to improve as anticipated.  The above assessment and management plan was discussed with the patient. The patient verbalized understanding of and has agreed to the management plan. Patient is aware to call the clinic if symptoms persist or worsen. Patient is aware when to return to the clinic for a follow-up visit. Patient educated on when it is appropriate to go to the emergency department.   Time call ended: 4:11 PM  I provided 11 minutes of non-face-to-face time during this encounter.  Deliah Boston, MSN, APRN, FNP-C [facility] 07/04/20

## 2020-10-02 ENCOUNTER — Other Ambulatory Visit: Payer: Self-pay | Admitting: Adult Health

## 2020-11-08 IMAGING — US US ABDOMEN LIMITED
1 series · 14 of 25 positions shown · non-contrast
Comparison: Abdominal ultrasound dated 02/14/2015

CLINICAL DATA: Increased LFTs, right upper quadrant pain for
years

EXAM:
ULTRASOUND ABDOMEN LIMITED RIGHT UPPER QUADRANT

[Series 1: us abdomen limited · 0.21mm/px · 14 of 76 slices shown]
[im 1/76]
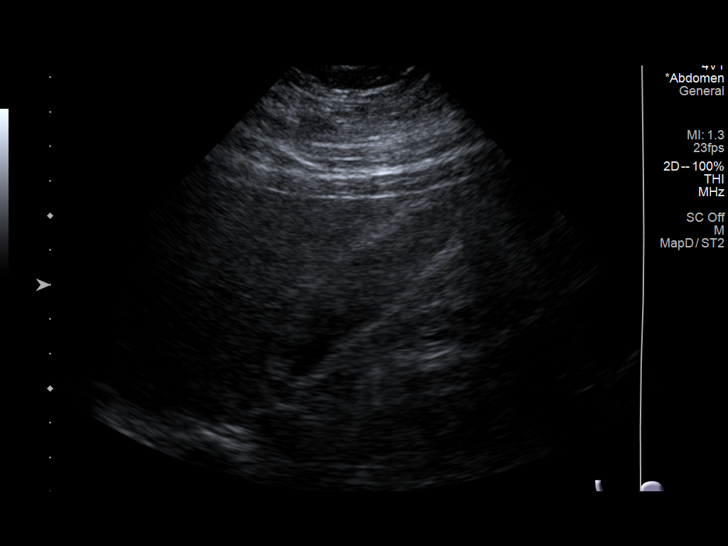
[im 7/76]
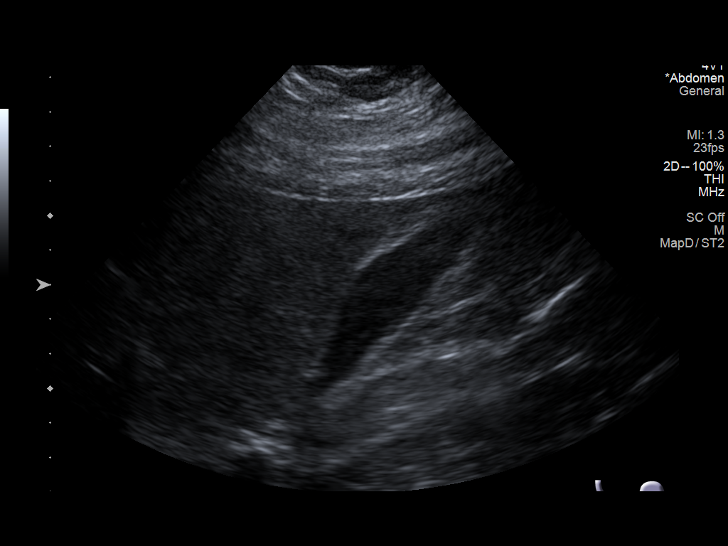
[im 13/76]
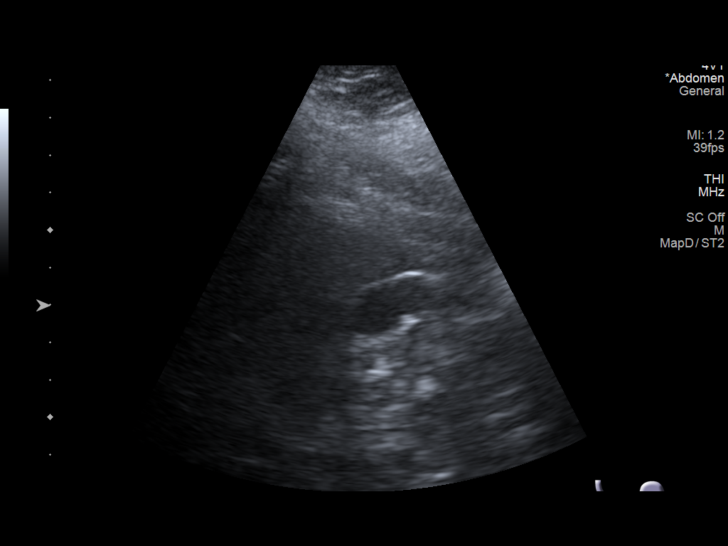
[im 19/76]
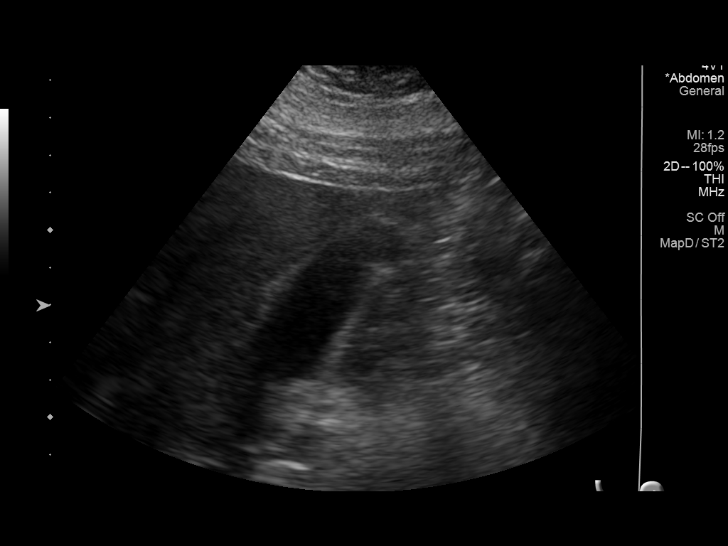
[im 26/76]
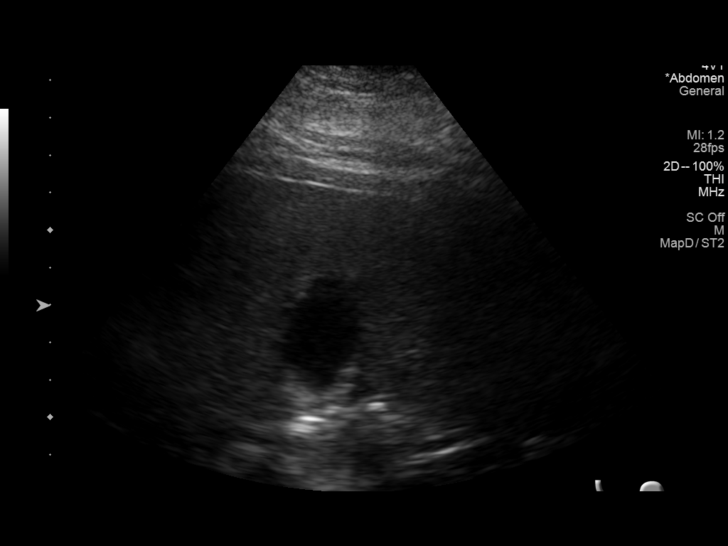
[im 29/76]
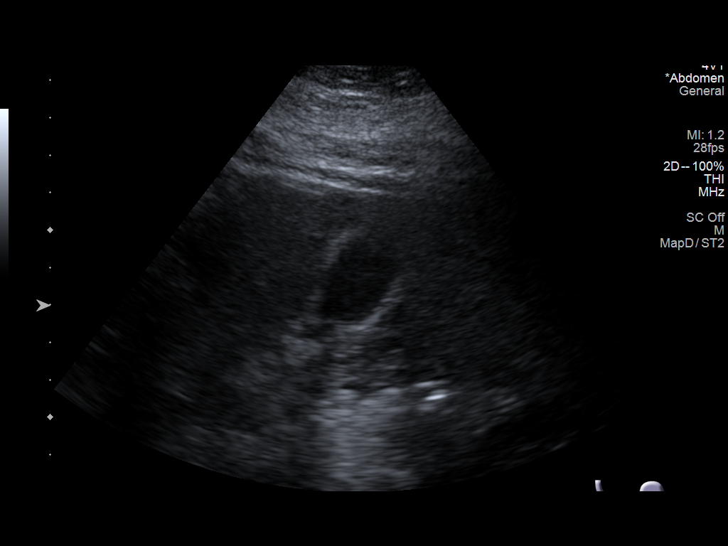
[im 35/76]
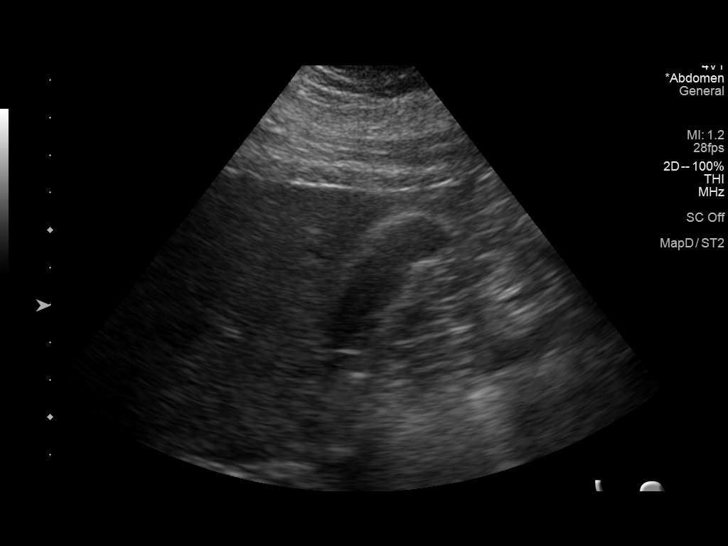
[im 41/76]
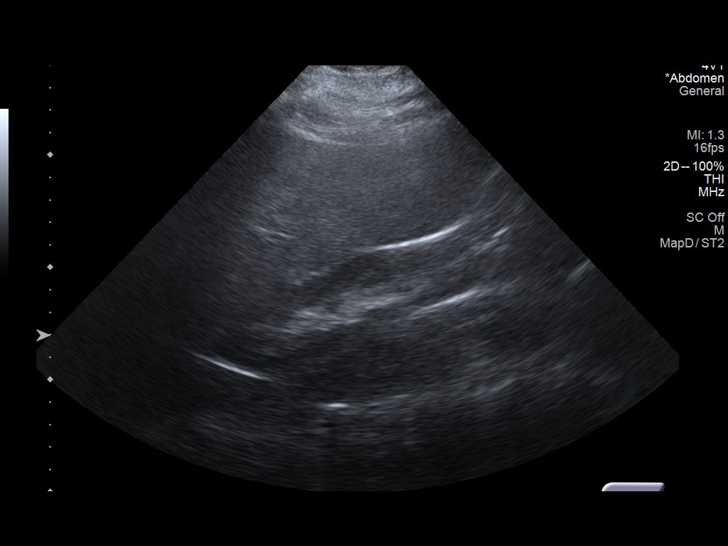
[im 47/76]
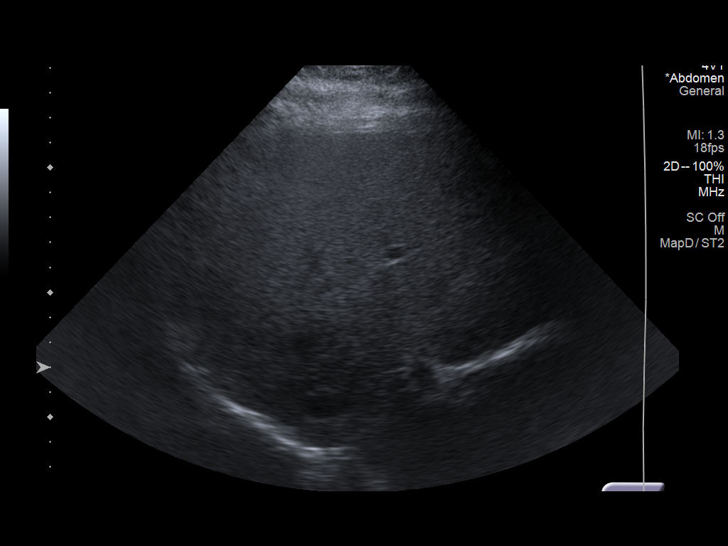
[im 51/76]
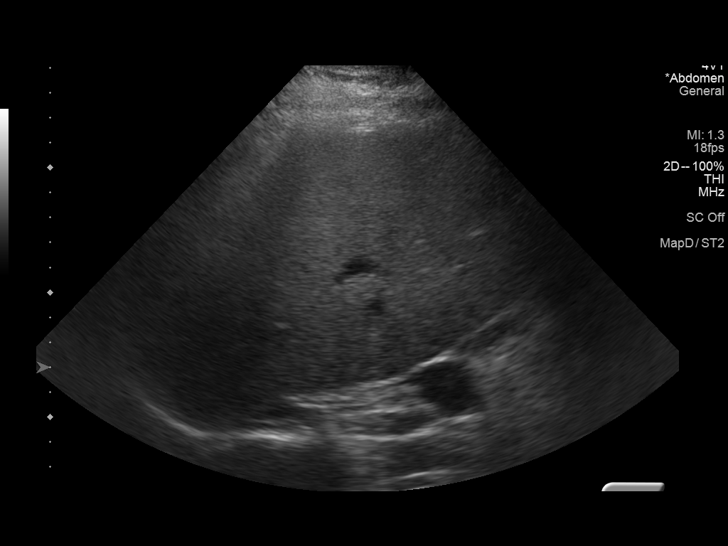
[im 57/76]
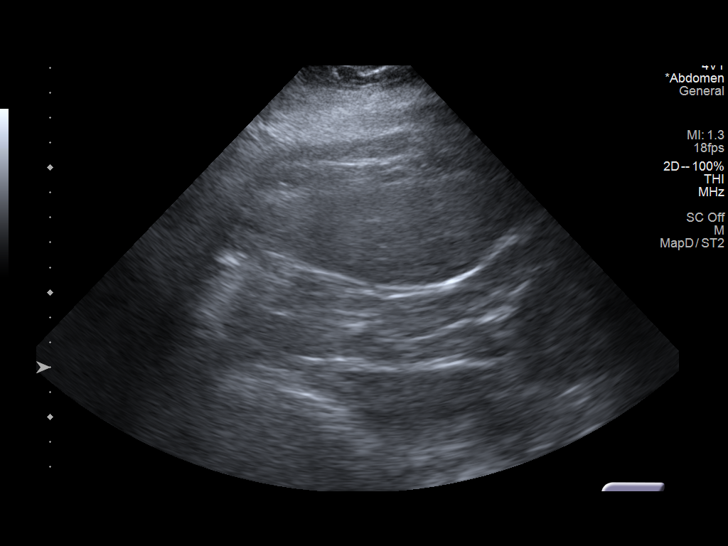
[im 63/76]
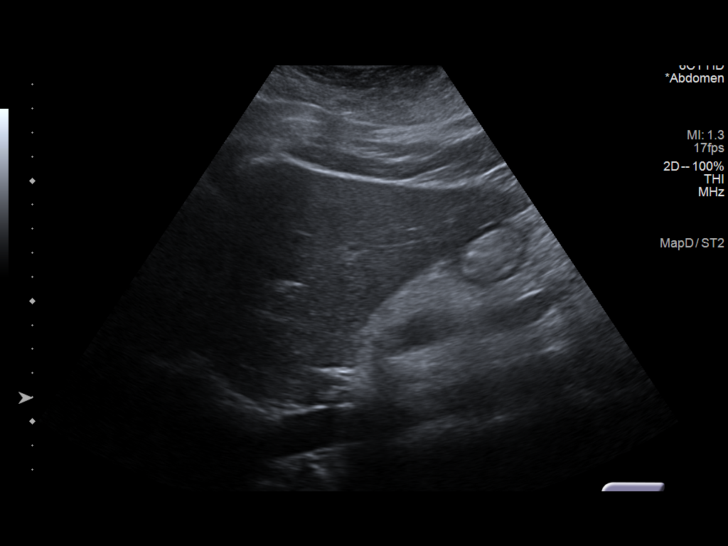
[im 69/76]
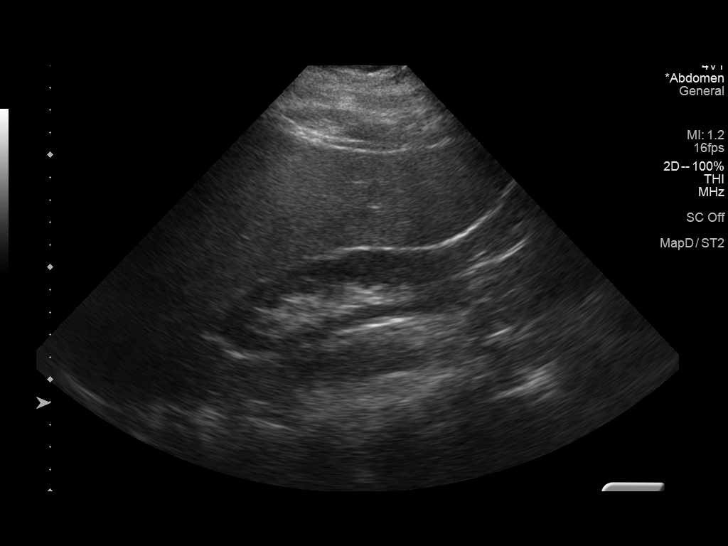
[im 76/76]
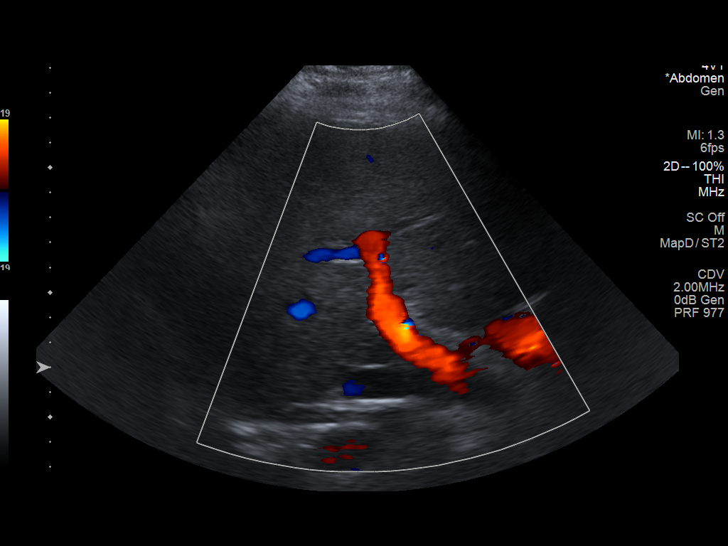

[14 of 25 positions shown; findings below may reference images not displayed]

FINDINGS: Gallbladder:

No gallstones or wall thickening visualized. No sonographic Murphy
sign noted by sonographer.

Common bile duct:

Diameter: 4 mm

Liver:

No focal lesion identified. Liver echogenicity is increased,
consistent with hepatic steatosis. Portal vein is patent on color
Doppler imaging with normal direction of blood flow towards the
liver.

Other: None.
IMPRESSION: Hepatic steatosis.

## 2021-01-09 ENCOUNTER — Encounter: Payer: 59 | Admitting: Family Medicine

## 2021-02-28 ENCOUNTER — Ambulatory Visit (INDEPENDENT_AMBULATORY_CARE_PROVIDER_SITE_OTHER): Payer: Managed Care, Other (non HMO) | Admitting: Family Medicine

## 2021-02-28 ENCOUNTER — Encounter: Payer: Self-pay | Admitting: Family Medicine

## 2021-02-28 ENCOUNTER — Other Ambulatory Visit (HOSPITAL_COMMUNITY)
Admission: RE | Admit: 2021-02-28 | Discharge: 2021-02-28 | Disposition: A | Discharge status: Home / Self Care | Payer: 59 | Source: Ambulatory Visit | Attending: Family Medicine | Admitting: Family Medicine

## 2021-02-28 VITALS — BP 122/81 | HR 84 | Temp 98.7°F | Ht 67.0 in | Wt 295.0 lb

## 2021-02-28 DIAGNOSIS — E611 Iron deficiency: Secondary | ICD-10-CM

## 2021-02-28 DIAGNOSIS — L732 Hidradenitis suppurativa: Secondary | ICD-10-CM | POA: Diagnosis not present

## 2021-02-28 DIAGNOSIS — L918 Other hypertrophic disorders of the skin: Secondary | ICD-10-CM

## 2021-02-28 DIAGNOSIS — Z01411 Encounter for gynecological examination (general) (routine) with abnormal findings: Secondary | ICD-10-CM

## 2021-02-28 DIAGNOSIS — Z124 Encounter for screening for malignant neoplasm of cervix: Secondary | ICD-10-CM | POA: Diagnosis present

## 2021-02-28 DIAGNOSIS — R110 Nausea: Secondary | ICD-10-CM

## 2021-02-28 DIAGNOSIS — K219 Gastro-esophageal reflux disease without esophagitis: Secondary | ICD-10-CM

## 2021-02-28 DIAGNOSIS — Z01419 Encounter for gynecological examination (general) (routine) without abnormal findings: Secondary | ICD-10-CM

## 2021-02-28 DIAGNOSIS — E063 Autoimmune thyroiditis: Secondary | ICD-10-CM

## 2021-02-28 DIAGNOSIS — K449 Diaphragmatic hernia without obstruction or gangrene: Secondary | ICD-10-CM

## 2021-02-28 DIAGNOSIS — E559 Vitamin D deficiency, unspecified: Secondary | ICD-10-CM

## 2021-02-28 LAB — BAYER DCA HB A1C WAIVED: HB A1C (BAYER DCA - WAIVED): 5.1 % (ref 4.8–5.6)

## 2021-02-28 MED ORDER — CLINDAMYCIN PHOSPHATE 1 % EX SWAB: 1.0000 | Freq: Two times a day (BID) | CUTANEOUS | 12 refills | Status: DC

## 2021-02-28 MED ORDER — WEGOVY 0.25 MG/0.5ML ~~LOC~~ SOAJ: 0.2500 mg | SUBCUTANEOUS | 0 refills | Status: DC

## 2021-02-28 MED ORDER — WEGOVY 0.5 MG/0.5ML ~~LOC~~ SOAJ: 0.5000 mg | SUBCUTANEOUS | 0 refills | Status: DC

## 2021-02-28 MED ORDER — OMEPRAZOLE 20 MG PO CPDR: 20.0000 mg | DELAYED_RELEASE_CAPSULE | Freq: Every day | ORAL | 3 refills | Status: DC

## 2021-02-28 MED ORDER — ONDANSETRON HCL 4 MG PO TABS: 4.0000 mg | ORAL_TABLET | Freq: Three times a day (TID) | ORAL | 0 refills | Status: DC | PRN

## 2021-02-28 MED ORDER — NORETHIN ACE-ETH ESTRAD-FE 1-20 MG-MCG PO TABS: 1.0000 | ORAL_TABLET | Freq: Every day | ORAL | 4 refills | Status: DC

## 2021-02-28 MED ORDER — ESCITALOPRAM OXALATE 20 MG PO TABS: 20.0000 mg | ORAL_TABLET | Freq: Every day | ORAL | 3 refills | Status: DC

## 2021-02-28 MED ORDER — WEGOVY 1 MG/0.5ML ~~LOC~~ SOAJ: 1.0000 mg | SUBCUTANEOUS | 0 refills | Status: DC

## 2021-02-28 NOTE — Patient Instructions (Signed)
You had labs performed today.  You will be contacted with the results of the labs once they are available, usually in the next 3 business days for routine lab work.  If you have an active my chart account, they will be released to your MyChart.  If you prefer to have these labs released to you via telephone, please let us know.  If you had a pap smear or biopsy performed, expect to be contacted in about 7-10 days.  Preventive Care 27-27 Years Old, Female Preventive care refers to lifestyle choices and visits with your health care provider that can promote health and wellness. Preventive care visits are also called wellness exams. What can I expect for my preventive care visit? Counseling During your preventive care visit, your health care provider may ask about your: Medical history, including: Past medical problems. Family medical history. Pregnancy history. Current health, including: Menstrual cycle. Method of birth control. Emotional well-being. Home life and relationship well-being. Sexual activity and sexual health. Lifestyle, including: Alcohol, nicotine or tobacco, and drug use. Access to firearms. Diet, exercise, and sleep habits. Work and work Statistician. Sunscreen use. Safety issues such as seatbelt and bike helmet use. Physical exam Your health care provider may check your: Height and weight. These may be used to calculate your BMI (body mass index). BMI is a measurement that tells if you are at a healthy weight. Waist circumference. This measures the distance around your waistline. This measurement also tells if you are at a healthy weight and may help predict your risk of certain diseases, such as type 2 diabetes and high blood pressure. Heart rate and blood pressure. Body temperature. Skin for abnormal spots. What immunizations do I need? Vaccines are usually given at various ages, according to a schedule. Your health care provider will recommend vaccines for you  based on your age, medical history, and lifestyle or other factors, such as travel or where you work. What tests do I need? Screening Your health care provider may recommend screening tests for certain conditions. This may include: Pelvic exam and Pap test. Lipid and cholesterol levels. Diabetes screening. This is done by checking your blood sugar (glucose) after you have not eaten for a while (fasting). Hepatitis B test. Hepatitis C test. HIV (human immunodeficiency virus) test. STI (sexually transmitted infection) testing, if you are at risk. BRCA-related cancer screening. This may be done if you have a family history of breast, ovarian, tubal, or peritoneal cancers. Talk with your health care provider about your test results, treatment options, and if necessary, the need for more tests. Follow these instructions at home: Eating and drinking  Eat a healthy diet that includes fresh fruits and vegetables, whole grains, lean protein, and low-fat dairy products. Take vitamin and mineral supplements as recommended by your health care provider. Do not drink alcohol if: Your health care provider tells you not to drink. You are pregnant, may be pregnant, or are planning to become pregnant. If you drink alcohol: Limit how much you have to 0-1 drink a day. Know how much alcohol is in your drink. In the [place] one drink equals one 12 oz bottle of beer (355 mL), one 5 oz glass of wine (148 mL), or one 1 oz glass of hard liquor (44 mL). Lifestyle Brush your teeth every morning and night with fluoride toothpaste. Floss one time each day. Exercise for at least 30 minutes 5 or more days each week. Do not use any products that contain nicotine or tobacco. These  products include cigarettes, chewing tobacco, and vaping devices, such as e-cigarettes. If you need help quitting, ask your health care provider. Do not use drugs. If you are sexually active, practice safe sex. Use a condom or other form of  protection to prevent STIs. If you do not wish to become pregnant, use a form of birth control. If you plan to become pregnant, see your health care provider for a prepregnancy visit. Find healthy ways to manage stress, such as: Meditation, yoga, or listening to music. Journaling. Talking to a trusted person. Spending time with friends and family. Minimize exposure to UV radiation to reduce your risk of skin cancer. Safety Always wear your seat belt while driving or riding in a vehicle. Do not drive: If you have been drinking alcohol. Do not ride with someone who has been drinking. If you have been using any mind-altering substances or drugs. While texting. When you are tired or distracted. Wear a helmet and other protective equipment during sports activities. If you have firearms in your house, make sure you follow all gun safety procedures. Seek help if you have been physically or sexually abused. What's next? Go to your health care provider once a year for an annual wellness visit. Ask your health care provider how often you should have your eyes and teeth checked. Stay up to date on all vaccines. This information is not intended to replace advice given to you by your health care provider. Make sure you discuss any questions you have with your health care provider. Document Revised: 06/21/2020 Document Reviewed: 06/21/2020 Elsevier Patient Education  [place].

## 2021-02-28 NOTE — Progress Notes (Signed)
Janet [name] is a 26 y.o. female presents to office today for annual physical exam examination.    Concerns today include: 1.  Hypothyroidism/morbid obesity Patient will need a new referral to endocrinology if her thyroid levels are not appropriate today.  She has not had her thyroid levels checked in a while but seems to be doing relatively well on 300 mcg of Tirosint.  This has been the only formulation that has been relatively helpful.  She notes that her generalized edema has gotten quite a bit better though has not totally resolved.  She is using Lasix every other day to help with her fluid.  She continues to struggle with weight loss however.  She eats small meals but still cannot lose the weight.  She is interested in pursuing weight loss medications if possible.  Denies any history of medullary thyroid cancer, multiple endocrine type II neoplasia or pancreatitis both personally or within the family.  She is not planning for pregnancy anytime soon.  She is not sexually active.   Occupation: Works in Psychiatrist, Marital status: Single, Substance use: None Diet: Small meals, Exercise: No structured Last eye exam: Needs Last pap smear: Needs Immunizations needed: Immunization History  Administered Date(s) Administered   DTaP 08/14/1994, 10/09/1994, 12/11/1994, 09/23/1995, 08/08/1999   HPV Quadrivalent 10/02/2005, 12/02/2005, 07/31/2009   Hepatitis B 12/22/1994, 08/14/1994, 12/11/1994   HiB (PRP-OMP) 08/14/1994, 10/09/1994, 12/11/1994, 09/23/1995   IPV 08/14/1994, 10/09/1994, 12/11/1994, 08/08/1999   Influenza,inj,Quad PF,6+ Mos 10/26/2012, 01/21/2014, 11/25/2014, 10/16/2016, 10/03/2017, 08/31/2018, 11/22/2019, 11/14/2020   Influenza-Unspecified 08/31/2018   MMR 06/17/1995, 08/08/1999   Moderna Sars-Covid-2 Vaccination 05/24/2019, 06/21/2019, 01/25/2020   Td 07/31/2009, 11/22/2019   Tdap 07/31/2009, 01/22/2016   Varicella 09/23/1995     Past Medical History:  Diagnosis  Date   Allergy    Anemia    Anxiety    Chronic kidney disease    stage 2 she says    GERD (gastroesophageal reflux disease)    Headache(784.0)    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   Hypothyroidism    Sleep apnea    Thyroid disease    Hashimotos    Social History   Socioeconomic History   Marital status: Single    Spouse name: Not on file   Number of children: Not on file   Years of education: Not on file   Highest education level: Not on file  Occupational History   Occupation: Scientist, water quality    Comment: Customer service  Tobacco Use   Smoking status: Some Days    Packs/day: 0.50    Years: 2.00    Pack years: 1.00    Types: Cigarettes   Smokeless tobacco: Never  Vaping Use   Vaping Use: Never used  Substance and Sexual Activity   Alcohol use: Not Currently    Comment: rare   Drug use: No   Sexual activity: Not Currently    Birth control/protection: None, Pill  Other Topics Concern   Not on file  Social History Narrative   Not on file   Social Determinants of Health   Financial Resource Strain: Not on file  Food Insecurity: Not on file  Transportation Needs: Not on file  Physical Activity: Not on file  Stress: Not on file  Social Connections: Not on file  Intimate Partner Violence: Not on file   Past Surgical History:  Procedure Laterality Date   BIOPSY  05/09/2020   Procedure: BIOPSY;  Surgeon: Harvel Quale, MD;  [facility];  Service: Gastroenterology;;   CHOLECYSTECTOMY N/A 05/15/2020   Procedure: LAPAROSCOPIC CHOLECYSTECTOMY;  Surgeon: Virl Cagey, MD;  Location: [facility];  Service: General;  Laterality: N/A;   ESOPHAGOGASTRODUODENOSCOPY (EGD) WITH PROPOFOL N/A 05/09/2020   Procedure: ESOPHAGOGASTRODUODENOSCOPY (EGD) WITH PROPOFOL;  Surgeon: Harvel Quale, MD;  Location: [facility];  Service: Gastroenterology;  Laterality: N/A;  AM   THYROIDECTOMY N/A 07/01/2017   Procedure: TOTAL THYROIDECTOMY;  Surgeon:  [name], MD;  Location: [facility];  Service: General;  Laterality: N/A;   TONSILLECTOMY AND ADENOIDECTOMY Bilateral 01/2011   TOTAL THYROIDECTOMY     Dr. [name] 07-01-17   Family History  Problem Relation Age of Onset   Heart attack Maternal Grandmother    Heart Problems Maternal Grandfather    Heart attack Maternal Grandfather    Cancer Paternal Grandmother    Cancer Paternal Grandfather    Diabetes Mother    Other Mother        gastropresis   Anxiety disorder Mother    Other Father        syncope-has a pacemaker   COPD Father     Current Outpatient Medications:    acetaminophen (TYLENOL) 650 MG CR tablet, Take 650 mg by mouth every 8 (eight) hours as needed (for menstrual pain/cramps)., Disp: , Rfl:    dicyclomine (BENTYL) 10 MG capsule, Take 1 capsule (10 mg total) by mouth 2 (two) times daily before a meal., Disp: 60 capsule, Rfl: 5   ergocalciferol (VITAMIN D2) 1.25 MG (50000 UT) capsule, Take 50,000 Units by mouth once a week., Disp: , Rfl:    escitalopram (LEXAPRO) 20 MG tablet, Take 1 tablet (20 mg total) by mouth daily. (Patient taking differently: Take 20 mg by mouth at bedtime.), Disp: 90 tablet, Rfl: 3   ferrous sulfate 324 (65 Fe) MG TBEC, Take 1 tablet (324 mg total) by mouth daily. (Patient taking differently: Take 324 mg by mouth at bedtime.), Disp: 90 tablet, Rfl: 3   furosemide (LASIX) 20 MG tablet, Take 2 tablets (40 mg total) by mouth every other day. (Patient taking differently: Take 20-40 mg by mouth daily.), Disp: 90 tablet, Rfl: 3   ibuprofen (ADVIL) 200 MG tablet, Take 400-600 mg by mouth every 6 (six) hours as needed for headache., Disp: , Rfl:    metoCLOPramide (REGLAN) 5 MG tablet, Take 1 tablet (5 mg total) by mouth 4 (four) times daily -  before meals and at bedtime., Disp: 120 tablet, Rfl: 1   norethindrone-ethinyl estradiol-FE (LOESTRIN FE) 1-20 MG-MCG tablet, TAKE 1 TABLET DAILY, Disp: 84 tablet, Rfl: 0   omeprazole (PRILOSEC) 20 MG capsule, Take 1  capsule (20 mg total) by mouth daily. (Patient taking differently: Take 20 mg by mouth daily after lunch.), Disp: 30 capsule, Rfl: 3   ondansetron (ZOFRAN) 4 MG tablet, Take 1 tablet (4 mg total) by mouth every 8 (eight) hours as needed for nausea or vomiting., Disp: 30 tablet, Rfl: 0   TIROSINT 150 MCG CAPS, Take 300 mg by mouth every morning., Disp: , Rfl:    vitamin B-12 (CYANOCOBALAMIN) 1000 MCG tablet, Take 1,000 mcg by mouth daily., Disp: , Rfl:   No Known Allergies   ROS: Review of Systems Pertinent items noted in HPI and remainder of comprehensive ROS otherwise negative.    Physical exam BP 122/81    Pulse 84    Temp 98.7 F (37.1 C)    Ht 5' 7"  (1.702 m)    Wt 295 lb (  133.8 kg)    SpO2 96%    BMI 46.20 kg/m  General appearance: alert, cooperative, appears stated age, no distress, and morbidly obese Head: Normocephalic, without obvious abnormality, atraumatic Eyes: negative findings: Right upper eyelid with skin tag noted.  Lashes normal, conjunctivae and sclerae normal, corneas clear, and pupils equal, round, reactive to light and accomodation Ears: normal TM's and external ear canals both ears Nose: Nares normal. Septum midline. Mucosa normal. No drainage or sinus tenderness. Throat: lips, mucosa, and tongue normal; teeth and gums normal Neck: no adenopathy, supple, symmetrical, trachea midline, and thyroid not enlarged, symmetric, no tenderness/mass/nodules Back: symmetric, no curvature. ROM normal. No CVA tenderness. Lungs: clear to auscultation bilaterally Heart: regular rate and rhythm, S1, S2 normal, no murmur, click, rub or gallop Abdomen: soft, non-tender; bowel sounds normal; no masses,  no organomegaly Pelvic: cervix normal in appearance, external genitalia normal, no adnexal masses or tenderness, no cervical motion tenderness, rectovaginal septum normal, uterus normal size, shape, and consistency, and vagina normal without discharge Extremities:  Nonpitting edema  present.  +2 pedal pulses Pulses: 2+ and symmetric Skin:  She has some postinflammatory hyperpigmentation along the medial thighs bilaterally.  Lesions of concern seem consistent with hidradenitis suppurativa Lymph nodes: Cervical, supraclavicular, and axillary nodes normal. Neurologic: Grossly normal Psych: Mood stable, speech normal  Depression screen [facility] 2/9 02/28/2021 02/15/2020 02/15/2020  Decreased Interest 2 3 0  Down, Depressed, Hopeless 0 0 0  PHQ - 2 Score 2 3 0  Altered sleeping 1 3 -  Tired, decreased energy 2 3 -  Change in appetite 3 3 -  Feeling bad or failure about yourself  2 1 -  Trouble concentrating 0 1 -  Moving slowly or fidgety/restless 0 1 -  Suicidal thoughts 0 1 -  PHQ-9 Score 10 16 -  Difficult doing work/chores Somewhat difficult Somewhat difficult -  Some recent data might be hidden   GAD 7 : Generalized Anxiety Score 02/28/2021 02/15/2020 12/20/2019 04/07/2019  Nervous, Anxious, on Edge 2 3 2 2   Control/stop worrying 2 2 1 2   Worry too much - different things 2 2 1 2   Trouble relaxing 2 2 1  0  Restless 1 0 1 0  Easily annoyed or irritable 3 3 2 2   Afraid - awful might happen 0 1 0 0  Total GAD 7 Score 12 13 8 8   Anxiety Difficulty Somewhat difficult Somewhat difficult Somewhat difficult Somewhat difficult    Assessment/ Plan: Janet [name] here for annual physical exam.   Well woman exam with routine gynecological exam  Screening for malignant neoplasm of cervix - Plan: Cytology - PAP  Morbid obesity ([place]) - Plan: Lipid Panel, CMP14+EGFR, Bayer DCA Hb A1c Waived, Semaglutide-Weight Management (WEGOVY) 0.5 MG/0.5ML SOAJ, Semaglutide-Weight Management (WEGOVY) 1 MG/0.5ML SOAJ  Hashimoto's thyroiditis - Plan: TSH, T4, Free  Hidradenitis suppurativa - Plan: clindamycin (CLEOCIN T) 1 % SWAB, CBC, Ambulatory referral to Dermatology  Skin tag - Plan: Ambulatory referral to Dermatology  Iron deficiency - Plan: CBC, Ferritin  Vitamin D deficiency - Plan:  VITAMIN D 25 Hydroxy (Vit-D Deficiency, Fractures)  Gastroesophageal reflux disease without esophagitis - Plan: omeprazole (PRILOSEC) 20 MG capsule  Hiatal hernia - Plan: omeprazole (PRILOSEC) 20 MG capsule  Nausea - Plan: ondansetron (ZOFRAN) 4 MG tablet  Pap smear performed today.  She is up-to-date on preventive healthcare otherwise  We talked about options for treatment of obesity.  I do not think that phentermine is a good choice for this  patient as it has a very short duration of use.  I am going to trial her on Wegovy.  She looked up on her insurance to see if this was a medication that was covered and apparently they do so I started her on a sample pack of the 0.25.  We demonstrated and did the first injection in office together.  We discussed possible side effects and things to avoid.  Encourage p.o. hydration.  The 0.5 and 1 mg injections have been sent to her pharmacy for months 2 and 3.  I would like to see her again in 3 months for recheck of weight and tolerance of medication.  We will continue to escalate her medication as tolerated.  Check TSH, free T4.  If her levels are not at goal I will go ahead and place a new referral to endocrinology for somebody that is in her network.  However, if her levels are within normal range I am certainly glad to continue her medications here at the office.  Lesions on the thighs of concern are consistent with a hidradenitis suppurativa.  I have placed her on clindamycin pledgets for treatment.  She can follow-up as needed on that issue but we will go ahead and refer her to Dr. Nevada Crane for the tag on her right eyelid  We will check CBC, ferritin given history of iron deficiency.  Check vitamin D given history of vitamin D deficiency.  PPI and Zofran renewed given history of hiatal hernia, GERD and nausea.  Counseled on healthy lifestyle choices, including diet (rich in fruits, vegetables and lean meats and low in salt and simple carbohydrates) and  exercise (at least 30 minutes of moderate physical activity daily).   Karaline Buresh M. Lajuana Ripple, DO

## 2021-03-01 LAB — LIPID PANEL
Chol/HDL Ratio: 6.6 ratio — ABNORMAL HIGH (ref 0.0–4.4)
Cholesterol, Total: 223 mg/dL — ABNORMAL HIGH (ref 100–199)
HDL: 34 mg/dL — ABNORMAL LOW (ref >39)
LDL Chol Calc (NIH): 156 mg/dL — ABNORMAL HIGH (ref 0–99)
Triglycerides: 179 mg/dL — ABNORMAL HIGH (ref 0–149)
VLDL Cholesterol Cal: 33 mg/dL (ref 5–40)

## 2021-03-01 LAB — CMP14+EGFR
ALT: 10 IU/L (ref 0–32)
AST: 12 IU/L (ref 0–40)
Albumin/Globulin Ratio: 1.5 (ref 1.2–2.2)
Albumin: 4.2 g/dL (ref 3.9–5.0)
Alkaline Phosphatase: 112 IU/L (ref 44–121)
BUN/Creatinine Ratio: 12 (ref 9–23)
BUN: 10 mg/dL (ref 6–20)
Bilirubin Total: 0.4 mg/dL (ref 0.0–1.2)
CO2: 21 mmol/L (ref 20–29)
Calcium: 9.3 mg/dL (ref 8.7–10.2)
Chloride: 102 mmol/L (ref 96–106)
Creatinine, Ser: 0.86 mg/dL (ref 0.57–1.00)
Globulin, Total: 2.8 g/dL (ref 1.5–4.5)
Glucose: 85 mg/dL (ref 70–99)
Potassium: 4 mmol/L (ref 3.5–5.2)
Sodium: 139 mmol/L (ref 134–144)
Total Protein: 7 g/dL (ref 6.0–8.5)
eGFR: 95 mL/min/{1.73_m2} (ref >59)

## 2021-03-01 LAB — VITAMIN D 25 HYDROXY (VIT D DEFICIENCY, FRACTURES): Vit D, 25-Hydroxy: 22.1 ng/mL — ABNORMAL LOW (ref 30.0–100.0)

## 2021-03-01 LAB — CBC
Hematocrit: 40.4 % (ref 34.0–46.6)
Hemoglobin: 13.8 g/dL (ref 11.1–15.9)
MCH: 30.8 pg (ref 26.6–33.0)
MCHC: 34.2 g/dL (ref 31.5–35.7)
MCV: 90 fL (ref 79–97)
Platelets: 322 10*3/uL (ref 150–450)
RBC: 4.48 x10E6/uL (ref 3.77–5.28)
RDW: 13 % (ref 11.7–15.4)
WBC: 10.8 10*3/uL (ref 3.4–10.8)

## 2021-03-01 LAB — TSH: TSH: 60.5 u[IU]/mL — ABNORMAL HIGH (ref 0.450–4.500)

## 2021-03-01 LAB — T4, FREE: Free T4: 0.8 ng/dL — ABNORMAL LOW (ref 0.82–1.77)

## 2021-03-01 LAB — FERRITIN: Ferritin: 28 ng/mL (ref 15–150)

## 2021-03-01 NOTE — Addendum Note (Signed)
Addended by: Raliegh Ip on: 03/01/2021 07:59 AM   Modules accepted: Orders

## 2021-03-02 LAB — CYTOLOGY - PAP
Chlamydia: NEGATIVE
Comment: NEGATIVE
Comment: NORMAL
Diagnosis: NEGATIVE
Neisseria Gonorrhea: NEGATIVE

## 2021-03-28 ENCOUNTER — Other Ambulatory Visit: Payer: Self-pay

## 2021-03-28 ENCOUNTER — Ambulatory Visit: Payer: Managed Care, Other (non HMO) | Admitting: Nurse Practitioner

## 2021-03-28 ENCOUNTER — Encounter: Payer: Self-pay | Admitting: Nurse Practitioner

## 2021-03-28 VITALS — BP 116/83 | HR 77 | Ht 67.0 in | Wt 300.0 lb

## 2021-03-28 DIAGNOSIS — E063 Autoimmune thyroiditis: Secondary | ICD-10-CM | POA: Diagnosis not present

## 2021-03-28 DIAGNOSIS — E038 Other specified hypothyroidism: Secondary | ICD-10-CM | POA: Diagnosis not present

## 2021-03-28 MED ORDER — LEVOTHYROXINE SODIUM 200 MCG PO TABS: 200.0000 ug | ORAL_TABLET | Freq: Every day | ORAL | 3 refills | Status: DC

## 2021-03-28 NOTE — Patient Instructions (Signed)

## 2021-03-28 NOTE — Progress Notes (Signed)
?                                   ?                                Endocrinology Consult Note  ?                                       03/28/2021, 2:58 PM ? ?Subjective:  ? ?Subjective   ? ?Janet [name] is a 26 y.o.-year-old female patient being seen in consultation for hypothyroidism referred by Janet Norlander, DO. ? ? ?Past Medical History:  ?Diagnosis Date  ? Allergy   ? Anemia   ? Anxiety   ? Chronic kidney disease   ? stage 2 she says   ? GERD (gastroesophageal reflux disease)   ? Headache(784.0)   ? Heart murmur 1996  ? congenital that resolved.  ? Hypothyroidism   ? Sleep apnea   ? Thyroid disease   ? Hashimotos   ? ? ?Past Surgical History:  ?Procedure Laterality Date  ? BIOPSY  05/09/2020  ? Procedure: BIOPSY;  Surgeon: Harvel Quale, MD;  [facility];  Service: Gastroenterology;;  ? CHOLECYSTECTOMY N/A 05/15/2020  ? Procedure: LAPAROSCOPIC CHOLECYSTECTOMY;  Surgeon: Janet Cagey, MD;  Location: [facility];  Service: General;  Laterality: N/A;  ? ESOPHAGOGASTRODUODENOSCOPY (EGD) WITH PROPOFOL N/A 05/09/2020  ? Procedure: ESOPHAGOGASTRODUODENOSCOPY (EGD) WITH PROPOFOL;  Surgeon: Harvel Quale, MD;  [facility];  Service: Gastroenterology;  Laterality: N/A;  AM  ? THYROIDECTOMY N/A 07/01/2017  ? Procedure: TOTAL THYROIDECTOMY;  Surgeon: Janet Gemma, MD;  Location: [facility];  Service: General;  Laterality: N/A;  ? TONSILLECTOMY AND ADENOIDECTOMY Bilateral 01/2011  ? TOTAL THYROIDECTOMY    ? Dr. Harlow [name] 07-01-17  ? ? ?Social History  ? ?Socioeconomic History  ? Marital status: Single  ?  Spouse name: Not on file  ? Number of children: Not on file  ? Years of education: Not on file  ? Highest education level: Not on file  ?Occupational History  ? 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 Smoking status: Some Days  ?  Packs/day: 0.50  ?  Years: 2.00  ?  Pack years: 1.00  ?  Types: Cigarettes  ? Smokeless tobacco: Never  ?Vaping Use  ? Vaping  Use: Never used  ?Substance and Sexual Activity  ? Alcohol use: Not Currently  ?  Comment: rare  ? Drug use: No  ? Sexual activity: Not Currently  ?  Birth control/protection: None, Pill  ?Other Topics Concern  ? Not on file  ?Social History Narrative  ? Not on file  ? ?Social Determinants of Health  ? ?Financial Resource Strain: Not on file  ?Food Insecurity: Not on file  ?Transportation Needs: Not on file  ?Physical Activity: Not on file  ?Stress: Not on file  ?Social Connections: Not on file  ? ? ?Family History  ?Problem Relation Age of Onset  ? Heart attack Maternal Grandmother   ? Heart Problems Maternal Grandfather   ? Heart attack Maternal Grandfather   ? Cancer Paternal Grandmother   ? Cancer Paternal Grandfather   ? Diabetes Mother   ?  Other Mother   ?     gastropresis  ? Anxiety disorder Mother   ? Other Father   ?     syncope-has a pacemaker  ? COPD Father   ? ? ?Outpatient Encounter Medications as of 03/28/2021  ?Medication Sig  ? dicyclomine (BENTYL) 10 MG capsule Take 1 capsule (10 mg total) by mouth 2 (two) times daily before a meal.  ? ergocalciferol (VITAMIN D2) 1.25 MG (50000 UT) capsule Take 50,000 Units by mouth once a week.  ? escitalopram (LEXAPRO) 20 MG tablet Take 1 tablet (20 mg total) by mouth at bedtime.  ? ferrous sulfate 324 (65 Fe) MG TBEC Take 1 tablet (324 mg total) by mouth daily. (Patient taking differently: Take 324 mg by mouth at bedtime.)  ? furosemide (LASIX) 20 MG tablet Take 2 tablets (40 mg total) by mouth every other day. (Patient taking differently: Take 20-40 mg by mouth daily.)  ? ibuprofen (ADVIL) 200 MG tablet Take 400-600 mg by mouth every 6 (six) hours as needed for headache.  ? levothyroxine (SYNTHROID) 200 MCG tablet Take 1 tablet (200 mcg total) by mouth daily.  ? metoCLOPramide (REGLAN) 5 MG tablet Take 5 mg by mouth 4 (four) times daily.  ? norethindrone-ethinyl estradiol-FE (LOESTRIN FE) 1-20 MG-MCG tablet Take 1 tablet by mouth daily.  ? omeprazole (PRILOSEC)  20 MG capsule Take 1 capsule (20 mg total) by mouth daily.  ? ondansetron (ZOFRAN) 4 MG tablet Take 1 tablet (4 mg total) by mouth every 8 (eight) hours as needed for nausea or vomiting.  ? Semaglutide-Weight Management (WEGOVY) 0.5 MG/0.5ML SOAJ Inject 0.5 mg into the skin every 7 (seven) days. Start month#2  ? vitamin B-12 (CYANOCOBALAMIN) 1000 MCG tablet Take 1,000 mcg by mouth daily.  ? [DISCONTINUED] TIROSINT 150 MCG CAPS Take 300 mg by mouth every morning.  ? acetaminophen (TYLENOL) 650 MG CR tablet Take 650 mg by mouth every 8 (eight) hours as needed (for menstrual pain/cramps).  ? metoCLOPramide (REGLAN) 5 MG tablet Take 1 tablet (5 mg total) by mouth 4 (four) times daily -  before meals and at bedtime.  ? [DISCONTINUED] clindamycin (CLEOCIN T) 1 % SWAB Apply 1 each topically 2 (two) times daily. For skin lesions  ? [DISCONTINUED] Semaglutide-Weight Management (WEGOVY) 0.25 MG/0.5ML SOAJ Inject 0.25 mg into the skin every 7 (seven) days for 28 days.  ? [DISCONTINUED] Semaglutide-Weight Management (WEGOVY) 1 MG/0.5ML SOAJ Inject 1 mg into the skin every 7 (seven) days. Month #3  ? ?No facility-administered encounter medications on file as of 03/28/2021.  ? ? ?ALLERGIES: ?No Known Allergies ?VACCINATION STATUS: ?Immunization History  ?Administered Date(s) Administered  ? DTaP 08/14/1994, 10/09/1994, 12/11/1994, 09/23/1995, 08/08/1999  ? HPV Quadrivalent 10/02/2005, 12/02/2005, 07/31/2009  ? Hepatitis B 1994/10/12, 08/14/1994, 12/11/1994  ? HiB (PRP-OMP) 08/14/1994, 10/09/1994, 12/11/1994, 09/23/1995  ? IPV 08/14/1994, 10/09/1994, 12/11/1994, 08/08/1999  ? Influenza,inj,Quad PF,6+ Mos 10/26/2012, 01/21/2014, 11/25/2014, 10/16/2016, 10/03/2017, 08/31/2018, 11/22/2019, 11/14/2020  ? Influenza-Unspecified 08/31/2018  ? MMR 06/17/1995, 08/08/1999  ? Moderna Sars-Covid-2 Vaccination 05/24/2019, 06/21/2019, 01/25/2020  ? Td 07/31/2009, 11/22/2019  ? Tdap 07/31/2009, 01/22/2016  ? Varicella 09/23/1995  ? ? ? ?HPI   ? ?Janet [name]  is a patient with the above medical history. she was diagnosed with Hashimoto's thyroiditis complicated by large goiter at approximate age of 40 years, which required total thyroidectomy and subsequent initiation of thyroid hormone replacement. she was given various doses of thyroid hormone over the years, currently on Tirosint 150 micrograms (was given samples).  she reports compliance to this medication:  Taking it daily on empty stomach  with water, separated by >30 minutes before breakfast and other medications , and by at least 4 hours from calcium, iron, PPIs, multivitamins . ? ?I reviewed patient's thyroid tests: ? ?Lab Results  ?Component Value Date  ? TSH 60.500 (H) 02/28/2021  ? TSH 301.000 (H) 12/20/2019  ? TSH 209.200 (H) 10/03/2017  ? TSH 9.04 (H) 04/16/2017  ? TSH 13.34 (H) 12/25/2016  ? TSH 10.57 (H) 10/16/2016  ? TSH 12.48 (H) 02/26/2016  ? TSH 12.42 (H) 11/02/2015  ? TSH 11.37 (H) 08/25/2015  ? TSH 4.68 (H) 07/12/2015  ? FREET4 0.80 (L) 02/28/2021  ? FREET4 0.56 (L) 04/16/2017  ? FREET4 0.64 12/25/2016  ? FREET4 0.71 10/16/2016  ? FREET4 0.83 02/26/2016  ? FREET4 0.48 (L) 11/02/2015  ? FREET4 0.88 08/25/2015  ? FREET4 0.77 07/12/2015  ? FREET4 0.60 05/26/2015  ? FREET4 0.64 11/25/2014  ?  ? ?Pt describes: ?- weight gain ?- fatigue ?- fluid retention ?- mental fog ? ?Pt denies feeling nodules in neck, hoarseness, dysphagia/odynophagia, SOB with lying down. ? ?she denies family history of thyroid disorders.  No family history of thyroid cancer.  ?No history of radiation therapy to head or neck.  No recent use of iodine supplements.  Denies use of Biotin containing supplements. ? ?I reviewed her chart and she also has a history of anemia, CKD, GERD, OSA. ? ? ?ROS: ? ?Constitutional: + weight gain, + fatigue, no subjective hyperthermia, no subjective hypothermia ?Eyes: no blurry vision, no xerophthalmia ?ENT: no sore throat, no nodules palpated in throat, no dysphagia/odynophagia, no  hoarseness ?Cardiovascular: no chest pain, no SOB, no palpitations, intermittent generalized swelling ?Respiratory: no cough, no SOB ?Gastrointestinal: no nausea/vomiting/diarrhea ?Musculoskeletal: no muscle

## 2021-03-29 ENCOUNTER — Telehealth: Payer: Self-pay | Admitting: Family Medicine

## 2021-04-04 ENCOUNTER — Telehealth: Payer: Self-pay | Admitting: *Deleted

## 2021-04-04 NOTE — Telephone Encounter (Signed)
(  Key: [id number]) ?Rx #: [id number] ?Wegovy 0.5MG /0.5ML auto-injectors ? ?  ?Form ?Teaching laboratory technician Prior Authorization Form ([phone] [employer]) ? ?Sent to plan ?

## 2021-04-16 ENCOUNTER — Other Ambulatory Visit: Payer: Managed Care, Other (non HMO)

## 2021-04-16 ENCOUNTER — Ambulatory Visit (HOSPITAL_COMMUNITY)
Admission: RE | Admit: 2021-04-16 | Discharge: 2021-04-16 | Disposition: A | Discharge status: Home / Self Care | Payer: Managed Care, Other (non HMO) | Source: Ambulatory Visit | Attending: Nurse Practitioner | Admitting: Nurse Practitioner

## 2021-04-16 DIAGNOSIS — E063 Autoimmune thyroiditis: Secondary | ICD-10-CM | POA: Insufficient documentation

## 2021-04-16 DIAGNOSIS — E038 Other specified hypothyroidism: Secondary | ICD-10-CM | POA: Insufficient documentation

## 2021-04-17 LAB — TSH: TSH: 6.44 u[IU]/mL — ABNORMAL HIGH (ref 0.450–4.500)

## 2021-04-17 LAB — T4, FREE: Free T4: 1.04 ng/dL (ref 0.82–1.77)

## 2021-04-17 NOTE — Telephone Encounter (Signed)
Please inform pt

## 2021-04-17 NOTE — Telephone Encounter (Signed)
Message from Plan ?PA Case: [id number], Status: Closed, Closed Reason Code: FM Product not covered by this plan. Prior Authorization not available., Closed Rationale: [employer] Rx Prior Authorization team is unable to review this product for a coverage determination as the requested medication is not on the patient's formulary. Please reach out to [employer] directly for this request. Thank you in advance.. Questions? Contact [phone]. ?

## 2021-04-17 NOTE — Telephone Encounter (Signed)
Pt aware.

## 2021-04-25 ENCOUNTER — Ambulatory Visit (INDEPENDENT_AMBULATORY_CARE_PROVIDER_SITE_OTHER): Payer: Managed Care, Other (non HMO) | Admitting: Nurse Practitioner

## 2021-04-25 ENCOUNTER — Encounter: Payer: Self-pay | Admitting: Nurse Practitioner

## 2021-04-25 VITALS — BP 110/76 | HR 78 | Ht 67.0 in | Wt 295.4 lb

## 2021-04-25 DIAGNOSIS — E063 Autoimmune thyroiditis: Secondary | ICD-10-CM | POA: Diagnosis not present

## 2021-04-25 DIAGNOSIS — E038 Other specified hypothyroidism: Secondary | ICD-10-CM | POA: Diagnosis not present

## 2021-04-25 MED ORDER — LEVOTHYROXINE SODIUM 100 MCG PO TABS: 100.0000 ug | ORAL_TABLET | Freq: Every day | ORAL | 1 refills | Status: DC

## 2021-04-25 MED ORDER — LEVOTHYROXINE SODIUM 112 MCG PO TABS: 112.0000 ug | ORAL_TABLET | Freq: Every day | ORAL | 1 refills | Status: DC

## 2021-04-25 NOTE — Progress Notes (Signed)
?                                   ?                                Endocrinology Consult Note  ?                                       04/25/2021, 2:53 PM ? ?Subjective:  ? ?Subjective   ? ?Janet [name] is a 26 y.o.-year-old female patient being seen in consultation for hypothyroidism referred by [name], DO. ? ? ?Past Medical History:  ?Diagnosis Date  ? Allergy   ? Anemia   ? Anxiety   ? Chronic kidney disease   ? stage 2 she says   ? GERD (gastroesophageal reflux disease)   ? Headache(784.0)   ? Heart murmur 1996  ? congenital that resolved.  ? Hypothyroidism   ? Sleep apnea   ? Thyroid disease   ? Hashimotos   ? ? ?Past Surgical History:  ?Procedure Laterality Date  ? BIOPSY  05/09/2020  ? Procedure: BIOPSY;  Surgeon: Harvel Quale, MD;  Location: [facility];  Service: Gastroenterology;;  ? CHOLECYSTECTOMY N/A 05/15/2020  ? Procedure: LAPAROSCOPIC CHOLECYSTECTOMY;  Surgeon: Virl Cagey, MD;  Location: [facility];  Service: General;  Laterality: N/A;  ? ESOPHAGOGASTRODUODENOSCOPY (EGD) WITH PROPOFOL N/A 05/09/2020  ? Procedure: ESOPHAGOGASTRODUODENOSCOPY (EGD) WITH PROPOFOL;  Surgeon: Harvel Quale, MD;  Location: [facility];  Service: Gastroenterology;  Laterality: N/A;  AM  ? THYROIDECTOMY N/A 07/01/2017  ? Procedure: TOTAL THYROIDECTOMY;  Surgeon: [name], MD;  Location: [facility];  Service: General;  Laterality: N/A;  ? TONSILLECTOMY AND ADENOIDECTOMY Bilateral 01/2011  ? TOTAL THYROIDECTOMY    ? Dr. [name] 07-01-17  ? ? ?Social History  ? ?Socioeconomic History  ? Marital status: Single  ?  Spouse name: Not on file  ? Number of children: Not on file  ? Years of education: Not on file  ? Highest education level: Not on file  ?Occupational History  ? 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 Smoking status: Some Days  ?  Packs/day: 0.50  ?  Years: 2.00  ?  Pack years: 1.00  ?  Types: Cigarettes  ? Smokeless tobacco: Never  ?Vaping Use  ? Vaping  Use: Never used  ?Substance and Sexual Activity  ? Alcohol use: Not Currently  ?  Comment: rare  ? Drug use: No  ? Sexual activity: Not Currently  ?  Birth control/protection: None, Pill  ?Other Topics Concern  ? Not on file  ?Social History Narrative  ? Not on file  ? ?Social Determinants of Health  ? ?Financial Resource Strain: Not on file  ?Food Insecurity: Not on file  ?Transportation Needs: Not on file  ?Physical Activity: Not on file  ?Stress: Not on file  ?Social Connections: Not on file  ? ? ?Family History  ?Problem Relation Age of Onset  ? Heart attack Maternal Grandmother   ? Heart Problems Maternal Grandfather   ? Heart attack Maternal Grandfather   ? Cancer Paternal Grandmother   ? Cancer Paternal Grandfather   ? Diabetes Mother   ?  Other Mother   ?     gastropresis  ? Anxiety disorder Mother   ? Other Father   ?     syncope-has a pacemaker  ? COPD Father   ? ? ?Outpatient Encounter Medications as of 04/25/2021  ?Medication Sig  ? acetaminophen (TYLENOL) 650 MG CR tablet Take 650 mg by mouth every 8 (eight) hours as needed (for menstrual pain/cramps).  ? dicyclomine (BENTYL) 10 MG capsule Take 1 capsule (10 mg total) by mouth 2 (two) times daily before a meal.  ? doxycycline (VIBRA-TABS) 100 MG tablet Take 100 mg by mouth 2 (two) times daily.  ? ergocalciferol (VITAMIN D2) 1.25 MG (50000 UT) capsule Take 50,000 Units by mouth once a week.  ? escitalopram (LEXAPRO) 20 MG tablet Take 1 tablet (20 mg total) by mouth at bedtime.  ? ferrous sulfate 324 (65 Fe) MG TBEC Take 1 tablet (324 mg total) by mouth daily. (Patient taking differently: Take 324 mg by mouth at bedtime.)  ? furosemide (LASIX) 20 MG tablet Take 2 tablets (40 mg total) by mouth every other day. (Patient taking differently: Take 20-40 mg by mouth daily.)  ? ibuprofen (ADVIL) 200 MG tablet Take 400-600 mg by mouth every 6 (six) hours as needed for headache.  ? levothyroxine (SYNTHROID) 112 MCG tablet Take 1 tablet (112 mcg total) by mouth  daily.  ? metoCLOPramide (REGLAN) 5 MG tablet Take 5 mg by mouth 4 (four) times daily.  ? norethindrone-ethinyl estradiol-FE (LOESTRIN FE) 1-20 MG-MCG tablet Take 1 tablet by mouth daily.  ? omeprazole (PRILOSEC) 20 MG capsule Take 1 capsule (20 mg total) by mouth daily.  ? ondansetron (ZOFRAN) 4 MG tablet Take 1 tablet (4 mg total) by mouth every 8 (eight) hours as needed for nausea or vomiting.  ? vitamin B-12 (CYANOCOBALAMIN) 1000 MCG tablet Take 1,000 mcg by mouth daily.  ? [DISCONTINUED] levothyroxine (SYNTHROID) 200 MCG tablet Take 1 tablet (200 mcg total) by mouth daily.  ? levothyroxine (SYNTHROID) 100 MCG tablet Take 1 tablet (100 mcg total) by mouth daily before breakfast.  ? metoCLOPramide (REGLAN) 5 MG tablet Take 1 tablet (5 mg total) by mouth 4 (four) times daily -  before meals and at bedtime.  ? [DISCONTINUED] Semaglutide-Weight Management (WEGOVY) 0.5 MG/0.5ML SOAJ Inject 0.5 mg into the skin every 7 (seven) days. Start month#2 (Patient not taking: Reported on 04/25/2021)  ? ?No facility-administered encounter medications on file as of 04/25/2021.  ? ? ?ALLERGIES: ?No Known Allergies ?VACCINATION STATUS: ?Immunization History  ?Administered Date(s) Administered  ? DTaP 08/14/1994, 10/09/1994, 12/11/1994, 09/23/1995, 08/08/1999  ? HPV Quadrivalent 10/02/2005, 12/02/2005, 07/31/2009  ? Hepatitis B 04-24-94, 08/14/1994, 12/11/1994  ? HiB (PRP-OMP) 08/14/1994, 10/09/1994, 12/11/1994, 09/23/1995  ? IPV 08/14/1994, 10/09/1994, 12/11/1994, 08/08/1999  ? Influenza,inj,Quad PF,6+ Mos 10/26/2012, 01/21/2014, 11/25/2014, 10/16/2016, 10/03/2017, 08/31/2018, 11/22/2019, 11/14/2020  ? Influenza-Unspecified 08/31/2018  ? MMR 06/17/1995, 08/08/1999  ? Moderna Sars-Covid-2 Vaccination 05/24/2019, 06/21/2019, 01/25/2020  ? Td 07/31/2009, 11/22/2019  ? Tdap 07/31/2009, 01/22/2016  ? Varicella 09/23/1995  ? ? ? ?HPI  ? ?Janet [name]  is a patient with the above medical history. she was diagnosed with Hashimoto's  thyroiditis complicated by large goiter at approximate age of 26 years, which required total thyroidectomy and subsequent initiation of thyroid hormone replacement. she was given various doses of thyroid hormone over the years, currently on Tirosint 150 micrograms (was given samples). she reports compliance to this medication:  Taking it daily on empty stomach  with water, separated by >30  minutes before breakfast and other medications , and by at least 4 hours from calcium, iron, PPIs, multivitamins . ? ?I reviewed patient's thyroid tests: ? ?Lab Results  ?Component Value Date  ? TSH 6.440 (H) 04/16/2021  ? TSH 60.500 (H) 02/28/2021  ? TSH 301.000 (H) 12/20/2019  ? TSH 209.200 (H) 10/03/2017  ? TSH 9.04 (H) 04/16/2017  ? TSH 13.34 (H) 12/25/2016  ? TSH 10.57 (H) 10/16/2016  ? TSH 12.48 (H) 02/26/2016  ? TSH 12.42 (H) 11/02/2015  ? TSH 11.37 (H) 08/25/2015  ? FREET4 1.04 04/16/2021  ? FREET4 0.80 (L) 02/28/2021  ? FREET4 0.56 (L) 04/16/2017  ? FREET4 0.64 12/25/2016  ? FREET4 0.71 10/16/2016  ? FREET4 0.83 02/26/2016  ? FREET4 0.48 (L) 11/02/2015  ? FREET4 0.88 08/25/2015  ? FREET4 0.77 07/12/2015  ? FREET4 0.60 05/26/2015  ?  ? ?Pt describes: ?- weight gain ?- fatigue ?- fluid retention ?- mental fog ? ?Pt denies feeling nodules in neck, hoarseness, dysphagia/odynophagia, SOB with lying down. ? ?she denies family history of thyroid disorders.  No family history of thyroid cancer.  ?No history of radiation therapy to head or neck.  No recent use of iodine supplements.  Denies use of Biotin containing supplements. ? ?I reviewed her chart and she also has a history of anemia, CKD, GERD, OSA. ? ? ?ROS: ? ?Constitutional: + minimally fluctuating weight, + fatigue-improving, no subjective hyperthermia, no subjective hypothermia ?Eyes: no blurry vision, no xerophthalmia ?ENT: no sore throat, no nodules palpated in throat, no dysphagia/odynophagia, no hoarseness ?Cardiovascular: no chest pain, no SOB, no palpitations,  intermittent generalized swelling ?Respiratory: no cough, no SOB ?Gastrointestinal: no nausea/vomiting/diarrhea ?Musculoskeletal: no muscle/joint aches ?Skin: no rashes ?Neurological: no tremors, no numbness, no t

## 2021-04-25 NOTE — Patient Instructions (Signed)

## 2021-04-30 ENCOUNTER — Ambulatory Visit (INDEPENDENT_AMBULATORY_CARE_PROVIDER_SITE_OTHER): Payer: Managed Care, Other (non HMO) | Admitting: Family Medicine

## 2021-04-30 ENCOUNTER — Telehealth: Payer: Self-pay

## 2021-04-30 ENCOUNTER — Encounter: Payer: Self-pay | Admitting: Family Medicine

## 2021-04-30 NOTE — Progress Notes (Signed)
? ?Subjective: ?[id number] ?PCP: Janet Norlander, DO ?[id number] V Janet [name] is a 26 y.o. female presenting to clinic today for: ? ?1.  Morbid obesity ?We attempted to get Mancel Parsons in her February appointment but this apparently was sent to the wrong insurance and we subsequently sent a prior authorization attempt to the wrong insurance.  She informs me today that it should be going to med impact through [employer] and asked that we resubmit this prior authorization as they did indicate that should cover the medication for her.  She tolerated the 0.25 mg dose without difficulty.  Her weight continues to fluctuate up and down and so she really would like something that will help with this weight issue. ? ? ?ROS: Per HPI ? ?No Known Allergies ?Past Medical History:  ?Diagnosis Date  ? Allergy   ? Anemia   ? Anxiety   ? Chronic kidney disease   ? stage 2 she says   ? GERD (gastroesophageal reflux disease)   ? Headache(784.0)   ? Heart murmur 1996  ? congenital that resolved.  ? Hypothyroidism   ? Sleep apnea   ? Thyroid disease   ? Hashimotos   ? ? ?Current Outpatient Medications:  ?  acetaminophen (TYLENOL) 650 MG CR tablet, Take 650 mg by mouth every 8 (eight) hours as needed (for menstrual pain/cramps)., Disp: , Rfl:  ?  dicyclomine (BENTYL) 10 MG capsule, Take 1 capsule (10 mg total) by mouth 2 (two) times daily before a meal., Disp: 60 capsule, Rfl: 5 ?  ergocalciferol (VITAMIN D2) 1.25 MG (50000 UT) capsule, Take 50,000 Units by mouth once a week., Disp: , Rfl:  ?  escitalopram (LEXAPRO) 20 MG tablet, Take 1 tablet (20 mg total) by mouth at bedtime., Disp: 90 tablet, Rfl: 3 ?  ferrous sulfate 324 (65 Fe) MG TBEC, Take 1 tablet (324 mg total) by mouth daily. (Patient taking differently: Take 324 mg by mouth at bedtime.), Disp: 90 tablet, Rfl: 3 ?  furosemide (LASIX) 20 MG tablet, Take 2 tablets (40 mg total) by mouth every other day. (Patient taking differently: Take 20-40 mg by mouth daily.), Disp: 90 tablet, Rfl: 3 ?   ibuprofen (ADVIL) 200 MG tablet, Take 400-600 mg by mouth every 6 (six) hours as needed for headache., Disp: , Rfl:  ?  levothyroxine (SYNTHROID) 100 MCG tablet, Take 1 tablet (100 mcg total) by mouth daily before breakfast., Disp: 90 tablet, Rfl: 1 ?  levothyroxine (SYNTHROID) 112 MCG tablet, Take 1 tablet (112 mcg total) by mouth daily., Disp: 90 tablet, Rfl: 1 ?  metoCLOPramide (REGLAN) 5 MG tablet, Take 5 mg by mouth 4 (four) times daily., Disp: , Rfl:  ?  norethindrone-ethinyl estradiol-FE (LOESTRIN FE) 1-20 MG-MCG tablet, Take 1 tablet by mouth daily., Disp: 84 tablet, Rfl: 4 ?  omeprazole (PRILOSEC) 20 MG capsule, Take 1 capsule (20 mg total) by mouth daily., Disp: 90 capsule, Rfl: 3 ?  ondansetron (ZOFRAN) 4 MG tablet, Take 1 tablet (4 mg total) by mouth every 8 (eight) hours as needed for nausea or vomiting., Disp: 30 tablet, Rfl: 0 ?  vitamin B-12 (CYANOCOBALAMIN) 1000 MCG tablet, Take 1,000 mcg by mouth daily., Disp: , Rfl:  ?  doxycycline (VIBRA-TABS) 100 MG tablet, Take 100 mg by mouth 2 (two) times daily. (Patient not taking: Reported on 04/30/2021), Disp: , Rfl:  ?  metoCLOPramide (REGLAN) 5 MG tablet, Take 1 tablet (5 mg total) by mouth 4 (four) times daily -  before meals and at bedtime.,  Disp: 120 tablet, Rfl: 1 ?Social History  ? ?Socioeconomic History  ? Marital status: Single  ?  Spouse name: Not on file  ? Number of children: Not on file  ? Years of education: Not on file  ? Highest education level: Not on file  ?Occupational History  ? 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 Smoking status: Some Days  ?  Packs/day: 0.50  ?  Years: 2.00  ?  Pack years: 1.00  ?  Types: Cigarettes  ? Smokeless tobacco: Never  ?Vaping Use  ? Vaping Use: Never used  ?Substance and Sexual Activity  ? Alcohol use: Not Currently  ?  Comment: rare  ? Drug use: No  ? Sexual activity: Not Currently  ?  Birth control/protection: None, Pill  ?Other Topics Concern  ? Not on file  ?Social History  Narrative  ? Not on file  ? ?Social Determinants of Health  ? ?Financial Resource Strain: Not on file  ?Food Insecurity: Not on file  ?Transportation Needs: Not on file  ?Physical Activity: Not on file  ?Stress: Not on file  ?Social Connections: Not on file  ?Intimate Partner Violence: Not on file  ? ?Family History  ?Problem Relation Age of Onset  ? Heart attack Maternal Grandmother   ? Heart Problems Maternal Grandfather   ? Heart attack Maternal Grandfather   ? Cancer Paternal Grandmother   ? Cancer Paternal Grandfather   ? Diabetes Mother   ? Other Mother   ?     gastropresis  ? Anxiety disorder Mother   ? Other Father   ?     syncope-has a pacemaker  ? COPD Father   ? ? ?Objective: ?Office vital signs reviewed. ?BP 137/88   Pulse 75   Temp 98.2 ?F (36.8 ?C)   Ht 5\' 7"  (1.702 m)   Wt 298 lb 9.6 oz (135.4 kg)   SpO2 95%   BMI 46.77 kg/m?  ? ?Physical Examination:  ?General: Awake, alert, morbidly obese, No acute distress ?HEENT: Sclera white.  Moist mucous membranes ?Cardio: regular rate and rhythm, S1S2 heard, no murmurs appreciated ?Pulm: clear to auscultation bilaterally, no wheezes, rhonchi or rales; normal work of breathing on room air ? ?Assessment/ Plan: ?26 y.o. female  ? ?Morbid obesity ([place]) ? ?Prior authorization has been resubmitted and we are waiting approval.  I see no barriers to her starting 0.5 mg Wegovy since she tolerated the 0.25 without difficulty.  Would like to see her back in 3 to 4 months for weight recheck, sooner if concerns arise ? ?No orders of the defined types were placed in this encounter. ? ?No orders of the defined types were placed in this encounter. ? ? ? ?Janet Norlander, DO ?[place] ?[phone] ? ? ?

## 2021-04-30 NOTE — Telephone Encounter (Signed)
Janet [name] (Key: [id number]) ?Rx #: [id number] ?Wegovy 0.5MG /0.5ML auto-injectors ?  ?Form ?Med[employer] ePA Form 2017 NCPDP ?Created ?1 hour ago ?Sent to Plan ?5 minutes ago ?Plan Response ?5 minutes ago ?Submit Clinical Questions ?less than a minute ago ?Determination ?Wait for Determination ?Please wait for MedImp[employer] 2017 to return a determination. ?

## 2021-05-03 NOTE — Addendum Note (Signed)
Addended by: Raliegh Ip on: 05/03/2021 05:05 PM ? ? Modules accepted: Orders ? ?

## 2021-05-03 NOTE — Telephone Encounter (Signed)
done

## 2021-05-03 NOTE — Telephone Encounter (Signed)
Right now her ins is requiring she has proof of previous weight loss failure attempt (like through weight watchers, strict workout regimen/ nutritionist visits, etc).  Does she have any of this?  If not, I can refer to healthy weight and wellness or write a prescription for weight watchers or nutrisystem. Sometimes ins will cover this. ?

## 2021-05-03 NOTE — Telephone Encounter (Signed)
Janet [name] (Key: [id number]) ?Rx #: [id number] ?Reagen.Frieze 0.5MG/0.5ML auto-injectors ?  ?Form ?Med[employer] ePA Form 2017 NCPDP ?Created ?3 days ago ?Sent to Plan ?3 days ago ?Plan Response ?3 days ago ?Submit Clinical Questions ?3 days ago ?Determination ?Unfavorable ?22 hours ago ?eAppeal Submitted ?eAppeal Determination ?Your prior authorization request has been denied. ?COMPLETE E-APPEAL ?Your request for prior authorization was denied, but an Electronic Appeal is available for your patient. Complete the questions in the Appeal section at the bottom of this page to pursue the appeal. For assistance, contact our support team at [phone]. ? ?Message from plan: This request has not been approved. Based on the information submitted for review, you did not meet our guideline rules for the requested drug. In order for your request to be approved, your provider would need to show that you have met the guideline rules below. The details below are written in medical language. If you have questions, please contact your provider. In some cases, the requested medication or alternatives offered may have additional approval requirements. Our guideline named ANTI-OBESITY [employer] [facility]) requires the following rule(s) be met for approval: 1. You have chart notes documenting evidence of active enrollment in an exercise and caloric reduction program or a weight loss/behavioral modification program for 3-6 months and failed to lose 5% or more of body weight. Your doctor requested this medication for weight loss or weight management. We do not have information showing that you meet the conditions listed above. This is why your request is denied. Please work with your doctor to use a different medication or get [place] more information if it will allow [place] to approve this request. A written notification letter will follow with additional details. ?

## 2021-05-03 NOTE — Telephone Encounter (Signed)
PATIENT WOULD LIKE REFERRAL TO [facility] ?

## 2021-07-25 ENCOUNTER — Encounter (INDEPENDENT_AMBULATORY_CARE_PROVIDER_SITE_OTHER): Payer: Self-pay

## 2021-07-25 DIAGNOSIS — Z0289 Encounter for other administrative examinations: Secondary | ICD-10-CM

## 2021-07-27 ENCOUNTER — Other Ambulatory Visit: Payer: Managed Care, Other (non HMO)

## 2021-07-27 ENCOUNTER — Other Ambulatory Visit: Payer: Self-pay

## 2021-07-27 DIAGNOSIS — N182 Chronic kidney disease, stage 2 (mild): Secondary | ICD-10-CM

## 2021-07-27 DIAGNOSIS — E063 Autoimmune thyroiditis: Secondary | ICD-10-CM

## 2021-07-28 LAB — IRON,TIBC AND FERRITIN PANEL
Ferritin: 24 ng/mL (ref 15–150)
Iron Saturation: 22 % (ref 15–55)
Iron: 61 ug/dL (ref 27–159)
Total Iron Binding Capacity: 280 ug/dL (ref 250–450)
UIBC: 219 ug/dL (ref 131–425)

## 2021-07-28 LAB — CBC WITH DIFFERENTIAL/PLATELET
Basophils Absolute: 0.1 10*3/uL (ref 0.0–0.2)
Basos: 1 %
EOS (ABSOLUTE): 0.1 10*3/uL (ref 0.0–0.4)
Eos: 1 %
Hematocrit: 39.1 % (ref 34.0–46.6)
Hemoglobin: 13.2 g/dL (ref 11.1–15.9)
Immature Grans (Abs): 0 10*3/uL (ref 0.0–0.1)
Immature Granulocytes: 0 %
Lymphocytes Absolute: 3.6 10*3/uL — ABNORMAL HIGH (ref 0.7–3.1)
Lymphs: 34 %
MCH: 30.5 pg (ref 26.6–33.0)
MCHC: 33.8 g/dL (ref 31.5–35.7)
MCV: 90 fL (ref 79–97)
Monocytes Absolute: 0.6 10*3/uL (ref 0.1–0.9)
Monocytes: 6 %
Neutrophils Absolute: 6.1 10*3/uL (ref 1.4–7.0)
Neutrophils: 58 %
Platelets: 323 10*3/uL (ref 150–450)
RBC: 4.33 x10E6/uL (ref 3.77–5.28)
RDW: 12 % (ref 11.7–15.4)
WBC: 10.5 10*3/uL (ref 3.4–10.8)

## 2021-07-28 LAB — RENAL FUNCTION PANEL
Albumin: 4 g/dL (ref 4.0–5.0)
BUN/Creatinine Ratio: 11 (ref 9–23)
BUN: 10 mg/dL (ref 6–20)
CO2: 22 mmol/L (ref 20–29)
Calcium: 8.8 mg/dL (ref 8.7–10.2)
Chloride: 102 mmol/L (ref 96–106)
Creatinine, Ser: 0.91 mg/dL (ref 0.57–1.00)
Glucose: 86 mg/dL (ref 70–99)
Phosphorus: 3.9 mg/dL (ref 3.0–4.3)
Potassium: 4 mmol/L (ref 3.5–5.2)
Sodium: 138 mmol/L (ref 134–144)
eGFR: 89 mL/min/{1.73_m2} (ref >59)

## 2021-07-28 LAB — PROTEIN / CREATININE RATIO, URINE
Creatinine, Urine: 96 mg/dL
Protein, Ur: 8.4 mg/dL
Protein/Creat Ratio: 88 mg/g creat (ref 0–200)

## 2021-07-28 LAB — T4, FREE: Free T4: 1.26 ng/dL (ref 0.82–1.77)

## 2021-07-28 LAB — TSH: TSH: 0.616 u[IU]/mL (ref 0.450–4.500)

## 2021-07-28 LAB — VITAMIN D 25 HYDROXY (VIT D DEFICIENCY, FRACTURES): Vit D, 25-Hydroxy: 22 ng/mL — ABNORMAL LOW (ref 30.0–100.0)

## 2021-08-01 ENCOUNTER — Encounter: Payer: Self-pay | Admitting: Nurse Practitioner

## 2021-08-01 ENCOUNTER — Ambulatory Visit (INDEPENDENT_AMBULATORY_CARE_PROVIDER_SITE_OTHER): Payer: Managed Care, Other (non HMO) | Admitting: Family Medicine

## 2021-08-01 ENCOUNTER — Ambulatory Visit (INDEPENDENT_AMBULATORY_CARE_PROVIDER_SITE_OTHER): Payer: Managed Care, Other (non HMO) | Admitting: Nurse Practitioner

## 2021-08-01 ENCOUNTER — Encounter: Payer: Self-pay | Admitting: Family Medicine

## 2021-08-01 VITALS — BP 122/65 | HR 93 | Ht 67.0 in | Wt 294.0 lb

## 2021-08-01 VITALS — BP 109/67 | HR 74 | Temp 97.1°F | Ht 67.0 in | Wt 293.4 lb

## 2021-08-01 DIAGNOSIS — H6693 Otitis media, unspecified, bilateral: Secondary | ICD-10-CM | POA: Diagnosis not present

## 2021-08-01 DIAGNOSIS — J069 Acute upper respiratory infection, unspecified: Secondary | ICD-10-CM

## 2021-08-01 DIAGNOSIS — E063 Autoimmune thyroiditis: Secondary | ICD-10-CM | POA: Diagnosis not present

## 2021-08-01 DIAGNOSIS — E038 Other specified hypothyroidism: Secondary | ICD-10-CM | POA: Diagnosis not present

## 2021-08-01 MED ORDER — AMOXICILLIN 875 MG PO TABS: 875.0000 mg | ORAL_TABLET | Freq: Two times a day (BID) | ORAL | 0 refills | Status: AC

## 2021-08-01 MED ORDER — LEVOTHYROXINE SODIUM 112 MCG PO TABS: 112.0000 ug | ORAL_TABLET | Freq: Every day | ORAL | 1 refills | Status: DC

## 2021-08-01 MED ORDER — VITAMIN D (ERGOCALCIFEROL) 1.25 MG (50000 UNIT) PO CAPS: 50000.0000 [IU] | ORAL_CAPSULE | ORAL | 1 refills | Status: DC

## 2021-08-01 MED ORDER — CHLORPHEN-PE-ACETAMINOPHEN 4-10-325 MG PO TABS: 1.0000 | ORAL_TABLET | Freq: Four times a day (QID) | ORAL | 0 refills | Status: DC | PRN

## 2021-08-01 MED ORDER — LEVOTHYROXINE SODIUM 100 MCG PO TABS: 100.0000 ug | ORAL_TABLET | Freq: Every day | ORAL | 1 refills | Status: DC

## 2021-08-01 NOTE — Patient Instructions (Signed)

## 2021-08-01 NOTE — Progress Notes (Signed)
Endocrinology Follow Up Note                                         08/01/2021, 2:42 PM  Subjective:   Subjective    Janet [name] is a 26 y.o.-year-old female patient being seen in follow up after being seen in consultation for hypothyroidism referred by [name], DO.   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   Hypothyroidism    Sleep apnea    Thyroid disease    Hashimotos     Past Surgical History:  Procedure Laterality Date   BIOPSY  05/09/2020   Procedure: BIOPSY;  Surgeon: Harvel Quale, MD;  Location: [facility];  Service: Gastroenterology;;   CHOLECYSTECTOMY N/A 05/15/2020   Procedure: LAPAROSCOPIC CHOLECYSTECTOMY;  Surgeon: Virl Cagey, MD;  Location: [facility];  Service: General;  Laterality: N/A;   ESOPHAGOGASTRODUODENOSCOPY (EGD) WITH PROPOFOL N/A 05/09/2020   Procedure: ESOPHAGOGASTRODUODENOSCOPY (EGD) WITH PROPOFOL;  Surgeon: Harvel Quale, MD;  Location: [facility];  Service: Gastroenterology;  Laterality: N/A;  AM   THYROIDECTOMY N/A 07/01/2017   Procedure: TOTAL THYROIDECTOMY;  Surgeon: [name], MD;  Location: [facility];  Service: General;  Laterality: N/A;   TONSILLECTOMY AND ADENOIDECTOMY Bilateral 01/2011   TOTAL THYROIDECTOMY     Dr. [name] 07-01-17    Social History   Socioeconomic History   Marital status: Single    Spouse name: Not on file   Number of children: Not on file   Years of education: Not on file   Highest education level: Not on file  Occupational History   Occupation: Scientist, water quality    Comment: Customer service  Tobacco Use   Smoking status: Some Days    Packs/day: 0.50    Years: 2.00    Total pack years: 1.00    Types: Cigarettes   Smokeless  tobacco: Never  Vaping Use   Vaping Use: Never used  Substance and Sexual Activity   Alcohol use: Not Currently    Comment: rare   Drug use: No   Sexual activity: Not Currently    Birth control/protection: None, Pill  Other Topics Concern   Not on file  Social History Narrative   Not on file   Social Determinants of Health   Financial Resource Strain: Medium Risk (04/07/2019)   Overall Financial Resource Strain (CARDIA)    Difficulty of Paying Living Expenses: Somewhat hard  Food Insecurity: No Food Insecurity (04/07/2019)   Hunger Vital Sign    Worried About Running Out of Food in the Last Year: Never true    Ran Out of Food in the Last Year: Never true  Transportation Needs: No Transportation Needs (04/07/2019)   PRAPARE - Transportation  Lack of Transportation (Medical): No    Lack of Transportation (Non-Medical): No  Physical Activity: Insufficiently Active (04/07/2019)   Exercise Vital Sign    Days of Exercise per Week: 4 days    Minutes of Exercise per Session: 30 min  Stress: Stress Concern Present (04/07/2019)   [place]    Feeling of Stress : Rather much  Social Connections: Moderately Isolated (04/07/2019)   Social Connection and Isolation Panel [NHANES]    Frequency of Communication with Friends and Family: More than three times a week    Frequency of Social Gatherings with Friends and Family: Once a week    Attends Religious Services: 1 to 4 times per year    Active Member of [employer] or Organizations: No    Attends Music therapist: Never    Marital Status: Never married    Family History  Problem Relation Age of Onset   Heart attack Maternal Grandmother    Heart Problems Maternal Grandfather    Heart attack Maternal Grandfather    Cancer Paternal Grandmother    Cancer Paternal Grandfather    Diabetes Mother    Other Mother        gastropresis   Anxiety disorder Mother     Other Father        syncope-has a pacemaker   COPD Father     Outpatient Encounter Medications as of 08/01/2021  Medication Sig   Vitamin D, Ergocalciferol, (DRISDOL) 1.25 MG (50000 UNIT) CAPS capsule Take 1 capsule (50,000 Units total) by mouth every 7 (seven) days.   dicyclomine (BENTYL) 10 MG capsule Take 1 capsule (10 mg total) by mouth 2 (two) times daily before a meal.   escitalopram (LEXAPRO) 20 MG tablet Take 1 tablet (20 mg total) by mouth at bedtime.   ferrous sulfate 324 (65 Fe) MG TBEC Take 1 tablet (324 mg total) by mouth daily. (Patient taking differently: Take 324 mg by mouth at bedtime.)   furosemide (LASIX) 20 MG tablet Take 2 tablets (40 mg total) by mouth every other day. (Patient taking differently: Take 20-40 mg by mouth daily.)   ibuprofen (ADVIL) 200 MG tablet Take 400-600 mg by mouth every 6 (six) hours as needed for headache.   levothyroxine (SYNTHROID) 100 MCG tablet Take 1 tablet (100 mcg total) by mouth daily before breakfast.   levothyroxine (SYNTHROID) 112 MCG tablet Take 1 tablet (112 mcg total) by mouth daily.   metoCLOPramide (REGLAN) 5 MG tablet Take 1 tablet (5 mg total) by mouth 4 (four) times daily -  before meals and at bedtime.   metoCLOPramide (REGLAN) 5 MG tablet Take 5 mg by mouth 4 (four) times daily.   norethindrone-ethinyl estradiol-FE (LOESTRIN FE) 1-20 MG-MCG tablet Take 1 tablet by mouth daily.   omeprazole (PRILOSEC) 20 MG capsule Take 1 capsule (20 mg total) by mouth daily.   ondansetron (ZOFRAN) 4 MG tablet Take 1 tablet (4 mg total) by mouth every 8 (eight) hours as needed for nausea or vomiting.   vitamin B-12 (CYANOCOBALAMIN) 1000 MCG tablet Take 1,000 mcg by mouth daily.   [DISCONTINUED] acetaminophen (TYLENOL) 650 MG CR tablet Take 650 mg by mouth every 8 (eight) hours as needed (for menstrual pain/cramps).   [DISCONTINUED] doxycycline (VIBRA-TABS) 100 MG tablet Take 100 mg by mouth 2 (two) times daily. (Patient not taking: Reported on  04/30/2021)   [DISCONTINUED] ergocalciferol (VITAMIN D2) 1.25 MG (50000 UT) capsule Take 50,000 Units by mouth once a  week.   [DISCONTINUED] levothyroxine (SYNTHROID) 100 MCG tablet Take 1 tablet (100 mcg total) by mouth daily before breakfast.   [DISCONTINUED] levothyroxine (SYNTHROID) 112 MCG tablet Take 1 tablet (112 mcg total) by mouth daily.   No facility-administered encounter medications on file as of 08/01/2021.    ALLERGIES: No Known Allergies VACCINATION STATUS: Immunization History  Administered Date(s) Administered   DTaP 08/14/1994, 10/09/1994, 12/11/1994, 09/23/1995, 08/08/1999   HPV Quadrivalent 10/02/2005, 12/02/2005, 07/31/2009   Hepatitis B [birth year] 1994, 08/14/1994, 12/11/1994   HiB (PRP-OMP) 08/14/1994, 10/09/1994, 12/11/1994, 09/23/1995   IPV 08/14/1994, 10/09/1994, 12/11/1994, 08/08/1999   Influenza,inj,Quad PF,6+ Mos 10/26/2012, 01/21/2014, 11/25/2014, 10/16/2016, 10/03/2017, 08/31/2018, 11/22/2019, 11/14/2020   Influenza-Unspecified 08/31/2018   MMR 06/17/1995, 08/08/1999   Moderna Sars-Covid-2 Vaccination 05/24/2019, 06/21/2019, 01/25/2020   Td 07/31/2009, 11/22/2019   Tdap 07/31/2009, 01/22/2016   Varicella 09/23/1995     HPI   Janet [name]  is a patient with the above medical history. she was diagnosed with Hashimoto's thyroiditis complicated by large goiter at approximate age of 30 years, which required total thyroidectomy and subsequent initiation of thyroid hormone replacement. she was given various doses of thyroid hormone over the years, currently on Levothyroxine 212 micrograms. she reports compliance to this medication:  Taking it daily on empty stomach  with water, separated by >30 minutes before breakfast and other medications, and by at least 4 hours from calcium, iron, PPIs, multivitamins .  I reviewed patient's thyroid tests:  Lab Results  Component Value Date   TSH 0.616 07/27/2021   TSH 6.440 (H) 04/16/2021   TSH 60.500 (H) 02/28/2021   TSH  301.000 (H) 12/20/2019   TSH 209.200 (H) 10/03/2017   TSH 9.04 (H) 04/16/2017   TSH 13.34 (H) 12/25/2016   TSH 10.57 (H) 10/16/2016   TSH 12.48 (H) 02/26/2016   TSH 12.42 (H) 11/02/2015   FREET4 1.26 07/27/2021   FREET4 1.04 04/16/2021   FREET4 0.80 (L) 02/28/2021   FREET4 0.56 (L) 04/16/2017   FREET4 0.64 12/25/2016   FREET4 0.71 10/16/2016   FREET4 0.83 02/26/2016   FREET4 0.48 (L) 11/02/2015   FREET4 0.88 08/25/2015   FREET4 0.77 07/12/2015     Pt describes: - weight gain - fatigue - fluid retention - mental fog  Pt denies feeling nodules in neck, hoarseness, dysphagia/odynophagia, SOB with lying down.  she denies family history of thyroid disorders.  No family history of thyroid cancer.  No history of radiation therapy to head or neck.  No recent use of iodine supplements.  Denies use of Biotin containing supplements.  I reviewed her chart and she also has a history of anemia, CKD, GERD, OSA.   ROS:  Constitutional: + minimally fluctuating weight, + fatigue-improving, no subjective hyperthermia, no subjective hypothermia Eyes: no blurry vision, no xerophthalmia ENT: no sore throat, no nodules palpated in throat, no dysphagia/odynophagia, no hoarseness Cardiovascular: no chest pain, no SOB, no palpitations, intermittent generalized swelling Respiratory: no cough, no SOB Gastrointestinal: no nausea/vomiting/diarrhea Musculoskeletal: no muscle/joint aches Skin: no rashes Neurological: no tremors, no numbness, no tingling, no dizziness Psychiatric: no depression, no anxiety   Objective:   Objective     BP 122/65   Pulse 93   Ht _0  (1.702 m)   Wt 294 lb (133.4 kg)   BMI 46.05 kg/m  Wt Readings from Last 3 Encounters:  08/01/21 294 lb (133.4 kg)  04/30/21 298 lb 9.6 oz (135.4 kg)  04/25/21 295 lb 6.4 oz (134 kg)    BP Readings from  Last 3 Encounters:  08/01/21 122/65  04/30/21 137/88  04/25/21 110/76      Physical Exam-  Limited  Constitutional:  Body mass index is 46.05 kg/m. , not in acute distress, normal state of mind Eyes:  EOMI, no exophthalmos Neck: Supple Thyroid: No gross goiter- surgical scar from previous total thyroidectomy Cardiovascular: RRR, no murmurs, rubs, or gallops, generalized edema Respiratory: Adequate breathing efforts, no crackles, rales, rhonchi, or wheezing Musculoskeletal: no gross deformities, strength intact in all four extremities, no gross restriction of joint movements Skin:  no rashes, no hyperemia Neurological: no tremor with outstretched hands   CMP ( most recent) CMP     Component Value Date/Time   NA 138 07/27/2021 1402   K 4.0 07/27/2021 1402   CL 102 07/27/2021 1402   CO2 22 07/27/2021 1402   GLUCOSE 86 07/27/2021 1402   GLUCOSE 107 (H) 05/08/2020 0912   BUN 10 07/27/2021 1402   CREATININE 0.91 07/27/2021 1402   CALCIUM 8.8 07/27/2021 1402   PROT 7.0 02/28/2021 1457   ALBUMIN 4.0 07/27/2021 1402   AST 12 02/28/2021 1457   ALT 10 02/28/2021 1457   ALKPHOS 112 02/28/2021 1457   BILITOT 0.4 02/28/2021 1457   GFRNONAA >60 05/08/2020 0912   GFRAA 85 12/20/2019 1154     Diabetic Labs (most recent): Lab Results  Component Value Date   HGBA1C 5.1 02/28/2021   HGBA1C 5.4 11/22/2019     Lipid Panel ( most recent) Lipid Panel     Component Value Date/Time   CHOL 223 (H) 02/28/2021 1457   TRIG 179 (H) 02/28/2021 1457   HDL 34 (L) 02/28/2021 1457   CHOLHDL 6.6 (H) 02/28/2021 1457   LDLCALC 156 (H) 02/28/2021 1457   LABVLDL 33 02/28/2021 1457       Lab Results  Component Value Date   TSH 0.616 07/27/2021   TSH 6.440 (H) 04/16/2021   TSH 60.500 (H) 02/28/2021   TSH 301.000 (H) 12/20/2019   TSH 209.200 (H) 10/03/2017   TSH 9.04 (H) 04/16/2017   TSH 13.34 (H) 12/25/2016   TSH 10.57 (H) 10/16/2016   TSH 12.48 (H) 02/26/2016   TSH 12.42 (H) 11/02/2015   FREET4 1.26 07/27/2021   FREET4 1.04 04/16/2021   FREET4 0.80 (L) 02/28/2021   FREET4  0.56 (L) 04/16/2017   FREET4 0.64 12/25/2016   FREET4 0.71 10/16/2016   FREET4 0.83 02/26/2016   FREET4 0.48 (L) 11/02/2015   FREET4 0.88 08/25/2015   FREET4 0.77 07/12/2015      Latest Reference Range & Units 10/03/17 13:37 12/20/19 11:54 02/28/21 14:57 04/16/21 14:28 07/27/21 14:02  TSH 0.450 - 4.500 uIU/mL 209.200 (H) 301.000 (H) 60.500 (H) 6.440 (H) 0.616  T4,Free(Direct) 0.82 - 1.77 ng/dL   0.80 (L) 1.04 1.26  Thyroxine (T4) 4.5 - 12.0 ug/dL 2.2 (L) 0.4 (LL)     Free Thyroxine Index 1.2 - 4.9  0.4 (L) 0.1 (L)     T3 Uptake Ratio 24 - 39 % 18 (L) 15 (L)     (LL): Data is critically low (H): Data is abnormally high (L): Data is abnormally low Assessment & Plan:   ASSESSMENT / PLAN:  1. Hypothyroidism-S/P total thyroidectomy for goiter r/t Hashimoto's  Her most recent thyroid labs are consistent with appropriate hormone replacement.  She is advised to continue her Levothyroxine 212 mcg (1 of 100 mcg and 1 of 112 mcg tabs) po daily before breakfast.  - We discussed about correct intake of levothyroxine, at fasting, with  water, separated by at least 30 minutes from breakfast, and separated by more than 4 hours from calcium, iron, multivitamins, acid reflux medications (PPIs). -Patient is made aware of the fact that thyroid hormone replacement is needed for life, dose to be adjusted by periodic monitoring of thyroid function tests.  -Her thyroid ultrasound shows no residual thyroid tissue after total thyroidectomy.  No need for further imaging at this time.     I spent 21 minutes in the care of the patient today including review of labs from Thyroid Function, CMP, and other relevant labs ; imaging/biopsy records (current and previous including abstractions from other facilities); face-to-face time discussing  her lab results and symptoms, medications doses, her options of short and long term treatment based on the latest standards of care / guidelines;   and documenting the  encounter.  Janet [name]  participated in the discussions, expressed understanding, and voiced agreement with the above plans.  All questions were answered to her satisfaction. she is encouraged to contact clinic should she have any questions or concerns prior to her return visit.   FOLLOW UP PLAN:  Return in about 3 months (around 11/01/2021) for Thyroid follow up, Previsit labs.  Rayetta Pigg, [facility] [address] [place], [place] [address] Phone: [phone] Fax: [phone]  08/01/2021, 2:42 PM

## 2021-08-01 NOTE — Progress Notes (Signed)
Acute Office Visit  Subjective:     Patient ID: Janet [name], female    DOB: May 11, 1995, 26 y.o.   MRN: [id number]  Chief Complaint  Patient presents with   Facial Pain   Nasal Congestion    HPI Patient is in today for nasal congestion for 2 week. She reports a worsening of this 5 days ago. cough, congestion, sore, and HA for  She also reports ear congestion and ear pin in the left ear. She denies drainage from her ears. Denies fever, chills, nausea, vomiting, diarrhea, chest pain, or shortness of breath. She has taken mucinex and alka seltzer plus.   ROS      Objective:    BP 109/67   Pulse 74   Temp (!) 97.1 F (36.2 C) (Temporal)   Ht 5\' 7"  (1.702 m)   Wt 293 lb 6 oz (133.1 kg)   SpO2 95%   BMI 45.95 kg/m    Physical Exam Vitals and nursing note reviewed.  Constitutional:      General: She is not in acute distress.    Appearance: She is not toxic-appearing or diaphoretic.  HENT:     Right Ear: Ear canal and external ear normal. Tympanic membrane is erythematous and bulging. Tympanic membrane is not perforated or retracted.     Left Ear: Ear canal and external ear normal. Tympanic membrane is erythematous and bulging. Tympanic membrane is not perforated or retracted.     Nose: Congestion present.     Right Sinus: Maxillary sinus tenderness and frontal sinus tenderness present.     Left Sinus: Maxillary sinus tenderness and frontal sinus tenderness present.     Mouth/Throat:     Mouth: Mucous membranes are moist.     Pharynx: No oropharyngeal exudate, posterior oropharyngeal erythema or uvula swelling.     Comments: Surgical absent tonsils Cardiovascular:     Rate and Rhythm: Normal rate and regular rhythm.     Heart sounds: Normal heart sounds. No murmur heard. Pulmonary:     Effort: Pulmonary effort is normal.     Breath sounds: Normal breath sounds.  Abdominal:     General: Bowel sounds are normal. There is no distension.     Palpations: Abdomen is  soft.     Tenderness: There is no abdominal tenderness. There is no guarding or rebound.  Musculoskeletal:     Right lower leg: No edema.     Left lower leg: No edema.  Skin:    General: Skin is warm and dry.  Neurological:     General: No focal deficit present.     Mental Status: She is alert and oriented to person, place, and time.  Psychiatric:        Mood and Affect: Mood normal.        Behavior: Behavior normal.     No results found for any visits on 08/01/21.      Assessment & Plan:   Janet [name] was seen today for facial pain and nasal congestion.  Diagnoses and all orders for this visit:  Acute bilateral otitis media Amoxicillin as below.  -     amoxicillin (AMOXIL) 875 MG tablet; Take 1 tablet (875 mg total) by mouth 2 (two) times daily for 10 days.  Upper respiratory tract infection, unspecified type Norel as below. Do not take tylenol with Norel. Discussed symptomatic care and return precautions.  -     Chlorphen-PE-Acetaminophen 4-10-325 MG TABS; Take 1 tablet by mouth every 6 (six) hours  as needed.   Return to office for new or worsening symptoms, or if symptoms persist.   The patient indicates understanding of these issues and agrees with the plan.   Gabriel Earing, FNP

## 2021-08-15 ENCOUNTER — Encounter (INDEPENDENT_AMBULATORY_CARE_PROVIDER_SITE_OTHER): Payer: Self-pay

## 2021-08-15 ENCOUNTER — Ambulatory Visit (INDEPENDENT_AMBULATORY_CARE_PROVIDER_SITE_OTHER): Payer: Managed Care, Other (non HMO) | Admitting: Family Medicine

## 2021-08-15 ENCOUNTER — Encounter (INDEPENDENT_AMBULATORY_CARE_PROVIDER_SITE_OTHER): Payer: Self-pay | Admitting: Family Medicine

## 2021-08-15 VITALS — BP 113/81 | HR 81 | Temp 97.6°F | Ht 67.0 in | Wt 284.0 lb

## 2021-08-15 DIAGNOSIS — R0602 Shortness of breath: Secondary | ICD-10-CM | POA: Diagnosis not present

## 2021-08-15 DIAGNOSIS — G4730 Sleep apnea, unspecified: Secondary | ICD-10-CM | POA: Diagnosis not present

## 2021-08-15 DIAGNOSIS — F32A Depression, unspecified: Secondary | ICD-10-CM | POA: Insufficient documentation

## 2021-08-15 DIAGNOSIS — E559 Vitamin D deficiency, unspecified: Secondary | ICD-10-CM | POA: Insufficient documentation

## 2021-08-15 DIAGNOSIS — D649 Anemia, unspecified: Secondary | ICD-10-CM | POA: Insufficient documentation

## 2021-08-15 DIAGNOSIS — D508 Other iron deficiency anemias: Secondary | ICD-10-CM

## 2021-08-15 DIAGNOSIS — E063 Autoimmune thyroiditis: Secondary | ICD-10-CM

## 2021-08-15 DIAGNOSIS — R5383 Other fatigue: Secondary | ICD-10-CM | POA: Insufficient documentation

## 2021-08-15 DIAGNOSIS — Z1331 Encounter for screening for depression: Secondary | ICD-10-CM

## 2021-08-15 DIAGNOSIS — F3289 Other specified depressive episodes: Secondary | ICD-10-CM

## 2021-08-15 DIAGNOSIS — N182 Chronic kidney disease, stage 2 (mild): Secondary | ICD-10-CM | POA: Diagnosis not present

## 2021-08-15 DIAGNOSIS — Z6841 Body Mass Index (BMI) 40.0 and over, adult: Secondary | ICD-10-CM | POA: Insufficient documentation

## 2021-08-15 DIAGNOSIS — E669 Obesity, unspecified: Secondary | ICD-10-CM

## 2021-08-16 LAB — LIPID PANEL WITH LDL/HDL RATIO
Cholesterol, Total: 184 mg/dL (ref 100–199)
HDL: 36 mg/dL — ABNORMAL LOW (ref >39)
LDL Chol Calc (NIH): 122 mg/dL — ABNORMAL HIGH (ref 0–99)
LDL/HDL Ratio: 3.4 ratio — ABNORMAL HIGH (ref 0.0–3.2)
Triglycerides: 147 mg/dL (ref 0–149)
VLDL Cholesterol Cal: 26 mg/dL (ref 5–40)

## 2021-08-16 LAB — HEMOGLOBIN A1C
Est. average glucose Bld gHb Est-mCnc: 111 mg/dL
Hgb A1c MFr Bld: 5.5 % (ref 4.8–5.6)

## 2021-08-16 LAB — VITAMIN B12: Vitamin B-12: 506 pg/mL (ref 232–1245)

## 2021-08-16 LAB — INSULIN, RANDOM: INSULIN: 13.8 u[IU]/mL (ref 2.6–24.9)

## 2021-08-16 LAB — FOLATE: Folate: 6.3 ng/mL (ref >3.0)

## 2021-08-28 NOTE — Progress Notes (Signed)
Chief Complaint:   OBESITY Janet [name] (MR# [id number]) is a 26 y.o. female who presents for evaluation and treatment of obesity and related comorbidities. Current BMI is Body mass index is 44.48 kg/m. Janet [name] has been struggling with her weight for many years and has been unsuccessful in either losing weight, maintaining weight loss, or reaching her healthy weight goal.  Janet [name] has been overweight since middle school, with thyroid disease.  She has a family history of obesity.  She lives with her parents.  she would like a 100 pound weight loss.  Works Associate Professor for [employer].  Typically skips breakfast.  Has juice/snacks at work.  Snacks on chips, occasional rice pudding, and pie.  Took [facility] for 4 weeks since April with short-term success.  Janet [name] is currently in the action stage of change and ready to dedicate time achieving and maintaining a healthier weight. Janet [name] is interested in becoming our patient and working on intensive lifestyle modifications including (but not limited to) diet and exercise for weight loss.  Janet [name]'s habits were reviewed today and are as follows: Her family eats meals together, she thinks her family will eat healthier with her, her desired weight loss is 104 lbs, she has been heavy most of her life, she started gaining weight in 6th grade, her heaviest weight ever was 350 pounds, she is a picky eater and doesn't like to eat healthier foods, she snacks frequently in the evenings, she skips meals frequently, she is frequently drinking liquids with calories, she frequently makes poor food choices, and she frequently eats larger portions than normal.  Depression Screen Oral's Food and Mood (modified PHQ-9) score was 10.     08/15/2021    9:54 AM  Depression screen PHQ 2/9  Decreased Interest 2  Down, Depressed, Hopeless 1  PHQ - 2 Score 3  Altered sleeping 1  Tired, decreased energy 1  Change in appetite 1  Feeling bad or failure about yourself  1   Trouble concentrating 3  Moving slowly or fidgety/restless 0  Suicidal thoughts 0  PHQ-9 Score 10  Difficult doing work/chores Not difficult at all   Subjective:   1. Other fatigue Janet [name] admits to daytime somnolence and admits to waking up still tired. Patient has a history of symptoms of daytime fatigue, morning fatigue, and morning headache. Janet [name] generally gets 5 or 7 hours of sleep per night, and states that she has generally restful sleep. Snoring is present. Apneic episodes are not present. Epworth Sleepiness Score is 9.   2. SOBOE (shortness of breath on exertion) Janet [name] notes increasing shortness of breath with exercising and seems to be worsening over time with weight gain. She notes getting out of breath sooner with activity than she used to. This has not gotten worse recently. Janet [name] denies shortness of breath at rest or orthopnea.  3. Hashimoto's thyroiditis Janet [name] has improved compliance on levothyroxine per Endocrinology.  She is status post thyroidectomy.  Her TSH improved to 0.6 from 6.44 on last check on 07/27/2021.  4. CKD (chronic kidney disease) stage 2, GFR 60-89 ml/min Janet [name] is managed by [facility].  Most recent GFR was 89, creatinine 0.91 on 07/27/2021.  She is using Lasix 2 tablets daily for edema, and she notes it is helping.   5. Severe sleep apnea Janet [name] usually gets 8 hours of sleep at night.  She is not compliant with her CPAP at night.  She denies air leaks.  6. Vitamin D deficiency Janet [name]  started prescription vitamin D 50,000 units once weekly with her PCP on 08/01/2021.  Her last vitamin D level was low at 22 on 07/27/2021.  7. Other iron deficiency anemia Janet [name] is on ferrous sulfate 324 mg daily.  She is status post iron infusions.  She is on oral contraception to regulate menses.   8. Other depression Janet [name] is on Lexapro 20 mg daily with irritability.  Assessment/Plan:   1. Other fatigue Janet [name] does feel that her weight is causing her energy  to be lower than it should be. Fatigue may be related to obesity, depression or many other causes. Labs will be ordered, and in the meanwhile, Janet [name] will focus on self care including making healthy food choices, increasing physical activity and focusing on stress reduction.  - EKG 12-Lead - Vitamin B12 - Folate - Hemoglobin A1c - Insulin, random - Lipid Panel With LDL/HDL Ratio  2. SOBOE (shortness of breath on exertion) Janet [name] does feel that she gets out of breath more easily that she used to when she exercises. Janet [name]'s shortness of breath appears to be obesity related and exercise induced. She has agreed to work on weight loss and gradually increase exercise to treat her exercise induced shortness of breath. Will continue to monitor closely.  3. Hashimoto's thyroiditis Janet [name] will continue her current thyroid medications.  4. CKD (chronic kidney disease) stage 2, GFR 60-89 ml/min Janet [name] will continue her routine follow-up with Nephrology.  5. Severe sleep apnea Janet [name] will work on her CPAP compliance.  6. Vitamin D deficiency Janet [name] will continue prescription vitamin D per her PCP.  7. Other iron deficiency anemia Janet [name] will continue oral iron daily, and look for improvements with improved menses.   8. Other depression Janet [name] should follow-up with her PCP in regards to any medication adjustments.  9. Obesity, Current BMI 44.5 Janet [name] is currently in the action stage of change and her goal is to continue with weight loss efforts. I recommend Janet [name] begin the structured treatment plan as follows:  She has agreed to the Category 3 Plan.  Exercise goals: Start tracking steps.    Behavioral modification strategies: increasing lean protein intake, decreasing liquid calories, decreasing eating out, no skipping meals, meal planning and cooking strategies, better snacking choices, planning for success, and decreasing junk food.  She was informed of the importance of frequent follow-up visits  to maximize her success with intensive lifestyle modifications for her multiple health conditions. She was informed we would discuss her lab results at her next visit unless there is a critical issue that needs to be addressed sooner. Janet [name] agreed to keep her next visit at the agreed upon time to discuss these results.  Objective:   Blood pressure 113/81, pulse 81, temperature 97.6 F (36.4 C), height 5\' 7"  (1.702 m), weight 284 lb (128.8 kg), SpO2 97 %. Body mass index is 44.48 kg/m.  EKG: Normal sinus rhythm, rate 72 BPM.  Indirect Calorimeter completed today shows a VO2 of 310 and a REE of 2146.  Her calculated basal metabolic rate is 2147 thus her basal metabolic rate is better than expected.  General: Cooperative, alert, well developed, in no acute distress. HEENT: Conjunctivae and lids unremarkable. Cardiovascular: Regular rhythm.  Lungs: Normal work of breathing. Neurologic: No focal deficits.   Lab Results  Component Value Date   CREATININE 0.91 07/27/2021   BUN 10 07/27/2021   NA 138 07/27/2021   K 4.0 07/27/2021   CL 102 07/27/2021   CO2 22 07/27/2021   Lab  Results  Component Value Date   ALT 10 02/28/2021   AST 12 02/28/2021   ALKPHOS 112 02/28/2021   BILITOT 0.4 02/28/2021   Lab Results  Component Value Date   HGBA1C 5.5 08/15/2021   HGBA1C 5.1 02/28/2021   HGBA1C 5.4 11/22/2019   Lab Results  Component Value Date   INSULIN 13.8 08/15/2021   Lab Results  Component Value Date   TSH 0.616 07/27/2021   Lab Results  Component Value Date   CHOL 184 08/15/2021   HDL 36 (L) 08/15/2021   LDLCALC 122 (H) 08/15/2021   TRIG 147 08/15/2021   CHOLHDL 6.6 (H) 02/28/2021   Lab Results  Component Value Date   WBC 10.5 07/27/2021   HGB 13.2 07/27/2021   HCT 39.1 07/27/2021   MCV 90 07/27/2021   PLT 323 07/27/2021   Lab Results  Component Value Date   IRON 61 07/27/2021   TIBC 280 07/27/2021   FERRITIN 24 07/27/2021   Attestation Statements:    Reviewed by clinician on day of visit: allergies, medications, problem list, medical history, surgical history, family history, social history, and previous encounter notes.   Trude Mcburney, am acting as transcriptionist for Seymour Bars, DO  I have reviewed the above documentation for accuracy and completeness, and I agree with the above. Glennis Brink, DO

## 2021-08-29 ENCOUNTER — Encounter (INDEPENDENT_AMBULATORY_CARE_PROVIDER_SITE_OTHER): Payer: Self-pay | Admitting: Family Medicine

## 2021-08-29 ENCOUNTER — Ambulatory Visit (INDEPENDENT_AMBULATORY_CARE_PROVIDER_SITE_OTHER): Payer: Managed Care, Other (non HMO) | Admitting: Family Medicine

## 2021-08-29 VITALS — BP 123/71 | HR 76 | Temp 98.0°F | Ht 67.0 in | Wt 284.0 lb

## 2021-08-29 DIAGNOSIS — Z6841 Body Mass Index (BMI) 40.0 and over, adult: Secondary | ICD-10-CM

## 2021-08-29 DIAGNOSIS — Z9989 Dependence on other enabling machines and devices: Secondary | ICD-10-CM

## 2021-08-29 DIAGNOSIS — E559 Vitamin D deficiency, unspecified: Secondary | ICD-10-CM

## 2021-08-29 DIAGNOSIS — E669 Obesity, unspecified: Secondary | ICD-10-CM

## 2021-08-29 DIAGNOSIS — E7849 Other hyperlipidemia: Secondary | ICD-10-CM | POA: Diagnosis not present

## 2021-08-29 DIAGNOSIS — G4733 Obstructive sleep apnea (adult) (pediatric): Secondary | ICD-10-CM

## 2021-08-30 DIAGNOSIS — E7849 Other hyperlipidemia: Secondary | ICD-10-CM | POA: Insufficient documentation

## 2021-09-02 NOTE — Progress Notes (Unsigned)
Chief Complaint:   OBESITY Janet [name] is here to discuss her progress with her obesity treatment plan along with follow-up of her obesity related diagnoses. [name] is on the Category 3 Plan and states she is following her eating plan approximately 55% of the time. Charmon states she is walking 3,000-4,000 steps daily 4 times per week.  Today's visit was #: 2 Starting weight: 284 lbs Starting date: 08/15/2021 Today's weight: 284 lbs Today's date: 08/29/2021 Total lbs lost to date: 0 Total lbs lost since last in-office visit: 0  Interim History: Doing well with meal plan.  Feels adequately full with meals.  Denies hunger or cravings.  Had a big meal last night and sweet tea.  Continues to consume potatoes, rice, sweets.  Getting <5,000 steps per day.  Poor support at home.  Drinks regular Gatorade.  Subjective:   1. Other hyperlipidemia Discussed labs with patient today. LDL 122, down from 156.  Not on any medications.  2. OSA on CPAP Using CPAP and aiming for 7-8 hours of sleep at night.   3. Vitamin D deficiency Last Vitamin D level low at 22. Not consistently taking prescription Vitamin D every week.    Assessment/Plan:   1. Other hyperlipidemia Continue a heart health diet and recheck labs in 6 months.   2. OSA on CPAP Continue CPAP, look for OSA improvements with weight loss.   3. Vitamin D deficiency Take prescription Vitamin D 50,000 IU every week.   4. Obesity, current BMI 44.5 Handouts given:  Eating out guide, Smart Fruit.  Consider restarting Wegovy when available.   Jashawna is currently in the action stage of change. As such, her goal is to continue with weight loss efforts. She has agreed to the Category 3 Plan and keeping a food journal and adhering to recommended goals of 1600 calories and 90-100 protein daily.   Exercise goals:  >6,000 steps daily.  Behavioral modification strategies: increasing lean protein intake, decreasing liquid calories, decreasing  eating out, no skipping meals, keeping healthy foods in the home, better snacking choices, dealing with family or coworker sabotage, and decreasing junk food.  Kischa has agreed to follow-up with our clinic in 3 weeks. She was informed of the importance of frequent follow-up visits to maximize her success with intensive lifestyle modifications for her multiple health conditions.   Objective:   Blood pressure 123/71, pulse 76, temperature 98 F (36.7 C), height 5\' 7"  (1.702 m), weight 284 lb (128.8 kg), SpO2 98 %. Body mass index is 44.48 kg/m.  General: Cooperative, alert, well developed, in no acute distress. HEENT: Conjunctivae and lids unremarkable. Cardiovascular: Regular rhythm.  Lungs: Normal work of breathing. Neurologic: No focal deficits.   Lab Results  Component Value Date   CREATININE 0.91 07/27/2021   BUN 10 07/27/2021   NA 138 07/27/2021   K 4.0 07/27/2021   CL 102 07/27/2021   CO2 22 07/27/2021   Lab Results  Component Value Date   ALT 10 02/28/2021   AST 12 02/28/2021   ALKPHOS 112 02/28/2021   BILITOT 0.4 02/28/2021   Lab Results  Component Value Date   HGBA1C 5.5 08/15/2021   HGBA1C 5.1 02/28/2021   HGBA1C 5.4 11/22/2019   Lab Results  Component Value Date   INSULIN 13.8 08/15/2021   Lab Results  Component Value Date   TSH 0.616 07/27/2021   Lab Results  Component Value Date   CHOL 184 08/15/2021   HDL 36 (L) 08/15/2021   LDLCALC  122 (H) 08/15/2021   TRIG 147 08/15/2021   CHOLHDL 6.6 (H) 02/28/2021   Lab Results  Component Value Date   VD25OH 22.0 (L) 07/27/2021   VD25OH 22.1 (L) 02/28/2021   Lab Results  Component Value Date   WBC 10.5 07/27/2021   HGB 13.2 07/27/2021   HCT 39.1 07/27/2021   MCV 90 07/27/2021   PLT 323 07/27/2021   Lab Results  Component Value Date   IRON 61 07/27/2021   TIBC 280 07/27/2021   FERRITIN 24 07/27/2021    Attestation Statements:   Reviewed by clinician on day of visit: allergies, medications,  problem list, medical history, surgical history, family history, social history, and previous encounter notes.  I, Malcolm Metro, am acting as Energy manager for Seymour Bars, DO.  I have reviewed the above documentation for accuracy and completeness, and I agree with the above. Glennis Brink, DO

## 2021-09-14 ENCOUNTER — Other Ambulatory Visit: Payer: Self-pay | Admitting: Nurse Practitioner

## 2021-09-14 DIAGNOSIS — M7989 Other specified soft tissue disorders: Secondary | ICD-10-CM

## 2021-09-17 ENCOUNTER — Other Ambulatory Visit: Payer: Self-pay | Admitting: Nurse Practitioner

## 2021-09-17 DIAGNOSIS — M7989 Other specified soft tissue disorders: Secondary | ICD-10-CM

## 2021-09-18 ENCOUNTER — Other Ambulatory Visit: Payer: Self-pay | Admitting: Family Medicine

## 2021-09-18 DIAGNOSIS — M7989 Other specified soft tissue disorders: Secondary | ICD-10-CM

## 2021-09-19 NOTE — Telephone Encounter (Signed)
Wanted to make sure before I refilled it was ok.  Patient last seen 04/30/21, medication is on her list but shows last prescribed from our office in 2021.

## 2021-10-10 ENCOUNTER — Ambulatory Visit (INDEPENDENT_AMBULATORY_CARE_PROVIDER_SITE_OTHER): Payer: Managed Care, Other (non HMO) | Admitting: Family Medicine

## 2021-10-10 ENCOUNTER — Encounter (INDEPENDENT_AMBULATORY_CARE_PROVIDER_SITE_OTHER): Payer: Self-pay | Admitting: Family Medicine

## 2021-10-10 VITALS — BP 126/84 | HR 88 | Temp 97.8°F | Ht 67.0 in | Wt 277.0 lb

## 2021-10-10 DIAGNOSIS — G4733 Obstructive sleep apnea (adult) (pediatric): Secondary | ICD-10-CM | POA: Diagnosis not present

## 2021-10-10 DIAGNOSIS — E669 Obesity, unspecified: Secondary | ICD-10-CM

## 2021-10-10 DIAGNOSIS — Z6841 Body Mass Index (BMI) 40.0 and over, adult: Secondary | ICD-10-CM

## 2021-10-10 DIAGNOSIS — E559 Vitamin D deficiency, unspecified: Secondary | ICD-10-CM

## 2021-10-10 MED ORDER — VITAMIN D (ERGOCALCIFEROL) 1.25 MG (50000 UNIT) PO CAPS: 50000.0000 [IU] | ORAL_CAPSULE | ORAL | 0 refills | Status: DC

## 2021-10-18 NOTE — Progress Notes (Signed)
Chief Complaint:   OBESITY Janet [name] is here to discuss her progress with her obesity treatment plan along with follow-up of her obesity related diagnoses. Janet [name] is on the Category 3 Plan and keeping a food journal and adhering to recommended goals of 1600 calories and 90-100-85 protein and states she is following her eating plan approximately 85% of the time. Janet [name] states she is walking 30 minutes 5 times per week.  Today's visit was #: 2 Starting weight: 284 lbs Starting date: 08/15/2021 Today's weight: 277 lbs Today's date: 10/10/2021 Total lbs lost to date: 7 lbs Total lbs lost since last in-office visit: 7 lbs  Interim History: Janet [name] of food choices and portion sizes.  Trying to eat on a schedule.  Some menstrual food cravings.  Cut back on night snacking.  Feeling less swollen.  She has increased walking time. Fruits and gummies for lunch.   Subjective:   1. Vitamin D deficiency She is on prescription Vitamin D 50,000 IU weekly.  Last Vitamin D level 22.  2. OSA on CPAP Using CPAP every night and aims for 8 hours of sleep at night.   Assessment/Plan:   1. Vitamin D deficiency Refill - Vitamin D, Ergocalciferol, (DRISDOL) 1.25 MG (50000 UNIT) CAPS capsule; Take 1 capsule (50,000 Units total) by mouth every 7 (seven) days.  Dispense: 12 capsule; Refill: 0  2. OSA on CPAP Continue nightly CPAP and weight loss plan.   3. Obesity,current BMI 43.5 Reviewed Bioim[employer] results, lost 3 lbs of body water, 6 lbs of body fat and maintained muscle mass.   Janet [name] is currently in the action stage of change. As such, her goal is to continue with weight loss efforts. She has agreed to keeping a food journal and adhering to recommended goals of 1600 calories and 90-100 protein.   Exercise goals:  continue walking 30 minutes 5 times per week.   Behavioral modification strategies: increasing water intake, decreasing eating out, no skipping meals, meal planning and cooking strategies,  better snacking choices, and decreasing junk food.  Janet [name] has agreed to follow-up with our clinic in 3 weeks. She was informed of the importance of frequent follow-up visits to maximize her success with intensive lifestyle modifications for her multiple health conditions.   Objective:   Blood pressure 126/84, pulse 88, temperature 97.8 F (36.6 C), height 5\' 7"  (1.702 m), weight 277 lb (125.6 kg), SpO2 98 %. Body mass index is 43.38 kg/m.  General: Cooperative, alert, well developed, in no acute distress. HEENT: Conjunctivae and lids unremarkable. Cardiovascular: Regular rhythm.  Lungs: Normal work of breathing. Neurologic: No focal deficits.   Lab Results  Component Value Date   CREATININE 0.91 07/27/2021   BUN 10 07/27/2021   NA 138 07/27/2021   K 4.0 07/27/2021   CL 102 07/27/2021   CO2 22 07/27/2021   Lab Results  Component Value Date   ALT 10 02/28/2021   AST 12 02/28/2021   ALKPHOS 112 02/28/2021   BILITOT 0.4 02/28/2021   Lab Results  Component Value Date   HGBA1C 5.5 08/15/2021   HGBA1C 5.1 02/28/2021   HGBA1C 5.4 11/22/2019   Lab Results  Component Value Date   INSULIN 13.8 08/15/2021   Lab Results  Component Value Date   TSH 0.616 07/27/2021   Lab Results  Component Value Date   CHOL 184 08/15/2021   HDL 36 (L) 08/15/2021   LDLCALC 122 (H) 08/15/2021   TRIG 147 08/15/2021   CHOLHDL 6.6 (H) 02/28/2021  Lab Results  Component Value Date   VD25OH 22.0 (L) 07/27/2021   VD25OH 22.1 (L) 02/28/2021   Lab Results  Component Value Date   WBC 10.5 07/27/2021   HGB 13.2 07/27/2021   HCT 39.1 07/27/2021   MCV 90 07/27/2021   PLT 323 07/27/2021   Lab Results  Component Value Date   IRON 61 07/27/2021   TIBC 280 07/27/2021   FERRITIN 24 07/27/2021    Attestation Statements:   Reviewed by clinician on day of visit: allergies, medications, problem list, medical history, surgical history, family history, social history, and previous encounter  notes.  Time spent on visit including pre-visit chart review and post-visit care and charting was 30 minutes.   I, Davy Pique, am acting as Location manager for Loyal Gambler, DO.  I have reviewed the above documentation for accuracy and completeness, and I agree with the above. Dell Ponto, DO

## 2021-10-29 ENCOUNTER — Other Ambulatory Visit: Payer: Managed Care, Other (non HMO)

## 2021-10-30 LAB — TSH: TSH: 2.73 u[IU]/mL (ref 0.450–4.500)

## 2021-10-30 LAB — T4, FREE: Free T4: 1.48 ng/dL (ref 0.82–1.77)

## 2021-11-06 NOTE — Patient Instructions (Signed)

## 2021-11-07 ENCOUNTER — Encounter: Payer: Self-pay | Admitting: Nurse Practitioner

## 2021-11-07 ENCOUNTER — Ambulatory Visit (INDEPENDENT_AMBULATORY_CARE_PROVIDER_SITE_OTHER): Payer: Managed Care, Other (non HMO) | Admitting: Nurse Practitioner

## 2021-11-07 VITALS — BP 138/80 | HR 80 | Ht 67.0 in | Wt 281.8 lb

## 2021-11-07 DIAGNOSIS — E063 Autoimmune thyroiditis: Secondary | ICD-10-CM

## 2021-11-07 DIAGNOSIS — E038 Other specified hypothyroidism: Secondary | ICD-10-CM

## 2021-11-07 MED ORDER — LEVOTHYROXINE SODIUM 100 MCG PO TABS: 100.0000 ug | ORAL_TABLET | Freq: Every day | ORAL | 3 refills | Status: DC

## 2021-11-07 MED ORDER — LEVOTHYROXINE SODIUM 112 MCG PO TABS: 112.0000 ug | ORAL_TABLET | Freq: Every day | ORAL | 3 refills | Status: DC

## 2021-11-07 NOTE — Progress Notes (Signed)
Endocrinology Follow Up Note                                         11/07/2021, 2:14 PM  Subjective:   Subjective    Janet [name] is a 26 y.o.-year-old female patient being seen in follow up after being seen in consultation for hypothyroidism referred by [name], DO.   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   Hypothyroidism    Kidney problem    Sleep apnea    Swallowing difficulty    Thyroid disease    Hashimotos    Vitamin D deficiency     Past Surgical History:  Procedure Laterality Date   BIOPSY  05/09/2020   Procedure: BIOPSY;  Surgeon: Harvel Quale, MD;  Location: [facility];  Service: Gastroenterology;;   CHOLECYSTECTOMY N/A 05/15/2020   Procedure: LAPAROSCOPIC CHOLECYSTECTOMY;  Surgeon: Virl Cagey, MD;  Location: [facility];  Service: General;  Laterality: N/A;   ESOPHAGOGASTRODUODENOSCOPY (EGD) WITH PROPOFOL N/A 05/09/2020   Procedure: ESOPHAGOGASTRODUODENOSCOPY (EGD) WITH PROPOFOL;  Surgeon: Harvel Quale, MD;  Location: [facility];  Service: Gastroenterology;  Laterality: N/A;  AM   THYROIDECTOMY N/A 07/01/2017   Procedure: TOTAL THYROIDECTOMY;  Surgeon: [name], MD;  Location: [facility];  Service: General;  Laterality: N/A;   TONSILLECTOMY AND ADENOIDECTOMY Bilateral 01/2011   TOTAL THYROIDECTOMY     Dr. [name] 07-01-17    Social History   Socioeconomic History   Marital status: Single    Spouse name: Not on file   Number of children: Not on file   Years of education: Not on file   Highest education level: Not on file  Occupational History   Occupation: Scientist, water quality    Comment: Customer service    Occupation: Patient Registration  Tobacco Use   Smoking status: Some Days    Packs/day: 0.50    Years: 2.00    Total pack years: 1.00    Types: Cigarettes   Smokeless tobacco: Never  Vaping Use   Vaping Use: Never used  Substance and Sexual Activity   Alcohol use: Not Currently    Comment: rare   Drug use: No   Sexual activity: Not Currently    Birth control/protection: None, Pill  Other Topics Concern   Not on file  Social History Narrative   Not on file   Social Determinants of Health   Financial Resource Strain: Medium Risk (04/07/2019)   Overall Financial Resource Strain (CARDIA)    Difficulty of Paying Living Expenses: Somewhat hard  Food Insecurity: No Food Insecurity (04/07/2019)   Hunger Vital Sign    Worried About Running  Out of Food in the Last Year: Never true    Ran Out of Food in the Last Year: Never true  Transportation Needs: No Transportation Needs (04/07/2019)   PRAPARE - Hydrologist (Medical): No    Lack of Transportation (Non-Medical): No  Physical Activity: Insufficiently Active (04/07/2019)   Exercise Vital Sign    Days of Exercise per Week: 4 days    Minutes of Exercise per Session: 30 min  Stress: Stress Concern Present (04/07/2019)   [place]    Feeling of Stress : Rather much  Social Connections: Moderately Isolated (04/07/2019)   Social Connection and Isolation Panel [NHANES]    Frequency of Communication with Friends and Family: More than three times a week    Frequency of Social Gatherings with Friends and Family: Once a week    Attends Religious Services: 1 to 4 times per year    Active Member of [employer] or Organizations: No    Attends Music therapist: Never    Marital Status: Never married    Family History  Problem Relation Age of Onset   Hyperlipidemia Mother    Hypertension Mother    Diabetes Mother    Other Mother         gastropresis   Anxiety disorder Mother    Sleep apnea Mother    Other Father        syncope-has a pacemaker   COPD Father    Heart disease Father    Heart attack Maternal Grandmother    Heart Problems Maternal Grandfather    Heart attack Maternal Grandfather    Cancer Paternal Grandmother    Cancer Paternal Grandfather     Outpatient Encounter Medications as of 11/07/2021  Medication Sig   escitalopram (LEXAPRO) 20 MG tablet Take 1 tablet (20 mg total) by mouth at bedtime.   ferrous sulfate 324 (65 Fe) MG TBEC Take 1 tablet (324 mg total) by mouth daily. (Patient taking differently: Take 324 mg by mouth at bedtime.)   furosemide (LASIX) 20 MG tablet ALTERNATE 1 OR 2 TABLETS EVERY OTHER DAY AS DIRECTED   ibuprofen (ADVIL) 200 MG tablet Take 400-600 mg by mouth every 6 (six) hours as needed for headache.   norethindrone-ethinyl estradiol-FE (LOESTRIN FE) 1-20 MG-MCG tablet Take 1 tablet by mouth daily.   omeprazole (PRILOSEC) 20 MG capsule Take 1 capsule (20 mg total) by mouth daily.   ondansetron (ZOFRAN) 4 MG tablet Take 1 tablet (4 mg total) by mouth every 8 (eight) hours as needed for nausea or vomiting.   SF 5000 PLUS 1.1 % CREA dental cream Take by mouth every morning.   Vitamin D, Ergocalciferol, (DRISDOL) 1.25 MG (50000 UNIT) CAPS capsule Take 1 capsule (50,000 Units total) by mouth every 7 (seven) days.   [DISCONTINUED] levothyroxine (SYNTHROID) 100 MCG tablet Take 1 tablet (100 mcg total) by mouth daily before breakfast.   levothyroxine (SYNTHROID) 100 MCG tablet Take 1 tablet (100 mcg total) by mouth daily before breakfast.   levothyroxine (SYNTHROID) 112 MCG tablet Take 1 tablet (112 mcg total) by mouth daily.   metoCLOPramide (REGLAN) 5 MG tablet Take 1 tablet (5 mg total) by mouth 4 (four) times daily -  before meals and at bedtime.   [DISCONTINUED] levothyroxine (SYNTHROID) 112 MCG tablet Take 1 tablet (112 mcg total) by mouth daily.   [DISCONTINUED] Levothyroxine Sodium 150  MCG CAPS Take by mouth. (Patient not taking: Reported on  11/07/2021)   No facility-administered encounter medications on file as of 11/07/2021.    ALLERGIES: No Known Allergies VACCINATION STATUS: Immunization History  Administered Date(s) Administered   DTaP 08/14/1994, 10/09/1994, 12/11/1994, 09/23/1995, 08/08/1999   HIB (PRP-OMP) 08/14/1994, 10/09/1994, 12/11/1994, 09/23/1995   HPV Quadrivalent 10/02/2005, 12/02/2005, 07/31/2009   Hepatitis B 1994[birth year]04[birth year], 08/14/1994, 12/11/1994   IPV 08/14/1994, 10/09/1994, 12/11/1994, 08/08/1999   Influenza,inj,Quad PF,6+ Mos 10/26/2012, 01/21/2014, 11/25/2014, 10/16/2016, 10/03/2017, 08/31/2018, 11/22/2019, 11/14/2020   Influenza-Unspecified 08/31/2018   MMR 06/17/1995, 08/08/1999   Moderna Sars-Covid-2 Vaccination 05/24/2019, 06/21/2019, 01/25/2020   Td 07/31/2009, 11/22/2019   Tdap 07/31/2009, 01/22/2016   Varicella 09/23/1995     HPI   Janet [name]  is a patient with the above medical history. she was diagnosed with Hashimoto's thyroiditis complicated by large goiter at approximate age of 22 years, which required total thyroidectomy and subsequent initiation of thyroid hormone replacement. she was given various doses of thyroid hormone over the years, currently on Levothyroxine 212 micrograms. she reports compliance to this medication:  Taking it daily on empty stomach  with water, separated by >30 minutes before breakfast and other medications, and by at least 4 hours from calcium, iron, PPIs, multivitamins .  I reviewed patient's thyroid tests:  Lab Results  Component Value Date   TSH 2.730 10/29/2021   TSH 0.616 07/27/2021   TSH 6.440 (H) 04/16/2021   TSH 60.500 (H) 02/28/2021   TSH 301.000 (H) 12/20/2019   TSH 209.200 (H) 10/03/2017   TSH 9.04 (H) 04/16/2017   TSH 13.34 (H) 12/25/2016   TSH 10.57 (H) 10/16/2016   TSH 12.48 (H) 02/26/2016   FREET4 1.48 10/29/2021   FREET4 1.26 07/27/2021   FREET4 1.04 04/16/2021   FREET4 0.80  (L) 02/28/2021   FREET4 0.56 (L) 04/16/2017   FREET4 0.64 12/25/2016   FREET4 0.71 10/16/2016   FREET4 0.83 02/26/2016   FREET4 0.48 (L) 11/02/2015   FREET4 0.88 08/25/2015    Pt denies feeling nodules in neck, hoarseness, dysphagia/odynophagia, SOB with lying down.  she denies family history of thyroid disorders.  No family history of thyroid cancer.  No history of radiation therapy to head or neck.  No recent use of iodine supplements.  Denies use of Biotin containing supplements.  I reviewed her chart and she also has a history of anemia, CKD, GERD, OSA.   ROS:  Constitutional: + minimally fluctuating weight, + fatigue-improving, no subjective hyperthermia, no subjective hypothermia Eyes: no blurry vision, no xerophthalmia ENT: no sore throat, no nodules palpated in throat, no dysphagia/odynophagia, no hoarseness Cardiovascular: no chest pain, no SOB, no palpitations, intermittent generalized swelling Respiratory: no cough, no SOB Gastrointestinal: no nausea/vomiting/diarrhea Musculoskeletal: no muscle/joint aches Skin: no rashes Neurological: no tremors, no numbness, no tingling, no dizziness Psychiatric: no depression, no anxiety   Objective:   Objective     BP 138/80 (BP Location: Left Arm, Patient Position: Sitting, Cuff Size: Large)   Pulse 80   Ht _0  (1.702 m)   Wt 281 lb 12.8 oz (127.8 kg)   BMI 44.14 kg/m  Wt Readings from Last 3 Encounters:  11/07/21 281 lb 12.8 oz (127.8 kg)  10/10/21 277 lb (125.6 kg)  08/29/21 284 lb (128.8 kg)    BP Readings from Last 3 Encounters:  11/07/21 138/80  10/10/21 126/84  08/29/21 123/71      Physical Exam- Limited  Constitutional:  Body mass index is 44.14 kg/m. , not in acute distress, normal state of mind Eyes:  EOMI, no  exophthalmos Neck: Supple Thyroid: No gross goiter- surgical scar from previous total thyroidectomy Cardiovascular: RRR, no murmurs, rubs, or gallops, generalized edema Respiratory:  Adequate breathing efforts, no crackles, rales, rhonchi, or wheezing Musculoskeletal: no gross deformities, strength intact in all four extremities, no gross restriction of joint movements Skin:  no rashes, no hyperemia Neurological: no tremor with outstretched hands   CMP ( most recent) CMP     Component Value Date/Time   NA 138 07/27/2021 1402   K 4.0 07/27/2021 1402   CL 102 07/27/2021 1402   CO2 22 07/27/2021 1402   GLUCOSE 86 07/27/2021 1402   GLUCOSE 107 (H) 05/08/2020 0912   BUN 10 07/27/2021 1402   CREATININE 0.91 07/27/2021 1402   CALCIUM 8.8 07/27/2021 1402   PROT 7.0 02/28/2021 1457   ALBUMIN 4.0 07/27/2021 1402   AST 12 02/28/2021 1457   ALT 10 02/28/2021 1457   ALKPHOS 112 02/28/2021 1457   BILITOT 0.4 02/28/2021 1457   GFRNONAA >60 05/08/2020 0912   GFRAA 85 12/20/2019 1154     Diabetic Labs (most recent): Lab Results  Component Value Date   HGBA1C 5.5 08/15/2021   HGBA1C 5.1 02/28/2021   HGBA1C 5.4 11/22/2019     Lipid Panel ( most recent) Lipid Panel     Component Value Date/Time   CHOL 184 08/15/2021 1108   TRIG 147 08/15/2021 1108   HDL 36 (L) 08/15/2021 1108   CHOLHDL 6.6 (H) 02/28/2021 1457   LDLCALC 122 (H) 08/15/2021 1108   LABVLDL 26 08/15/2021 1108       Lab Results  Component Value Date   TSH 2.730 10/29/2021   TSH 0.616 07/27/2021   TSH 6.440 (H) 04/16/2021   TSH 60.500 (H) 02/28/2021   TSH 301.000 (H) 12/20/2019   TSH 209.200 (H) 10/03/2017   TSH 9.04 (H) 04/16/2017   TSH 13.34 (H) 12/25/2016   TSH 10.57 (H) 10/16/2016   TSH 12.48 (H) 02/26/2016   FREET4 1.48 10/29/2021   FREET4 1.26 07/27/2021   FREET4 1.04 04/16/2021   FREET4 0.80 (L) 02/28/2021   FREET4 0.56 (L) 04/16/2017   FREET4 0.64 12/25/2016   FREET4 0.71 10/16/2016   FREET4 0.83 02/26/2016   FREET4 0.48 (L) 11/02/2015   FREET4 0.88 08/25/2015      Latest Reference Range & Units 12/20/19 11:54 02/28/21 14:57 04/16/21 14:28 07/27/21 14:02 10/29/21 16:08   TSH 0.450 - 4.500 uIU/mL 301.000 (H) 60.500 (H) 6.440 (H) 0.616 2.730  T4,Free(Direct) 0.82 - 1.77 ng/dL  0.80 (L) 1.04 1.26 1.48  Thyroxine (T4) 4.5 - 12.0 ug/dL 0.4 (LL)      Free Thyroxine Index 1.2 - 4.9  0.1 (L)      T3 Uptake Ratio 24 - 39 % 15 (L)      (LL): Data is critically low (H): Data is abnormally high (L): Data is abnormally low Assessment & Plan:   ASSESSMENT / PLAN:  1. Hypothyroidism-S/P total thyroidectomy for goiter r/t Hashimoto's  Her most recent thyroid labs are consistent with appropriate hormone replacement.  She is advised to continue her Levothyroxine 212 mcg (1 of 100 mcg and 1 of 112 mcg tabs) po daily before breakfast.  - We discussed about correct intake of levothyroxine, at fasting, with water, separated by at least 30 minutes from breakfast, and separated by more than 4 hours from calcium, iron, multivitamins, acid reflux medications (PPIs). -Patient is made aware of the fact that thyroid hormone replacement is needed for life, dose to be adjusted by  periodic monitoring of thyroid function tests.  -Her thyroid ultrasound shows no residual thyroid tissue after total thyroidectomy.  No need for further imaging at this time.     I spent 20 minutes in the care of the patient today including review of labs from Thyroid Function, CMP, and other relevant labs ; imaging/biopsy records (current and previous including abstractions from other facilities); face-to-face time discussing  her lab results and symptoms, medications doses, her options of short and long term treatment based on the latest standards of care / guidelines;   and documenting the encounter.  Janet [name]  participated in the discussions, expressed understanding, and voiced agreement with the above plans.  All questions were answered to her satisfaction. she is encouraged to contact clinic should she have any questions or concerns prior to her return visit.   FOLLOW UP PLAN:  Return in about 4  months (around 03/08/2022) for Thyroid follow up, Previsit labs.  Rayetta Pigg, [facility] [address] [place], [place] [address] Phone: [phone] Fax: [phone]  11/07/2021, 2:14 PM

## 2021-11-12 ENCOUNTER — Encounter (INDEPENDENT_AMBULATORY_CARE_PROVIDER_SITE_OTHER): Payer: Self-pay | Admitting: Family Medicine

## 2021-11-12 ENCOUNTER — Ambulatory Visit (INDEPENDENT_AMBULATORY_CARE_PROVIDER_SITE_OTHER): Payer: Managed Care, Other (non HMO) | Admitting: Family Medicine

## 2021-11-12 VITALS — BP 133/85 | HR 89 | Temp 97.9°F | Ht 67.0 in | Wt 278.0 lb

## 2021-11-12 DIAGNOSIS — G4733 Obstructive sleep apnea (adult) (pediatric): Secondary | ICD-10-CM | POA: Diagnosis not present

## 2021-11-12 DIAGNOSIS — E669 Obesity, unspecified: Secondary | ICD-10-CM

## 2021-11-12 DIAGNOSIS — E559 Vitamin D deficiency, unspecified: Secondary | ICD-10-CM

## 2021-11-12 DIAGNOSIS — E038 Other specified hypothyroidism: Secondary | ICD-10-CM

## 2021-11-12 DIAGNOSIS — N182 Chronic kidney disease, stage 2 (mild): Secondary | ICD-10-CM | POA: Diagnosis not present

## 2021-11-12 DIAGNOSIS — Z6841 Body Mass Index (BMI) 40.0 and over, adult: Secondary | ICD-10-CM

## 2021-11-12 MED ORDER — VITAMIN D (ERGOCALCIFEROL) 1.25 MG (50000 UNIT) PO CAPS: 50000.0000 [IU] | ORAL_CAPSULE | ORAL | 0 refills | Status: DC

## 2021-11-17 LAB — VITAMIN D, 25-HYDROXY, TOTAL: Vitamin D, 25-Hydroxy, Serum: 35 ng/mL

## 2021-11-22 NOTE — Progress Notes (Signed)
Chief Complaint:   OBESITY [name] is here to discuss her progress with her obesity treatment plan along with follow-up of her obesity related diagnoses. Janet [name] is on keeping a food journal and adhering to recommended goals of 1600 calories and 90-100 protein and states she is following her eating plan approximately walking% of the time. Janet [name] states she is 30-60 minutes 5 times per week.  Today's visit was #: 3 Starting weight: 284 lbs Starting date: 08/15/2021 Today's weight: 278 lbs Today's date: 11/12/2021 Total lbs lost to date: 6 lbs Total lbs lost since last in-office visit: + 1 lb  Interim History: She is doing well on Category 3 meal plan, but has been adding pork bacon in th morning.  Brings lunch to work, apples, greek yogurt.  Eats diner with her mom.  Denies night snacks or sweets.  Gets in more water and walking.    Subjective:   1. Other specified hypothyroidism 10/29/2021, TSH 2.730, she is taking Synthroid daily as directed.    2. Vitamin D deficiency She is currently taking prescription vitamin D 50,000 IU each week. She denies nausea, vomiting or muscle weakness. Energy level is fair.   3. Stage 2 chronic kidney disease She is on furosemide.  She ha increased to 40 mg daily with increased edema in ankles.  She has been urinating less often.   4. OSA on CPAP Wearing a CPAP at night, getting >4 hours at night with CPAP.   Assessment/Plan:   1. Other specified hypothyroidism Continue current dose of Synthroid.   2. Vitamin D deficiency Check labs today.   Refill - Vitamin D, Ergocalciferol, (DRISDOL) 1.25 MG (50000 UNIT) CAPS capsule; Take 1 capsule (50,000 Units total) by mouth every 7 (seven) days.  Dispense: 12 capsule; Refill: 0  - Vitamin D, 25-Hydroxy, Total  3. Stage 2 chronic kidney disease Stop high sodium foods.  Recommend that she contact Dr Wolfgang Phoenix, her nephrologist.   4. OSA on CPAP Continue CPAP nightly and aim for 7-8 hours of sleep.    5. Obesity, current BMI 43.6 1) Reviewed Bio[employer] gained 2 lbs of fluid.  Recommend that she contact her nephologist about fluid retention on furosemide.   2) Stop eating pork bacon.   Janet [name] is currently in the action stage of change. As such, her goal is to continue with weight loss efforts. She has agreed to the Category 3 Plan + 90-100 protein daily.   Exercise goals:  bands given, track steps.   Behavioral modification strategies: increasing vegetables, increasing water intake, decreasing liquid calories, increasing high fiber foods, meal planning and cooking strategies, keeping healthy foods in the home, and better snacking choices.  Janet [name] has agreed to follow-up with our clinic in 3 weeks. She was informed of the importance of frequent follow-up visits to maximize her success with intensive lifestyle modifications for her multiple health conditions.   Objective:   Blood pressure 133/85, pulse 89, temperature 97.9 F (36.6 C), height 5\' 7"  (1.702 m), weight 278 lb (126.1 kg), SpO2 99 %. Body mass index is 43.54 kg/m.  General: Cooperative, alert, well developed, in no acute distress. HEENT: Conjunctivae and lids unremarkable. Cardiovascular: Regular rhythm.  Lungs: Normal work of breathing. Neurologic: No focal deficits.   Lab Results  Component Value Date   CREATININE 0.91 07/27/2021   BUN 10 07/27/2021   NA 138 07/27/2021   K 4.0 07/27/2021   CL 102 07/27/2021   CO2 22 07/27/2021   Lab Results  Component Value Date   ALT 10 02/28/2021   AST 12 02/28/2021   ALKPHOS 112 02/28/2021   BILITOT 0.4 02/28/2021   Lab Results  Component Value Date   HGBA1C 5.5 08/15/2021   HGBA1C 5.1 02/28/2021   HGBA1C 5.4 11/22/2019   Lab Results  Component Value Date   INSULIN 13.8 08/15/2021   Lab Results  Component Value Date   TSH 2.730 10/29/2021   Lab Results  Component Value Date   CHOL 184 08/15/2021   HDL 36 (L) 08/15/2021   LDLCALC 122 (H) 08/15/2021    TRIG 147 08/15/2021   CHOLHDL 6.6 (H) 02/28/2021   Lab Results  Component Value Date   VD25OH 22.0 (L) 07/27/2021   VD25OH 22.1 (L) 02/28/2021   Lab Results  Component Value Date   WBC 10.5 07/27/2021   HGB 13.2 07/27/2021   HCT 39.1 07/27/2021   MCV 90 07/27/2021   PLT 323 07/27/2021   Lab Results  Component Value Date   IRON 61 07/27/2021   TIBC 280 07/27/2021   FERRITIN 24 07/27/2021    Attestation Statements:   Reviewed by clinician on day of visit: allergies, medications, problem list, medical history, surgical history, family history, social history, and previous encounter notes.  I have personally spent 40 minutes total time today in preparation, patient care, and documentation for this visit, including the following: review of clinical lab tests; review of medical tests/procedures/services.    I, Malcolm Metro, am acting as Energy manager for Seymour Bars, DO.  I have reviewed the above documentation for accuracy and completeness, and I agree with the above. Glennis Brink, DO

## 2021-12-10 ENCOUNTER — Ambulatory Visit (INDEPENDENT_AMBULATORY_CARE_PROVIDER_SITE_OTHER): Payer: Managed Care, Other (non HMO) | Admitting: Family Medicine

## 2021-12-10 ENCOUNTER — Encounter (INDEPENDENT_AMBULATORY_CARE_PROVIDER_SITE_OTHER): Payer: Self-pay | Admitting: Family Medicine

## 2021-12-10 VITALS — BP 114/80 | HR 66 | Temp 97.9°F | Ht 67.0 in | Wt 275.0 lb

## 2021-12-10 DIAGNOSIS — Z6841 Body Mass Index (BMI) 40.0 and over, adult: Secondary | ICD-10-CM

## 2021-12-10 DIAGNOSIS — R600 Localized edema: Secondary | ICD-10-CM | POA: Insufficient documentation

## 2021-12-10 DIAGNOSIS — G4733 Obstructive sleep apnea (adult) (pediatric): Secondary | ICD-10-CM

## 2021-12-10 DIAGNOSIS — E559 Vitamin D deficiency, unspecified: Secondary | ICD-10-CM | POA: Diagnosis not present

## 2021-12-10 DIAGNOSIS — E669 Obesity, unspecified: Secondary | ICD-10-CM

## 2021-12-10 MED ORDER — VITAMIN D (ERGOCALCIFEROL) 1.25 MG (50000 UNIT) PO CAPS: 50000.0000 [IU] | ORAL_CAPSULE | ORAL | 0 refills | Status: DC

## 2021-12-19 NOTE — Progress Notes (Unsigned)
Chief Complaint:   OBESITY Janet [name] is here to discuss her progress with her obesity treatment plan along with follow-up of her obesity related diagnoses. Janet [name] is on the Category 3 Plan with 90-100 protein and states she is following her eating plan approximately 85% of the time. Janet [name] states she is walking and doing armbands 35 minutes 5-6 times per week.  Today's visit was #: 4 Starting weight: 284 LBS Starting date: 08/15/2021 Today's weight: 275 LBS Today's date: 12/10/2021 Total lbs lost to date: 9 LBS Total lbs lost since last in-office visit: 3 LBS  Interim History: She is doing well with meal plan.  She denies hunger or cravings.  She has been taking [place] yogurt for a snack.  She is drinking water.  She complains of fluid retention, she is on Lasix per nephrology.  Subjective:   1. Vitamin D deficiency Patient's last vitamin D level was 35 on 11/12/2021.  Her energy level is improving.  2. Bilateral leg edema Patient is on furosemide 20 mg, taking 2 tabs once a day per nephrology.  Explained that obesity and sedentary job are likely to contribute to bilateral leg edema.  Has room to increase water intake.  She is consuming low calorie bread, with lunch meats.    3. OSA on CPAP Compliant with CPAP nightly.   Assessment/Plan:   1. Vitamin D deficiency Refill - Vitamin D, Ergocalciferol, (DRISDOL) 1.25 MG (50000 UNIT) CAPS capsule; Take 1 capsule (50,000 Units total) by mouth every 7 (seven) days.  Dispense: 12 capsule; Refill: 0  2. Bilateral leg edema Opt for chicken breast, low sodium [place] breast.  Decrease bread intake and increase veggies.   3. OSA on CPAP Aim for 7-8 hours of sleep with CPAP. Look for improvements in sleep apnea with further weight loss.   4. Obesity,current BMI 43.1 Reduce intake of breads, processed meats and adding in more veggies, water and lemon juice.   Track daily steps.   Janet [name] is currently in the action stage of change. As such, her  goal is to continue with weight loss efforts. She has agreed to the Category 3 Plan.   Exercise goals: All adults should avoid inactivity. Some physical activity is better than none, and adults who participate in any amount of physical activity gain some health benefits.  Behavioral modification strategies: increasing lean protein intake, increasing vegetables, increasing water intake, decreasing sodium intake, decreasing eating out, keeping healthy foods in the home, and planning for success.  Janet [name] has agreed to follow-up with our clinic in 3-4 weeks. She was informed of the importance of frequent follow-up visits to maximize her success with intensive lifestyle modifications for her multiple health conditions.   Objective:   Blood pressure 114/80, pulse 66, temperature 97.9 F (36.6 C), height 5\' 7"  (1.702 m), weight 275 lb (124.7 kg), SpO2 99 %. Body mass index is 43.07 kg/m.  General: Cooperative, alert, well developed, in no acute distress. HEENT: Conjunctivae and lids unremarkable. Cardiovascular: Regular rhythm.  Lungs: Normal work of breathing. Neurologic: No focal deficits.   Lab Results  Component Value Date   CREATININE 0.91 07/27/2021   BUN 10 07/27/2021   NA 138 07/27/2021   K 4.0 07/27/2021   CL 102 07/27/2021   CO2 22 07/27/2021   Lab Results  Component Value Date   ALT 10 02/28/2021   AST 12 02/28/2021   ALKPHOS 112 02/28/2021   BILITOT 0.4 02/28/2021   Lab Results  Component Value Date  HGBA1C 5.5 08/15/2021   HGBA1C 5.1 02/28/2021   HGBA1C 5.4 11/22/2019   Lab Results  Component Value Date   INSULIN 13.8 08/15/2021   Lab Results  Component Value Date   TSH 2.730 10/29/2021   Lab Results  Component Value Date   CHOL 184 08/15/2021   HDL 36 (L) 08/15/2021   LDLCALC 122 (H) 08/15/2021   TRIG 147 08/15/2021   CHOLHDL 6.6 (H) 02/28/2021   Lab Results  Component Value Date   VD25OH 22.0 (L) 07/27/2021   VD25OH 22.1 (L) 02/28/2021   Lab  Results  Component Value Date   WBC 10.5 07/27/2021   HGB 13.2 07/27/2021   HCT 39.1 07/27/2021   MCV 90 07/27/2021   PLT 323 07/27/2021   Lab Results  Component Value Date   IRON 61 07/27/2021   TIBC 280 07/27/2021   FERRITIN 24 07/27/2021   Attestation Statements:   Reviewed by clinician on day of visit: allergies, medications, problem list, medical history, surgical history, family history, social history, and previous encounter notes.  I, Malcolm Metro, am acting as Energy manager for Seymour Bars, DO.

## 2022-01-17 ENCOUNTER — Encounter (INDEPENDENT_AMBULATORY_CARE_PROVIDER_SITE_OTHER): Payer: Self-pay | Admitting: Family Medicine

## 2022-01-17 ENCOUNTER — Ambulatory Visit (INDEPENDENT_AMBULATORY_CARE_PROVIDER_SITE_OTHER): Payer: Managed Care, Other (non HMO) | Admitting: Family Medicine

## 2022-01-17 VITALS — BP 141/84 | HR 76 | Temp 98.1°F | Ht 67.0 in | Wt 273.0 lb

## 2022-01-17 DIAGNOSIS — R600 Localized edema: Secondary | ICD-10-CM | POA: Insufficient documentation

## 2022-01-17 DIAGNOSIS — E559 Vitamin D deficiency, unspecified: Secondary | ICD-10-CM | POA: Diagnosis not present

## 2022-01-17 DIAGNOSIS — Z6841 Body Mass Index (BMI) 40.0 and over, adult: Secondary | ICD-10-CM

## 2022-01-17 DIAGNOSIS — R632 Polyphagia: Secondary | ICD-10-CM | POA: Diagnosis not present

## 2022-01-17 DIAGNOSIS — E669 Obesity, unspecified: Secondary | ICD-10-CM

## 2022-01-17 MED ORDER — WEGOVY 0.25 MG/0.5ML ~~LOC~~ SOAJ: 0.2500 mg | SUBCUTANEOUS | 0 refills | Status: DC

## 2022-01-23 ENCOUNTER — Other Ambulatory Visit: Payer: Self-pay | Admitting: Family Medicine

## 2022-01-23 DIAGNOSIS — M7989 Other specified soft tissue disorders: Secondary | ICD-10-CM

## 2022-02-11 ENCOUNTER — Telehealth (INDEPENDENT_AMBULATORY_CARE_PROVIDER_SITE_OTHER): Payer: Self-pay | Admitting: *Deleted

## 2022-02-11 NOTE — Progress Notes (Unsigned)
Chief Complaint:   OBESITY Janet [name] is here to discuss her progress with her obesity treatment plan along with follow-up of her obesity related diagnoses. Janet [name] is on the Category 3 Plan and states she is following her eating plan approximately 85% of the time. [name] states she is walking 20-30 minutes 5-6 times per week.  Today's visit was #: 5 Starting weight: 284 lbs Starting date: 08/15/2021 Today's weight: 273 lbs Today's date: 01/17/2022 Total lbs lost to date: 11 lbs Total lbs lost since last in-office visit: 2 lbs  Interim History: Net weight loss 11 lbs in 5 months of medically supervised weight management = 3.8% TBW loss.  Has increased water intake.  She is on birth control pills. She is motivated to improve food compliance and increase walking time.   Subjective:   1. Vitamin D deficiency She is taking prescription Vitamin D 50,000 IU weekly.  Vitamin D level 35 on 11/12/2021. Her energy level improving.   2. Lower extremity edema Patient wears compression socks some days.  Stands/sits at work.  She is taking lasix 40 mg daily. Has upcoming appointment with nephrology.   3. Polyphagia She is struggling to see further weight loss with increased hunger.    Assessment/Plan:   1. Vitamin D deficiency Refill Vitamin D 50,000 IU weekly, #5 no refills.   2. Lower extremity edema Reduce intake of high sodium foods, aim for about 90 ounces of water intake daily.    3. Polyphagia Increase protein and fiber intake with meals. Begin Wegovy 0.25 mg weekly.  Continue OCP's.   4. Obesity,current BMI 42.9 Patient denies a personal or family history of pancreatitis, medullary thyroid carcinoma or multiple endocrine neoplasia type II.  Recommend reviewing pen training video online.  Start - Semaglutide-Weight Management (WEGOVY) 0.25 MG/0.5ML SOAJ; Inject 0.25 mg into the skin once a week.  Dispense: 2 mL; Refill: 0  Elvie is currently in the action stage of change. As such,  her goal is to continue with weight loss efforts. She has agreed to the Category 3 Plan.   Exercise goals:  As is.   Behavioral modification strategies: increasing lean protein intake, increasing vegetables, increasing water intake, decreasing eating out, no skipping meals, meal planning and cooking strategies, and decreasing junk food.  [name] has agreed to follow-up with our clinic in 4 weeks. She was informed of the importance of frequent follow-up visits to maximize her success with intensive lifestyle modifications for her multiple health conditions.   Objective:   Blood pressure (!) 141/84, pulse 76, temperature 98.1 F (36.7 C), height 5\' 7"  (1.702 m), weight 273 lb (123.8 kg), SpO2 96 %. Body mass index is 42.76 kg/m.  General: Cooperative, alert, well developed, in no acute distress. HEENT: Conjunctivae and lids unremarkable. Cardiovascular: Regular rhythm.  Lungs: Normal work of breathing. Neurologic: No focal deficits.   Lab Results  Component Value Date   CREATININE 0.91 07/27/2021   BUN 10 07/27/2021   NA 138 07/27/2021   K 4.0 07/27/2021   CL 102 07/27/2021   CO2 22 07/27/2021   Lab Results  Component Value Date   ALT 10 02/28/2021   AST 12 02/28/2021   ALKPHOS 112 02/28/2021   BILITOT 0.4 02/28/2021   Lab Results  Component Value Date   HGBA1C 5.5 08/15/2021   HGBA1C 5.1 02/28/2021   HGBA1C 5.4 11/22/2019   Lab Results  Component Value Date   INSULIN 13.8 08/15/2021   Lab Results  Component Value Date  TSH 2.730 10/29/2021   Lab Results  Component Value Date   CHOL 184 08/15/2021   HDL 36 (L) 08/15/2021   LDLCALC 122 (H) 08/15/2021   TRIG 147 08/15/2021   CHOLHDL 6.6 (H) 02/28/2021   Lab Results  Component Value Date   VD25OH 22.0 (L) 07/27/2021   VD25OH 22.1 (L) 02/28/2021   Lab Results  Component Value Date   WBC 10.5 07/27/2021   HGB 13.2 07/27/2021   HCT 39.1 07/27/2021   MCV 90 07/27/2021   PLT 323 07/27/2021   Lab Results   Component Value Date   IRON 61 07/27/2021   TIBC 280 07/27/2021   FERRITIN 24 07/27/2021   Attestation Statements:   Reviewed by clinician on day of visit: allergies, medications, problem list, medical history, surgical history, family history, social history, and previous encounter notes.  I, Davy Pique, am acting as Location manager for Loyal Gambler, DO.  I have reviewed the above documentation for accuracy and completeness, and I agree with the above. Dell Ponto, DO

## 2022-02-11 NOTE — Telephone Encounter (Signed)
Prior authorization done for patients wegovy. It was denied and needs appeal. Patient notified if she wants the appeal done she will have to initiate it.

## 2022-02-13 ENCOUNTER — Other Ambulatory Visit (HOSPITAL_COMMUNITY)
Admission: RE | Admit: 2022-02-13 | Discharge: 2022-02-13 | Disposition: A | Discharge status: Home / Self Care | Payer: Managed Care, Other (non HMO) | Source: Ambulatory Visit | Attending: Nephrology | Admitting: Nephrology

## 2022-02-13 DIAGNOSIS — N182 Chronic kidney disease, stage 2 (mild): Secondary | ICD-10-CM | POA: Diagnosis present

## 2022-02-13 LAB — RENAL FUNCTION PANEL
Albumin: 3.6 g/dL (ref 3.5–5.0)
Anion gap: 9 (ref 5–15)
BUN: 12 mg/dL (ref 6–20)
CO2: 22 mmol/L (ref 22–32)
Calcium: 8.5 mg/dL — ABNORMAL LOW (ref 8.9–10.3)
Chloride: 108 mmol/L (ref 98–111)
Creatinine, Ser: 0.74 mg/dL (ref 0.44–1.00)
GFR, Estimated: >60 mL/min (ref >60)
Glucose, Bld: 97 mg/dL (ref 70–99)
Phosphorus: 3.4 mg/dL (ref 2.5–4.6)
Potassium: 3.5 mmol/L (ref 3.5–5.1)
Sodium: 139 mmol/L (ref 135–145)

## 2022-02-13 LAB — PROTEIN / CREATININE RATIO, URINE
Creatinine, Urine: 260.43 mg/dL
Protein Creatinine Ratio: 0.07 mg/mg{Cre} (ref 0.00–0.15)
Total Protein, Urine: 19 mg/dL

## 2022-02-13 LAB — CBC WITH DIFFERENTIAL/PLATELET
Abs Immature Granulocytes: 0.03 10*3/uL (ref 0.00–0.07)
Basophils Absolute: 0 10*3/uL (ref 0.0–0.1)
Basophils Relative: 0 %
Eosinophils Absolute: 0.1 10*3/uL (ref 0.0–0.5)
Eosinophils Relative: 1 %
HCT: 37.3 % (ref 36.0–46.0)
Hemoglobin: 12.7 g/dL (ref 12.0–15.0)
Immature Granulocytes: 0 %
Lymphocytes Relative: 31 %
Lymphs Abs: 2.9 10*3/uL (ref 0.7–4.0)
MCH: 29.8 pg (ref 26.0–34.0)
MCHC: 34 g/dL (ref 30.0–36.0)
MCV: 87.6 fL (ref 80.0–100.0)
Monocytes Absolute: 0.5 10*3/uL (ref 0.1–1.0)
Monocytes Relative: 5 %
Neutro Abs: 5.9 10*3/uL (ref 1.7–7.7)
Neutrophils Relative %: 63 %
Platelets: 338 10*3/uL (ref 150–400)
RBC: 4.26 MIL/uL (ref 3.87–5.11)
RDW: 12.4 % (ref 11.5–15.5)
WBC: 9.4 10*3/uL (ref 4.0–10.5)
nRBC: 0 % (ref 0.0–0.2)

## 2022-02-18 ENCOUNTER — Encounter (INDEPENDENT_AMBULATORY_CARE_PROVIDER_SITE_OTHER): Payer: Self-pay | Admitting: Family Medicine

## 2022-02-18 ENCOUNTER — Ambulatory Visit (INDEPENDENT_AMBULATORY_CARE_PROVIDER_SITE_OTHER): Payer: Managed Care, Other (non HMO) | Admitting: Family Medicine

## 2022-02-18 VITALS — BP 114/81 | HR 81 | Temp 98.2°F | Ht 67.0 in | Wt 272.0 lb

## 2022-02-18 DIAGNOSIS — K219 Gastro-esophageal reflux disease without esophagitis: Secondary | ICD-10-CM | POA: Diagnosis not present

## 2022-02-18 DIAGNOSIS — E559 Vitamin D deficiency, unspecified: Secondary | ICD-10-CM

## 2022-02-18 DIAGNOSIS — Z6841 Body Mass Index (BMI) 40.0 and over, adult: Secondary | ICD-10-CM

## 2022-02-18 DIAGNOSIS — M7989 Other specified soft tissue disorders: Secondary | ICD-10-CM

## 2022-02-18 MED ORDER — TOPIRAMATE 25 MG PO TABS: 25.0000 mg | ORAL_TABLET | Freq: Every day | ORAL | 0 refills | Status: DC

## 2022-02-18 MED ORDER — VITAMIN D (ERGOCALCIFEROL) 1.25 MG (50000 UNIT) PO CAPS: 50000.0000 [IU] | ORAL_CAPSULE | ORAL | 0 refills | Status: DC

## 2022-02-18 NOTE — Assessment & Plan Note (Signed)
Net weight loss 12 pounds in 6 months of medically supervised weight management Reviewed bioimpedance Actively losing body fat Fairly adherent to meal plan despite family stressors Has been increasing number of steps daily. Encouraged tracking steps using a smart watch Lacks insurance coverage for antiobesity medications.

## 2022-02-18 NOTE — Progress Notes (Signed)
Office: [phone]  /  Fax: [phone]  WEIGHT SUMMARY AND BIOMETRICS  Medical Weight Loss Height: 5' 7"$  (1.702 m) Weight: 272 lb (123.4 kg) Temp: 98.2 F (36.8 C) Pulse Rate: 81 BP: 114/81 SpO2: 99 % Fasting: no Labs: no Today's Visit #: 6 Weight at Last VIsit: 273lb Weight Lost Since Last Visit: 1lb  Body Fat %: 49.9 % Fat Mass (lbs): 135.8 lbs Muscle Mass (lbs): 129.6 lbs Total Body Water (lbs): 100.2 lbs Visceral Fat Rating : 13 Starting Date: 08/15/21 Starting Weight: 284lb Total Weight Loss (lbs): 12 lb (5.443 kg)    HPI  Chief Complaint: OBESITY  Janet [name] is here to discuss her progress with her obesity treatment plan. She is on the the Category 3 Plan and states she is following her eating plan approximately 85 % of the time. She states she is walking for 30 minutes 5-6 times per week.  Interval History:  Since last office visit she been going through stress since her dad was in an MVA. Her mom and dad live with her.  Eating out more.  Doing some walking and would like to hike more. Taking the stairs more often.    Pharmacotherapy: none Wegovy denied by insurance  PHYSICAL EXAM:  Blood pressure 114/81, pulse 81, temperature 98.2 F (36.8 C), height 5' 7"$  (1.702 m), weight 272 lb (123.4 kg), SpO2 99 %. Body mass index is 42.6 kg/m.  General: She is overweight, cooperative, alert, well developed, and in no acute distress. PSYCH: Has normal mood, affect and thought process.   HEENT: EOMI, sclerae are anicteric. Lungs: Normal breathing effort, no conversational dyspnea. Extremities: No edema.  Neurologic: No gross sensory or motor deficits. No tremors or fasciculations noted.    DIAGNOSTIC DATA REVIEWED:  BMET    Component Value Date/Time   NA 139 02/13/2022 0929   NA 138 07/27/2021 1402   K 3.5 02/13/2022 0929   CL 108 02/13/2022 0929   CO2 22 02/13/2022 0929   GLUCOSE 97 02/13/2022 0929   BUN 12 02/13/2022 0929   BUN 10 07/27/2021 1402    CREATININE 0.74 02/13/2022 0929   CALCIUM 8.5 (L) 02/13/2022 0929   GFRNONAA >60 02/13/2022 0929   GFRAA 85 12/20/2019 1154   Lab Results  Component Value Date   HGBA1C 5.5 08/15/2021   HGBA1C 5.4 11/22/2019   Lab Results  Component Value Date   INSULIN 13.8 08/15/2021   Lab Results  Component Value Date   TSH 2.730 10/29/2021   CBC    Component Value Date/Time   WBC 9.4 02/13/2022 0929   RBC 4.26 02/13/2022 0929   HGB 12.7 02/13/2022 0929   HGB 13.2 07/27/2021 1402   HCT 37.3 02/13/2022 0929   HCT 39.1 07/27/2021 1402   PLT 338 02/13/2022 0929   PLT 323 07/27/2021 1402   MCV 87.6 02/13/2022 0929   MCV 90 07/27/2021 1402   MCH 29.8 02/13/2022 0929   MCHC 34.0 02/13/2022 0929   RDW 12.4 02/13/2022 0929   RDW 12.0 07/27/2021 1402   Iron Studies    Component Value Date/Time   IRON 61 07/27/2021 1402   TIBC 280 07/27/2021 1402   FERRITIN 24 07/27/2021 1402   IRONPCTSAT 22 07/27/2021 1402   Lipid Panel     Component Value Date/Time   CHOL 184 08/15/2021 1108   TRIG 147 08/15/2021 1108   HDL 36 (L) 08/15/2021 1108   CHOLHDL 6.6 (H) 02/28/2021 1457   LDLCALC 122 (H) 08/15/2021 1108  Hepatic Function Panel     Component Value Date/Time   PROT 7.0 02/28/2021 1457   ALBUMIN 3.6 02/13/2022 0929   ALBUMIN 4.0 07/27/2021 1402   AST 12 02/28/2021 1457   ALT 10 02/28/2021 1457   ALKPHOS 112 02/28/2021 1457   BILITOT 0.4 02/28/2021 1457   BILIDIR 0.1 09/18/2017 1109   IBILI 0.4 09/18/2017 1109      Component Value Date/Time   TSH 2.730 10/29/2021 1608   Nutritional Lab Results  Component Value Date   VD25OH 22.0 (L) 07/27/2021   VD25OH 22.1 (L) 02/28/2021     ASSESSMENT AND PLAN  TREATMENT PLAN FOR OBESITY:  Recommended Dietary Goals  Rocelia is currently in the action stage of change. As such, her goal is to continue weight management plan. She has agreed to the Category 3 Plan.  Behavioral Intervention  We discussed the following Behavioral  Modification Strategies today: increasing lean protein intake, increasing vegetables, increase water intake, work on meal planning and easy cooking plans, and think about ways to increase physical activity.  Additional resources provided today: NA  Recommended Physical Activity Goals  Izzie has been advised to work up to 150 minutes of moderate intensity aerobic activity a week and strengthening exercises 2-3 times per week for cardiovascular health, weight loss maintenance and preservation of muscle mass.   She has agreed to increase physical activity in their day and reduce sedentary time (increase NEAT).  and Will begin regular aerobic exercise 30 minutes, 3 times per week. Chosen activity walking, hiking.   Pharmacotherapy We discussed various medication options to help Tamyah with her weight loss efforts and we both agreed to starting Topamax 25 mg once daily in the morning for food impulse control.  Reviewed potential adverse side effects and increased risk for nephrolithiasis.  Increase water intake to over 64 ounces per day.  Currently on OCPs.  ASSOCIATED CONDITIONS ADDRESSED TODAY  Gastroesophageal reflux disease, unspecified whether esophagitis present Assessment & Plan: Cautious about large food volumes and high acid foods Symptoms well controlled on Omeprazole 20 mg daily. Had a normal gastric emptying study 06/26/20.    Morbid obesity ([place]) Assessment & Plan: Net weight loss 12 pounds in 6 months of medically supervised weight management Reviewed bioimpedance Actively losing body fat Fairly adherent to meal plan despite family stressors Has been increasing number of steps daily. Encouraged tracking steps using a smart watch Lacks insurance coverage for antiobesity medications.   Vitamin D deficiency Assessment & Plan: Taking RX vitamin D 50,000 IU weekly Energy level improving Last vitamin D Lab Results  Component Value Date   VD25OH 22.0 (L) 07/27/2021      Orders: -     Vitamin D (Ergocalciferol); Take 1 capsule (50,000 Units total) by mouth every 7 (seven) days.  Dispense: 12 capsule; Refill: 0  Swelling of both lower extremities Assessment & Plan: Edema has improved in legs Has some hand swelling Taking Furosemide 20 mg 2 x a day and an OTC potassium supplement Not wearing compression socks.  Can try an OTC compression socks    BMI 40.0-44.9, adult ([place])  Other orders -     Topiramate; Take 1 tablet (25 mg total) by mouth daily.  Dispense: 30 tablet; Refill: 0      No follow-ups on file.Marland Kitchen She was informed of the importance of frequent follow up visits to maximize her success with intensive lifestyle modifications for her multiple health conditions.   ATTESTASTION STATEMENTS:  Reviewed by clinician on day of  visit: allergies, medications, problem list, medical history, surgical history, family history, social history, and previous encounter notes.   Time spent on visit including pre-visit chart review and post-visit care and charting was 30 minutes.    Dell Ponto, DO

## 2022-02-18 NOTE — Assessment & Plan Note (Signed)
Taking RX vitamin D 50,000 IU weekly Energy level improving Last vitamin D Lab Results  Component Value Date   VD25OH 22.0 (L) 07/27/2021

## 2022-02-18 NOTE — Assessment & Plan Note (Addendum)
Cautious about large food volumes and high acid foods Symptoms well controlled on Omeprazole 20 mg daily. Had a normal gastric emptying study 06/26/20.

## 2022-02-18 NOTE — Assessment & Plan Note (Signed)
Edema has improved in legs Has some hand swelling Taking Furosemide 20 mg 2 x a day and an OTC potassium supplement Not wearing compression socks.  Can try an OTC compression socks

## 2022-03-11 ENCOUNTER — Ambulatory Visit: Payer: Managed Care, Other (non HMO) | Admitting: Nurse Practitioner

## 2022-03-18 ENCOUNTER — Ambulatory Visit (INDEPENDENT_AMBULATORY_CARE_PROVIDER_SITE_OTHER): Payer: Managed Care, Other (non HMO) | Admitting: Family Medicine

## 2022-03-18 VITALS — BP 136/81 | HR 65 | Temp 98.0°F | Ht 67.0 in | Wt 273.0 lb

## 2022-03-18 DIAGNOSIS — E88819 Insulin resistance, unspecified: Secondary | ICD-10-CM

## 2022-03-18 DIAGNOSIS — R632 Polyphagia: Secondary | ICD-10-CM | POA: Diagnosis not present

## 2022-03-18 DIAGNOSIS — Z6841 Body Mass Index (BMI) 40.0 and over, adult: Secondary | ICD-10-CM

## 2022-03-18 DIAGNOSIS — E559 Vitamin D deficiency, unspecified: Secondary | ICD-10-CM

## 2022-03-18 MED ORDER — VITAMIN D (ERGOCALCIFEROL) 1.25 MG (50000 UNIT) PO CAPS: 50000.0000 [IU] | ORAL_CAPSULE | ORAL | 0 refills | Status: DC

## 2022-03-18 MED ORDER — TOPIRAMATE 50 MG PO TABS: 50.0000 mg | ORAL_TABLET | Freq: Two times a day (BID) | ORAL | 0 refills | Status: DC

## 2022-03-18 NOTE — Progress Notes (Signed)
Office: [phone]  /  Fax: [phone]  WEIGHT SUMMARY AND BIOMETRICS  Vitals Temp: 62 F (36.7 C) BP: 136/81 Pulse Rate: 65 SpO2: 98 %   Anthropometric Measurements Height: '5\' 7"'$  (1.702 m) Weight: 273 lb (123.8 kg) BMI (Calculated): 42.75 Weight at Last Visit: 272lb Weight Lost Since Last Visit: +1 Starting Weight: 284lb Total Weight Loss (lbs): 11 lb (4.99 kg)   Body Composition  Body Fat %: 51 % Fat Mass (lbs): 139.4 lbs Muscle Mass (lbs): 127.2 lbs Total Body Water (lbs): 101.8 lbs Visceral Fat Rating : 13   Other Clinical Data Fasting: no Labs: no Today's Visit #: 7 Starting Date: 08/15/21     HPI  Chief Complaint: OBESITY  Janet [name] is here to discuss her progress with her obesity treatment plan. She is on the the Category 3 Plan and states she is following her eating plan approximately 83 % of the time. She states she is exercising 30 minutes 4-5 times per week.   Interval History:  Since last office visit she is up 1 lb She did start Topamax 25 mg once a day (AM) for food impulse control Doesn't feel any effectiveness or side effects Has work - related stress but did get a promotion at work Her mom has been supportive She has been mindful of her food choices and meal planning She has not added in any consistent exercise Her net weight loss since starting our program 7 months ago is 11 pounds. This is a 3.8% total body weight loss.  Pharmacotherapy: Topiramate   PHYSICAL EXAM:  Blood pressure 136/81, pulse 65, temperature 98 F (36.7 C), height '5\' 7"'$  (1.702 m), weight 273 lb (123.8 kg), SpO2 98 %. Body mass index is 42.76 kg/m.  General: She is overweight, cooperative, alert, well developed, and in no acute distress. PSYCH: Has normal mood, affect and thought process.   Lungs: Normal breathing effort, no conversational dyspnea.   ASSESSMENT AND PLAN  TREATMENT PLAN FOR OBESITY:  Recommended Dietary Goals  Janet [name] is currently in  the action stage of change. As such, her goal is to continue weight management plan. She has agreed to the Category 3 Plan.  Behavioral Intervention  We discussed the following Behavioral Modification Strategies today: increasing lean protein intake, increasing vegetables, increasing water intake, work on meal planning and easy cooking plans, decreasing eating out, consumption of processed foods, and making healthy choices when eating convenient foods, reading food labels , and work on managing stress, creating time for self-care and relaxation measures.  Additional resources provided today: NA  Recommended Physical Activity Goals  Janet [name] has been advised to work up to 150 minutes of moderate intensity aerobic activity a week and strengthening exercises 2-3 times per week for cardiovascular health, weight loss maintenance and preservation of muscle mass.   She has agreed to Will begin regular aerobic exercise 30 minutes, 5 times per week. Chosen activity walking.  Pharmacotherapy changes for the treatment of obesity: Topiramate, increase to 50 mg once daily for 2 weeks then 50 mg twice daily for food impulse control and cravings -She agrees to backup birth control with condoms  ASSOCIATED CONDITIONS ADDRESSED TODAY  Insulin resistance Assessment & Plan: Recheck A1c today.  She has successfully reduced her intake of added sugar and refined carbohydrates.  She has been counseled on reading labels on food and drink for added sugar, avoiding products with over 8 g of sugar per serving.  Encouraged more routine walking and tracking of steps.  Orders: -  Hemoglobin A1c  Vitamin D deficiency Assessment & Plan: Last vitamin D Lab Results  Component Value Date   VD25OH 22.0 (L) 07/27/2021   Janet [name] has been taking prescription vitamin D 50,000 IU once weekly.  She is due for recheck level today with a target goal 50-70.  Energy level is starting to improve.  Orders: -     Vitamin D  (Ergocalciferol); Take 1 capsule (50,000 Units total) by mouth every 7 (seven) days.  Dispense: 12 capsule; Refill: 0 -     VITAMIN D 25 Hydroxy (Vit-D Deficiency, Fractures) -     VITAMIN D 25 Hydroxy (Vit-D Deficiency, Fractures)  BMI 40.0-44.9, adult ([facility])  Morbid obesity ([place]) Assessment & Plan: Patient's weight loss has been slower than anticipated.  She reports fair compliance with dietary intervention and has room for improvement with regular exercise.  Job stress has been a barrier to her success and Mancel Parsons was denied by her insurance.   Polyphagia -     Topiramate; Take 1 tablet (50 mg total) by mouth 2 (two) times daily.  Dispense: 60 tablet; Refill: 0      She was informed of the importance of frequent follow up visits to maximize her success with intensive lifestyle modifications for her multiple health conditions.   ATTESTASTION STATEMENTS:  Reviewed by clinician on day of visit: allergies, medications, problem list, medical history, surgical history, family history, social history, and previous encounter notes pertinent to obesity diagnosis.   I have personally spent 30 minutes total time today in preparation, patient care, nutritional counseling and documentation for this visit, including the following: review of clinical lab tests; review of medical tests/procedures/services.      Dell Ponto, DO DABFM, DABOM [facility] [address] [place], [place] [address] [phone]

## 2022-03-18 NOTE — Assessment & Plan Note (Signed)
Recheck A1c today.  She has successfully reduced her intake of added sugar and refined carbohydrates.  She has been counseled on reading labels on food and drink for added sugar, avoiding products with over 8 g of sugar per serving.  Encouraged more routine walking and tracking of steps.

## 2022-03-18 NOTE — Assessment & Plan Note (Signed)
Last vitamin D Lab Results  Component Value Date   VD25OH 22.0 (L) 07/27/2021   Janet [name] has been taking prescription vitamin D 50,000 IU once weekly.  She is due for recheck level today with a target goal 50-70.  Energy level is starting to improve.

## 2022-03-18 NOTE — Assessment & Plan Note (Signed)
Patient's weight loss has been slower than anticipated.  She reports fair compliance with dietary intervention and has room for improvement with regular exercise.  Job stress has been a barrier to her success and Mancel Parsons was denied by her insurance.

## 2022-03-19 ENCOUNTER — Other Ambulatory Visit: Payer: Self-pay | Admitting: Family Medicine

## 2022-03-19 DIAGNOSIS — K449 Diaphragmatic hernia without obstruction or gangrene: Secondary | ICD-10-CM

## 2022-03-19 DIAGNOSIS — K219 Gastro-esophageal reflux disease without esophagitis: Secondary | ICD-10-CM

## 2022-03-19 LAB — VITAMIN D 25 HYDROXY (VIT D DEFICIENCY, FRACTURES): Vit D, 25-Hydroxy: 26.6 ng/mL — ABNORMAL LOW (ref 30.0–100.0)

## 2022-03-19 LAB — HEMOGLOBIN A1C
Est. average glucose Bld gHb Est-mCnc: 111 mg/dL
Hgb A1c MFr Bld: 5.5 % (ref 4.8–5.6)

## 2022-03-22 ENCOUNTER — Other Ambulatory Visit: Payer: Managed Care, Other (non HMO)

## 2022-03-23 LAB — T4, FREE: Free T4: 1.77 ng/dL (ref 0.82–1.77)

## 2022-03-23 LAB — TSH: TSH: <0.005 u[IU]/mL — ABNORMAL LOW (ref 0.450–4.500)

## 2022-03-27 NOTE — Patient Instructions (Signed)

## 2022-03-28 ENCOUNTER — Encounter: Payer: Self-pay | Admitting: Nurse Practitioner

## 2022-03-28 ENCOUNTER — Ambulatory Visit: Payer: Managed Care, Other (non HMO) | Admitting: Nurse Practitioner

## 2022-03-28 VITALS — BP 119/80 | HR 76 | Ht 67.0 in | Wt 279.2 lb

## 2022-03-28 DIAGNOSIS — E038 Other specified hypothyroidism: Secondary | ICD-10-CM

## 2022-03-28 DIAGNOSIS — E063 Autoimmune thyroiditis: Secondary | ICD-10-CM | POA: Diagnosis not present

## 2022-03-28 MED ORDER — LEVOTHYROXINE SODIUM 100 MCG PO TABS: 100.0000 ug | ORAL_TABLET | Freq: Every day | ORAL | 3 refills | Status: DC

## 2022-03-28 MED ORDER — LEVOTHYROXINE SODIUM 112 MCG PO TABS: 112.0000 ug | ORAL_TABLET | Freq: Every day | ORAL | 3 refills | Status: DC

## 2022-03-28 NOTE — Progress Notes (Signed)
Endocrinology Follow Up Note                                         03/28/2022, 8:13 AM  Subjective:   Subjective    Janet [name] is a 27 y.o.-year-old female patient being seen in follow up after being seen in consultation for hypothyroidism referred by [name], DO.   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   Hypothyroidism    Kidney problem    Sleep apnea    Swallowing difficulty    Thyroid disease    Hashimotos    Vitamin D deficiency     Past Surgical History:  Procedure Laterality Date   BIOPSY  05/09/2020   Procedure: BIOPSY;  Surgeon: Harvel Quale, MD;  Location: [facility];  Service: Gastroenterology;;   CHOLECYSTECTOMY N/A 05/15/2020   Procedure: LAPAROSCOPIC CHOLECYSTECTOMY;  Surgeon: Virl Cagey, MD;  Location: [facility];  Service: General;  Laterality: N/A;   ESOPHAGOGASTRODUODENOSCOPY (EGD) WITH PROPOFOL N/A 05/09/2020   Procedure: ESOPHAGOGASTRODUODENOSCOPY (EGD) WITH PROPOFOL;  Surgeon: Harvel Quale, MD;  Location: [facility];  Service: Gastroenterology;  Laterality: N/A;  AM   THYROIDECTOMY N/A 07/01/2017   Procedure: TOTAL THYROIDECTOMY;  Surgeon: [name], MD;  Location: [facility];  Service: General;  Laterality: N/A;   TONSILLECTOMY AND ADENOIDECTOMY Bilateral 01/2011   TOTAL THYROIDECTOMY     Dr. [name] 07-01-17    Social History   Socioeconomic History   Marital status: Single    Spouse name: Not on file   Number of children: Not on file   Years of education: Not on file   Highest education level: Not on file  Occupational History   Occupation: Scientist, water quality    Comment: Customer service    Occupation: Patient Registration  Tobacco Use   Smoking status: Some Days    Packs/day: 0.50    Years: 2.00    Additional pack years: 0.00    Total pack years: 1.00    Types: Cigarettes   Smokeless tobacco: Never  Vaping Use   Vaping Use: Never used  Substance and Sexual Activity   Alcohol use: Not Currently    Comment: rare   Drug use: No   Sexual activity: Not Currently    Birth control/protection: None, Pill  Other Topics Concern   Not on file  Social History Narrative   Not on file   Social Determinants of Health   Financial Resource Strain: Medium Risk (04/07/2019)   Overall Financial Resource Strain (CARDIA)    Difficulty of Paying Living Expenses: Somewhat hard  Food Insecurity: No Food Insecurity (04/07/2019)   Hunger Vital  Sign    Worried About Charity fundraiser in the Last Year: Never true    [place] in the Last Year: Never true  Transportation Needs: No Transportation Needs (04/07/2019)   PRAPARE - Hydrologist (Medical): No    Lack of Transportation (Non-Medical): No  Physical Activity: Insufficiently Active (04/07/2019)   Exercise Vital Sign    Days of Exercise per Week: 4 days    Minutes of Exercise per Session: 30 min  Stress: Stress Concern Present (04/07/2019)   [place]    Feeling of Stress : Rather much  Social Connections: Moderately Isolated (04/07/2019)   Social Connection and Isolation Panel [NHANES]    Frequency of Communication with Friends and Family: More than three times a week    Frequency of Social Gatherings with Friends and Family: Once a week    Attends Religious Services: 1 to 4 times per year    Active Member of [employer] or Organizations: No    Attends Music therapist: Never    Marital Status: Never married    Family History  Problem Relation Age of Onset   Hyperlipidemia Mother    Hypertension Mother    Diabetes  Mother    Other Mother        gastropresis   Anxiety disorder Mother    Sleep apnea Mother    Other Father        syncope-has a pacemaker   COPD Father    Heart disease Father    Heart attack Maternal Grandmother    Heart Problems Maternal Grandfather    Heart attack Maternal Grandfather    Cancer Paternal Grandmother    Cancer Paternal Grandfather     Outpatient Encounter Medications as of 03/28/2022  Medication Sig   escitalopram (LEXAPRO) 20 MG tablet Take 1 tablet (20 mg total) by mouth at bedtime.   ferrous sulfate 324 (65 Fe) MG TBEC Take 1 tablet (324 mg total) by mouth daily. (Patient taking differently: Take 324 mg by mouth at bedtime.)   furosemide (LASIX) 20 MG tablet ALTERNATE 1 OR 2 TABLETS EVERY OTHER DAY AS DIRECTED   ibuprofen (ADVIL) 200 MG tablet Take 400-600 mg by mouth every 6 (six) hours as needed for headache.   norethindrone-ethinyl estradiol-FE (LOESTRIN FE) 1-20 MG-MCG tablet Take 1 tablet by mouth daily.   omeprazole (PRILOSEC) 20 MG capsule Take 1 capsule (20 mg total) by mouth daily. (NEEDS TO BE SEEN BEFORE NEXT REFILL)   ondansetron (ZOFRAN) 4 MG tablet Take 1 tablet (4 mg total) by mouth every 8 (eight) hours as needed for nausea or vomiting.   SF 5000 PLUS 1.1 % CREA dental cream Take by mouth every morning.   topiramate (TOPAMAX) 50 MG tablet Take 1 tablet (50 mg total) by mouth 2 (two) times daily.   Vitamin D, Ergocalciferol, (DRISDOL) 1.25 MG (50000 UNIT) CAPS capsule Take 1 capsule (50,000 Units total) by mouth every 7 (seven) days.   [DISCONTINUED] levothyroxine (SYNTHROID) 100 MCG tablet Take 1 tablet (100 mcg total) by mouth daily before breakfast.   [DISCONTINUED] levothyroxine (SYNTHROID) 112 MCG tablet Take 1 tablet (112 mcg total) by mouth daily.   levothyroxine (SYNTHROID) 100 MCG tablet Take 1 tablet (100 mcg total) by mouth daily before breakfast.   levothyroxine (SYNTHROID) 112 MCG tablet Take 1 tablet (112 mcg total) by mouth daily.    metoCLOPramide (REGLAN) 5 MG tablet Take 1  tablet (5 mg total) by mouth 4 (four) times daily -  before meals and at bedtime.   No facility-administered encounter medications on file as of 03/28/2022.    ALLERGIES: No Known Allergies VACCINATION STATUS: Immunization History  Administered Date(s) Administered   DTaP 08/14/1994, 10/09/1994, 12/11/1994, 09/23/1995, 08/08/1999   HIB (PRP-OMP) 08/14/1994, 10/09/1994, 12/11/1994, 09/23/1995   HPV Quadrivalent 10/02/2005, 12/02/2005, 07/31/2009   Hepatitis B 03-06-94, 08/14/1994, 12/11/1994   IPV 08/14/1994, 10/09/1994, 12/11/1994, 08/08/1999   Influenza,inj,Quad PF,6+ Mos 10/26/2012, 01/21/2014, 11/25/2014, 10/16/2016, 10/03/2017, 08/31/2018, 11/22/2019, 11/14/2020   Influenza-Unspecified 08/31/2018   MMR 06/17/1995, 08/08/1999   Moderna Sars-Covid-2 Vaccination 05/24/2019, 06/21/2019, 01/25/2020   Td 07/31/2009, 11/22/2019   Tdap 07/31/2009, 01/22/2016   Varicella 09/23/1995     HPI   Janet [name]  is a patient with the above medical history. she was diagnosed with Hashimoto's thyroiditis complicated by large goiter at approximate age of 40 years, which required total thyroidectomy and subsequent initiation of thyroid hormone replacement. she was given various doses of thyroid hormone over the years, currently on Levothyroxine 212 micrograms. she reports compliance to this medication:  Taking it daily on empty stomach  with water, separated by >30 minutes before breakfast and other medications, and by at least 4 hours from calcium, iron, PPIs, multivitamins .  I reviewed patient's thyroid tests:  Lab Results  Component Value Date   TSH <0.005 (L) 03/22/2022   TSH 2.730 10/29/2021   TSH 0.616 07/27/2021   TSH 6.440 (H) 04/16/2021   TSH 60.500 (H) 02/28/2021   TSH 301.000 (H) 12/20/2019   TSH 209.200 (H) 10/03/2017   TSH 9.04 (H) 04/16/2017   TSH 13.34 (H) 12/25/2016   TSH 10.57 (H) 10/16/2016   FREET4 1.77 03/22/2022   FREET4  1.48 10/29/2021   FREET4 1.26 07/27/2021   FREET4 1.04 04/16/2021   FREET4 0.80 (L) 02/28/2021   FREET4 0.56 (L) 04/16/2017   FREET4 0.64 12/25/2016   FREET4 0.71 10/16/2016   FREET4 0.83 02/26/2016   FREET4 0.48 (L) 11/02/2015    Pt denies feeling nodules in neck, hoarseness, dysphagia/odynophagia, SOB with lying down.  she denies family history of thyroid disorders.  No family history of thyroid cancer.  No history of radiation therapy to head or neck.  No recent use of iodine supplements.  Denies use of Biotin containing supplements.  I reviewed her chart and she also has a history of anemia, CKD, GERD, OSA.   Review of systems  Constitutional: + Minimally fluctuating body weight,  current Body mass index is 43.73 kg/m. , no fatigue, no subjective hyperthermia, no subjective hypothermia Eyes: no blurry vision, no xerophthalmia ENT: no sore throat, no nodules palpated in throat, no dysphagia/odynophagia, no hoarseness Cardiovascular: no chest pain, no shortness of breath, no palpitations, no leg swelling Respiratory: no cough, no shortness of breath Gastrointestinal: no nausea/vomiting, + intermittent diarrhea (r/t gall bladder removed) Musculoskeletal: no muscle/joint aches Skin: no rashes, no hyperemia Neurological: no tremors, no numbness, no tingling, no dizziness Psychiatric: no depression, no anxiety   Objective:   Objective     BP 119/80 (BP Location: Left Arm, Patient Position: Sitting, Cuff Size: Large)   Pulse 76   Ht 5\' 7"  (1.702 m)   Wt 279 lb 3.2 oz (126.6 kg)   BMI 43.73 kg/m  Wt Readings from Last 3 Encounters:  03/28/22 279 lb 3.2 oz (126.6 kg)  03/18/22 273 lb (123.8 kg)  02/18/22 272 lb (123.4 kg)    BP Readings from Last  3 Encounters:  03/28/22 119/80  03/18/22 136/81  02/18/22 114/81      Physical Exam- Limited  Constitutional:  Body mass index is 43.73 kg/m. , not in acute distress, normal state of mind Eyes:  EOMI, no  exophthalmos Thyroid: No gross goiter- surgical scar from previous total thyroidectomy Musculoskeletal: no gross deformities, strength intact in all four extremities, no gross restriction of joint movements Skin:  no rashes, no hyperemia Neurological: no tremor with outstretched hands   CMP ( most recent) CMP     Component Value Date/Time   NA 139 02/13/2022 0929   NA 138 07/27/2021 1402   K 3.5 02/13/2022 0929   CL 108 02/13/2022 0929   CO2 22 02/13/2022 0929   GLUCOSE 97 02/13/2022 0929   BUN 12 02/13/2022 0929   BUN 10 07/27/2021 1402   CREATININE 0.74 02/13/2022 0929   CALCIUM 8.5 (L) 02/13/2022 0929   PROT 7.0 02/28/2021 1457   ALBUMIN 3.6 02/13/2022 0929   ALBUMIN 4.0 07/27/2021 1402   AST 12 02/28/2021 1457   ALT 10 02/28/2021 1457   ALKPHOS 112 02/28/2021 1457   BILITOT 0.4 02/28/2021 1457   GFRNONAA >60 02/13/2022 0929   GFRAA 85 12/20/2019 1154     Diabetic Labs (most recent): Lab Results  Component Value Date   HGBA1C 5.5 03/18/2022   HGBA1C 5.5 08/15/2021   HGBA1C 5.1 02/28/2021     Lipid Panel ( most recent) Lipid Panel     Component Value Date/Time   CHOL 184 08/15/2021 1108   TRIG 147 08/15/2021 1108   HDL 36 (L) 08/15/2021 1108   CHOLHDL 6.6 (H) 02/28/2021 1457   LDLCALC 122 (H) 08/15/2021 1108   LABVLDL 26 08/15/2021 1108       Lab Results  Component Value Date   TSH <0.005 (L) 03/22/2022   TSH 2.730 10/29/2021   TSH 0.616 07/27/2021   TSH 6.440 (H) 04/16/2021   TSH 60.500 (H) 02/28/2021   TSH 301.000 (H) 12/20/2019   TSH 209.200 (H) 10/03/2017   TSH 9.04 (H) 04/16/2017   TSH 13.34 (H) 12/25/2016   TSH 10.57 (H) 10/16/2016   FREET4 1.77 03/22/2022   FREET4 1.48 10/29/2021   FREET4 1.26 07/27/2021   FREET4 1.04 04/16/2021   FREET4 0.80 (L) 02/28/2021   FREET4 0.56 (L) 04/16/2017   FREET4 0.64 12/25/2016   FREET4 0.71 10/16/2016   FREET4 0.83 02/26/2016   FREET4 0.48 (L) 11/02/2015      Latest Reference Range & Units  02/28/21 14:57 04/16/21 14:28 07/27/21 14:02 10/29/21 16:08 03/22/22 12:50  TSH 0.450 - 4.500 uIU/mL 60.500 (H) 6.440 (H) 0.616 2.730 <0.005 (L)  T4,Free(Direct) 0.82 - 1.77 ng/dL 0.80 (L) 1.04 1.26 1.48 1.77  (H): Data is abnormally high (L): Data is abnormally low Assessment & Plan:   ASSESSMENT / PLAN:  1. Hypothyroidism-S/P total thyroidectomy for goiter r/t Hashimoto's  Her most recent thyroid labs are consistent with appropriate hormone replacement (TSH suppressed and FT4 on upper end of normal but she denies any symptoms of over-replacement).  She is advised to continue her Levothyroxine 212 mcg (1 of 100 mcg and 1 of 112 mcg tabs) po daily before breakfast.  Will recheck labs in 4 months for surveillance.  - We discussed about correct intake of levothyroxine, at fasting, with water, separated by at least 30 minutes from breakfast, and separated by more than 4 hours from calcium, iron, multivitamins, acid reflux medications (PPIs). -Patient is made aware of the fact that thyroid  hormone replacement is needed for life, dose to be adjusted by periodic monitoring of thyroid function tests.  -Her thyroid ultrasound shows no residual thyroid tissue after total thyroidectomy.  No need for further imaging at this time.      I spent  20  minutes in the care of the patient today including review of labs from Thyroid Function, CMP, and other relevant labs ; imaging/biopsy records (current and previous including abstractions from other facilities); face-to-face time discussing  her lab results and symptoms, medications doses, her options of short and long term treatment based on the latest standards of care / guidelines;   and documenting the encounter.  Janet [name]  participated in the discussions, expressed understanding, and voiced agreement with the above plans.  All questions were answered to her satisfaction. she is encouraged to contact clinic should she have any questions or concerns  prior to her return visit.   FOLLOW UP PLAN:  Return in about 4 months (around 07/28/2022) for Thyroid follow up, Previsit labs.  Rayetta Pigg, [facility] [address] [place], [place] [address] Phone: [phone] Fax: [phone]  03/28/2022, 8:13 AM

## 2022-04-15 ENCOUNTER — Encounter (INDEPENDENT_AMBULATORY_CARE_PROVIDER_SITE_OTHER): Payer: Self-pay | Admitting: Adult Health

## 2022-04-15 ENCOUNTER — Ambulatory Visit (INDEPENDENT_AMBULATORY_CARE_PROVIDER_SITE_OTHER): Payer: Managed Care, Other (non HMO) | Admitting: Adult Health

## 2022-04-15 VITALS — BP 125/84 | HR 87 | Temp 98.3°F | Ht 67.0 in | Wt 269.0 lb

## 2022-04-15 DIAGNOSIS — E559 Vitamin D deficiency, unspecified: Secondary | ICD-10-CM

## 2022-04-15 DIAGNOSIS — E669 Obesity, unspecified: Secondary | ICD-10-CM | POA: Diagnosis not present

## 2022-04-15 DIAGNOSIS — Z6841 Body Mass Index (BMI) 40.0 and over, adult: Secondary | ICD-10-CM

## 2022-04-15 DIAGNOSIS — N182 Chronic kidney disease, stage 2 (mild): Secondary | ICD-10-CM | POA: Diagnosis not present

## 2022-04-15 DIAGNOSIS — R632 Polyphagia: Secondary | ICD-10-CM

## 2022-04-15 MED ORDER — VITAMIN D (ERGOCALCIFEROL) 1.25 MG (50000 UNIT) PO CAPS: 50000.0000 [IU] | ORAL_CAPSULE | ORAL | 0 refills | Status: DC

## 2022-04-15 MED ORDER — TOPIRAMATE 50 MG PO TABS: 50.0000 mg | ORAL_TABLET | Freq: Two times a day (BID) | ORAL | 0 refills | Status: DC

## 2022-04-15 NOTE — Progress Notes (Signed)
WEIGHT SUMMARY AND BIOMETRICS  Vitals Temp: 98.3 F (36.8 C) BP: 125/84 Pulse Rate: 87 SpO2: 98 %   Anthropometric Measurements Height: 5\' 7"  (1.702 m) Weight: 269 lb (122 kg) BMI (Calculated): 42.12 Weight at Last Visit: 273lb Weight Lost Since Last Visit: 4lb Weight Gained Since Last Visit: 0 Starting Weight: 284lb Total Weight Loss (lbs): 15 lb (6.804 kg)   Body Composition  Body Fat %: 49.1 % Fat Mass (lbs): 132.2 lbs Muscle Mass (lbs): 130 lbs Total Body Water (lbs): 97.4 lbs Visceral Fat Rating : 13   Other Clinical Data Fasting: No Labs: No Today's Visit #: 8 Starting Date: 08/15/21    Chief Complaint:   OBESITY Janet [name] is here to discuss her progress with her obesity treatment plan. She is on the the Category 3 Plan and states she is following her eating plan approximately 84 % of the time.  She states she is exercising walking 30-45 minutes 5 times per week.   Interim History:  She is strives 6-8K steps a day at work. She works in Arts administrator at [employer]  She is weaning off tobacco. 4 cigarettes/day work days. 6-8 cigarettes/day off days.  Of note- her mother "Marylu Lund" at [facility] during OV.  Subjective:   1. Vitamin D deficiency Discussed Labs  Latest Reference Range & Units 03/18/22 15:24  Vitamin D, 25-Hydroxy 30.0 - 100.0 ng/mL 26.6 (L)  (L): Data is abnormally low She is on weekly Ergocalciferol- denies N/V/Muscle Weakness  2. Polyphagia Discussed Labs  Latest Reference Range & Units 03/18/22 15:24  Hemoglobin A1C 4.8 - 5.6 % 5.5  Est. average glucose Bld gHb Est-mCnc mg/dL 982   Started on Topamax 25mg  on 02/18/2022 03/18/2022 Topiramate, increase to 50 mg once daily for 2 weeks then 50 mg twice daily for food impulse control and cravings   She denies hx of seizure d/o or hx of recurrent nephrolithiasis. She had one episode of nephrolithiasis 2 years ago.  She reports 100% compliance with daily oral Loestrin Fe  3. CKD  (chronic kidney disease) stage 2, GFR 60-89 ml/min Followed by [facility] Dr. Randa Lynn   Latest Reference Range & Units 02/13/22 09:29  GFR, Estimated >60 mL/min >60    Latest Reference Range & Units 02/13/22 09:29  Creatinine 0.44 - 1.00 mg/dL 6.41     Assessment/Plan:   1. Vitamin D deficiency Refill - Vitamin D, Ergocalciferol, (DRISDOL) 1.25 MG (50000 UNIT) CAPS capsule; Take 1 capsule (50,000 Units total) by mouth every 7 (seven) days.  Dispense: 12 capsule; Refill: 0  2. Polyphagia Refill - topiramate (TOPAMAX) 50 MG tablet; Take 1 tablet (50 mg total) by mouth 2 (two) times daily.  Dispense: 60 tablet; Refill: 0  3. CKD (chronic kidney disease) stage 2, GFR 60-89 ml/min Avoid Nephrotoxic substances  4. Obesity,current BMI 42.12  Jelene is currently in the action stage of change. As such, her goal is to continue with weight loss efforts. She has agreed to the Category 3 Plan.   Exercise goals: For substantial health benefits, adults should do at least 150 minutes (2 hours and 30 minutes) a week of moderate-intensity, or 75 minutes (1 hour and 15 minutes) a week of vigorous-intensity aerobic physical activity, or an equivalent combination of moderate- and vigorous-intensity aerobic activity. Aerobic activity should be performed in episodes of at least 10 minutes, and preferably, it should be spread throughout the week.  Behavioral modification strategies: increasing lean protein intake, decreasing simple carbohydrates,  increasing vegetables, increasing water intake, no skipping meals, meal planning and cooking strategies, better snacking choices, and planning for success.  Oluwadamilola has agreed to follow-up with our clinic in 4 weeks. She was informed of the importance of frequent follow-up visits to maximize her success with intensive lifestyle modifications for her multiple health conditions.   Objective:   Blood pressure 125/84, pulse 87,  temperature 98.3 F (36.8 C), height 5\' 7"  (1.702 m), weight 269 lb (122 kg), SpO2 98 %. Body mass index is 42.13 kg/m.  General: Cooperative, alert, well developed, in no acute distress. HEENT: Conjunctivae and lids unremarkable. Cardiovascular: Regular rhythm.  Lungs: Normal work of breathing. Neurologic: No focal deficits.   Lab Results  Component Value Date   CREATININE 0.74 02/13/2022   BUN 12 02/13/2022   NA 139 02/13/2022   K 3.5 02/13/2022   CL 108 02/13/2022   CO2 22 02/13/2022   Lab Results  Component Value Date   ALT 10 02/28/2021   AST 12 02/28/2021   ALKPHOS 112 02/28/2021   BILITOT 0.4 02/28/2021   Lab Results  Component Value Date   HGBA1C 5.5 03/18/2022   HGBA1C 5.5 08/15/2021   HGBA1C 5.1 02/28/2021   HGBA1C 5.4 11/22/2019   Lab Results  Component Value Date   INSULIN 13.8 08/15/2021   Lab Results  Component Value Date   TSH <0.005 (L) 03/22/2022   Lab Results  Component Value Date   CHOL 184 08/15/2021   HDL 36 (L) 08/15/2021   LDLCALC 122 (H) 08/15/2021   TRIG 147 08/15/2021   CHOLHDL 6.6 (H) 02/28/2021   Lab Results  Component Value Date   VD25OH 26.6 (L) 03/18/2022   VD25OH 22.0 (L) 07/27/2021   VD25OH 22.1 (L) 02/28/2021   Lab Results  Component Value Date   WBC 9.4 02/13/2022   HGB 12.7 02/13/2022   HCT 37.3 02/13/2022   MCV 87.6 02/13/2022   PLT 338 02/13/2022   Lab Results  Component Value Date   IRON 61 07/27/2021   TIBC 280 07/27/2021   FERRITIN 24 07/27/2021   Attestation Statements:   Reviewed by clinician on day of visit: allergies, medications, problem list, medical history, surgical history, family history, social history, and previous encounter notes.  I have reviewed the above documentation for accuracy and completeness, and I agree with the above. -  Ivori Storr d. Marsel Gail, NP-C

## 2022-05-13 ENCOUNTER — Ambulatory Visit (INDEPENDENT_AMBULATORY_CARE_PROVIDER_SITE_OTHER): Payer: Managed Care, Other (non HMO) | Admitting: Adult Health

## 2022-05-13 ENCOUNTER — Encounter (INDEPENDENT_AMBULATORY_CARE_PROVIDER_SITE_OTHER): Payer: Self-pay | Admitting: Adult Health

## 2022-05-13 VITALS — BP 137/81 | HR 79 | Temp 98.0°F | Ht 67.0 in | Wt 273.0 lb

## 2022-05-13 DIAGNOSIS — E559 Vitamin D deficiency, unspecified: Secondary | ICD-10-CM | POA: Diagnosis not present

## 2022-05-13 DIAGNOSIS — R632 Polyphagia: Secondary | ICD-10-CM

## 2022-05-13 DIAGNOSIS — Z6841 Body Mass Index (BMI) 40.0 and over, adult: Secondary | ICD-10-CM | POA: Diagnosis not present

## 2022-05-13 DIAGNOSIS — E669 Obesity, unspecified: Secondary | ICD-10-CM

## 2022-05-13 DIAGNOSIS — R112 Nausea with vomiting, unspecified: Secondary | ICD-10-CM

## 2022-05-13 MED ORDER — TOPIRAMATE 50 MG PO TABS: 50.0000 mg | ORAL_TABLET | Freq: Two times a day (BID) | ORAL | 0 refills | Status: DC

## 2022-05-13 MED ORDER — VITAMIN D (ERGOCALCIFEROL) 1.25 MG (50000 UNIT) PO CAPS: 50000.0000 [IU] | ORAL_CAPSULE | ORAL | 0 refills | Status: DC

## 2022-05-13 NOTE — Progress Notes (Signed)
WEIGHT SUMMARY AND BIOMETRICS  Vitals Temp: 98 F (36.7 C) BP: 137/81 Pulse Rate: 79 SpO2: 100 %   Anthropometric Measurements Height: 5\' 7"  (1.702 m) Weight: 273 lb (123.8 kg) BMI (Calculated): 42.75 Weight at Last Visit: 269lb Weight Lost Since Last Visit: 0 Weight Gained Since Last Visit: 4lb Starting Weight: 284lb Total Weight Loss (lbs): 11 lb (4.99 kg)   Body Composition  Body Fat %: 50.4 % Fat Mass (lbs): 137.3 lbs Muscle Mass (lbs): 128.8 lbs Total Body Water (lbs): 102 lbs Visceral Fat Rating : 13   Other Clinical Data Fasting: no Labs: no Today's Visit #: 9 Starting Date: 08/15/21    Chief Complaint:   OBESITY Janet [name] is here to discuss her progress with her obesity treatment plan. She is on the the Category 3 Plan and states she is following her eating plan approximately 83 % of the time. She states she is exercising Walking 30-45 minutes 5-6 times per week.   Interim History:  Her father underwent R shoulder surgery- rotator cuff repair, ligament repair.  He is R hand dominant and requires assistant with ADLs.  [name] has been incorporating Premier Protein shakes into her daily routine, to boost daily protein intake.  She has been bringing a large water bottle into work to encourage more hydration.  Of Note- PCP requires f/u for medication refills- contact information provided to pt. Her mother is at [facility] during OV  Subjective:   1. Polyphagia Started on Topamax 25mg  on 02/18/2022 03/18/2022 Topiramate, increase to 50 mg once daily for 2 weeks then 50 mg - has been on BID consistently since then and tolerating well.  2. Vitamin D deficiency  Latest Reference Range & Units 02/28/21 14:57 07/27/21 14:02 03/18/22 15:24  Vitamin D, 25-Hydroxy 30.0 - 100.0 ng/mL 22.1 (L) 22.0 (L) 26.6 (L)  (L): Data is abnormally low Vit D level subtherapuetic She endorses stable energy levels.  Assessment/Plan:   1. Polyphagia Refill - topiramate  (TOPAMAX) 50 MG tablet; Take 1 tablet (50 mg total) by mouth 2 (two) times daily.  Dispense: 60 tablet; Refill: 0  2. Vitamin D deficiency Refill - Vitamin D, Ergocalciferol, (DRISDOL) 1.25 MG (50000 UNIT) CAPS capsule; Take 1 capsule (50,000 Units total) by mouth every 7 (seven) days.  Dispense: 12 capsule; Refill: 0 Recommend increasing if level remains below <40 at next lab check  3. Obesity,current BMI 42.75  [name] is currently in the action stage of change. As such, her goal is to continue with weight loss efforts. She has agreed to the Category 3 Plan.   Exercise goals: For substantial health benefits, adults should do at least 150 minutes (2 hours and 30 minutes) a week of moderate-intensity, or 75 minutes (1 hour and 15 minutes) a week of vigorous-intensity aerobic physical activity, or an equivalent combination of moderate- and vigorous-intensity aerobic activity. Aerobic activity should be performed in episodes of at least 10 minutes, and preferably, it should be spread throughout the week.  Behavioral modification strategies: increasing lean protein intake, decreasing simple carbohydrates, increasing vegetables, increasing water intake, no skipping meals, meal planning and cooking strategies, keeping healthy foods in the home, avoiding temptations, planning for success, and decreasing junk food.  Carling has agreed to follow-up with our clinic in 4 weeks. She was informed of the importance of frequent follow-up visits to maximize her success with intensive lifestyle modifications for her multiple health conditions.   Objective:   Blood pressure 137/81, pulse 79, temperature 98  F (36.7 C), height 5\' 7"  (1.702 m), weight 273 lb (123.8 kg), SpO2 100 %. Body mass index is 42.76 kg/m.  General: Cooperative, alert, well developed, in no acute distress. HEENT: Conjunctivae and lids unremarkable. Cardiovascular: Regular rhythm.  Lungs: Normal work of breathing. Neurologic: No focal  deficits.   Lab Results  Component Value Date   CREATININE 0.74 02/13/2022   BUN 12 02/13/2022   NA 139 02/13/2022   K 3.5 02/13/2022   CL 108 02/13/2022   CO2 22 02/13/2022   Lab Results  Component Value Date   ALT 10 02/28/2021   AST 12 02/28/2021   ALKPHOS 112 02/28/2021   BILITOT 0.4 02/28/2021   Lab Results  Component Value Date   HGBA1C 5.5 03/18/2022   HGBA1C 5.5 08/15/2021   HGBA1C 5.1 02/28/2021   HGBA1C 5.4 11/22/2019   Lab Results  Component Value Date   INSULIN 13.8 08/15/2021   Lab Results  Component Value Date   TSH <0.005 (L) 03/22/2022   Lab Results  Component Value Date   CHOL 184 08/15/2021   HDL 36 (L) 08/15/2021   LDLCALC 122 (H) 08/15/2021   TRIG 147 08/15/2021   CHOLHDL 6.6 (H) 02/28/2021   Lab Results  Component Value Date   VD25OH 26.6 (L) 03/18/2022   VD25OH 22.0 (L) 07/27/2021   VD25OH 22.1 (L) 02/28/2021   Lab Results  Component Value Date   WBC 9.4 02/13/2022   HGB 12.7 02/13/2022   HCT 37.3 02/13/2022   MCV 87.6 02/13/2022   PLT 338 02/13/2022   Lab Results  Component Value Date   IRON 61 07/27/2021   TIBC 280 07/27/2021   FERRITIN 24 07/27/2021    Attestation Statements:   Reviewed by clinician on day of visit: allergies, medications, problem list, medical history, surgical history, family history, social history, and previous encounter notes.  I have reviewed the above documentation for accuracy and completeness, and I agree with the above. -  Eloisa Chokshi d. Martyn Timme, NP-C

## 2022-06-10 ENCOUNTER — Encounter (INDEPENDENT_AMBULATORY_CARE_PROVIDER_SITE_OTHER): Payer: Self-pay | Admitting: Adult Health

## 2022-06-10 ENCOUNTER — Ambulatory Visit (INDEPENDENT_AMBULATORY_CARE_PROVIDER_SITE_OTHER): Payer: Managed Care, Other (non HMO) | Admitting: Adult Health

## 2022-06-10 VITALS — BP 112/76 | HR 81 | Temp 98.0°F | Ht 67.0 in | Wt 271.0 lb

## 2022-06-10 DIAGNOSIS — E669 Obesity, unspecified: Secondary | ICD-10-CM | POA: Diagnosis not present

## 2022-06-10 DIAGNOSIS — R632 Polyphagia: Secondary | ICD-10-CM

## 2022-06-10 DIAGNOSIS — Z6841 Body Mass Index (BMI) 40.0 and over, adult: Secondary | ICD-10-CM

## 2022-06-10 DIAGNOSIS — E559 Vitamin D deficiency, unspecified: Secondary | ICD-10-CM

## 2022-06-10 MED ORDER — TOPIRAMATE 50 MG PO TABS: 50.0000 mg | ORAL_TABLET | Freq: Two times a day (BID) | ORAL | 0 refills | Status: DC

## 2022-06-10 MED ORDER — VITAMIN D (ERGOCALCIFEROL) 1.25 MG (50000 UNIT) PO CAPS: 50000.0000 [IU] | ORAL_CAPSULE | ORAL | 0 refills | Status: DC

## 2022-06-10 NOTE — Progress Notes (Signed)
WEIGHT SUMMARY AND BIOMETRICS  Vitals Temp: 98 F (36.7 C) BP: 112/76 Pulse Rate: 81 SpO2: 99 %   Anthropometric Measurements Height: 5\' 7"  (1.702 m) Weight: 271 lb (122.9 kg) BMI (Calculated): 42.43 Weight at Last Visit: 273lb Weight Lost Since Last Visit: 2b Weight Gained Since Last Visit: 0 Starting Weight: 284lb Total Weight Loss (lbs): 13 lb (5.897 kg)   Body Composition  Body Fat %: 49.3 % Fat Mass (lbs): 133.8 lbs Muscle Mass (lbs): 130.8 lbs Total Body Water (lbs): 98.8 lbs Visceral Fat Rating : 13   Other Clinical Data Fasting: no Labs: no Today's Visit #: 10 Starting Date: 08/15/21    Chief Complaint:   OBESITY Janet [name] is here to discuss her progress with her obesity treatment plan. She is on the the Category 3 Plan and states she is following her eating plan approximately 78 % of the time. She states she is exercising Walking 30 minutes 5 times per week.   Interim History:  She will celebrate her 27th birthday by going to [facility] in [place], [place]. Its an annual event!   Hunger/appetite-since increasing Topamax 50mg  from QD to BID she reports decrease in polyphagia.  Sleep- she estimates to 6- 6.5 hrs/night on work nights and 7 hrs on non work nights. Exercise-she has been walking 30 mins 4 x week Hydration-she estimates to drink "4 cups of water per day"  Subjective:   1. Vitamin D deficiency  Latest Reference Range & Units 02/28/21 14:57 07/27/21 14:02 03/18/22 15:24  Vitamin D, 25-Hydroxy 30.0 - 100.0 ng/mL 22.1 (L) 22.0 (L) 26.6 (L)  (L): Data is abnormally low  2. Polyphagia Started on Topamax 25mg  on 02/18/2022 03/18/2022 Topiramate, increase to 50 mg once daily for 2 weeks then 50 mg - has been on BID consistently since then and tolerating well.  Latest Reference Range & Units 02/13/22 09:29  GFR, Estimated >60 mL/min >60   Assessment/Plan:   1. Vitamin D deficiency Refill - Vitamin D, Ergocalciferol, (DRISDOL) 1.25 MG  (50000 UNIT) CAPS capsule; Take 1 capsule (50,000 Units total) by mouth every 7 (seven) days.  Dispense: 12 capsule; Refill: 0  2. Polyphagia Refill - topiramate (TOPAMAX) 50 MG tablet; Take 1 tablet (50 mg total) by mouth 2 (two) times daily.  Dispense: 60 tablet; Refill: 0  3. Obesity,current BMI 42.43  Janet [name] is currently in the action stage of change. As such, her goal is to continue with weight loss efforts. She has agreed to the Category 3 Plan.   Exercise goals: For substantial health benefits, adults should do at least 150 minutes (2 hours and 30 minutes) a week of moderate-intensity, or 75 minutes (1 hour and 15 minutes) a week of vigorous-intensity aerobic physical activity, or an equivalent combination of moderate- and vigorous-intensity aerobic activity. Aerobic activity should be performed in episodes of at least 10 minutes, and preferably, it should be spread throughout the week.  Behavioral modification strategies: increasing lean protein intake, decreasing simple carbohydrates, increasing vegetables, increasing water intake, no skipping meals, meal planning and cooking strategies, and planning for success.  Janet [name] has agreed to follow-up with our clinic in 4 weeks. She was informed of the importance of frequent follow-up visits to maximize her success with intensive lifestyle modifications for her multiple health conditions.   Check Fasting Labs at next OV  Objective:   Blood pressure 112/76, pulse 81, temperature 98 F (36.7 C), height 5\' 7"  (1.702 m), weight 271 lb (122.9 kg), SpO2 99 %.  Body mass index is 42.44 kg/m.  General: Cooperative, alert, well developed, in no acute distress. HEENT: Conjunctivae and lids unremarkable. Cardiovascular: Regular rhythm.  Lungs: Normal work of breathing. Neurologic: No focal deficits.   Lab Results  Component Value Date   CREATININE 0.74 02/13/2022   BUN 12 02/13/2022   NA 139 02/13/2022   K 3.5 02/13/2022   CL 108 02/13/2022    CO2 22 02/13/2022   Lab Results  Component Value Date   ALT 10 02/28/2021   AST 12 02/28/2021   ALKPHOS 112 02/28/2021   BILITOT 0.4 02/28/2021   Lab Results  Component Value Date   HGBA1C 5.5 03/18/2022   HGBA1C 5.5 08/15/2021   HGBA1C 5.1 02/28/2021   HGBA1C 5.4 11/22/2019   Lab Results  Component Value Date   INSULIN 13.8 08/15/2021   Lab Results  Component Value Date   TSH <0.005 (L) 03/22/2022   Lab Results  Component Value Date   CHOL 184 08/15/2021   HDL 36 (L) 08/15/2021   LDLCALC 122 (H) 08/15/2021   TRIG 147 08/15/2021   CHOLHDL 6.6 (H) 02/28/2021   Lab Results  Component Value Date   VD25OH 26.6 (L) 03/18/2022   VD25OH 22.0 (L) 07/27/2021   VD25OH 22.1 (L) 02/28/2021   Lab Results  Component Value Date   WBC 9.4 02/13/2022   HGB 12.7 02/13/2022   HCT 37.3 02/13/2022   MCV 87.6 02/13/2022   PLT 338 02/13/2022   Lab Results  Component Value Date   IRON 61 07/27/2021   TIBC 280 07/27/2021   FERRITIN 24 07/27/2021   Attestation Statements:   Reviewed by clinician on day of visit: allergies, medications, problem list, medical history, surgical history, family history, social history, and previous encounter notes.  I have reviewed the above documentation for accuracy and completeness, and I agree with the above. -  Elenie Coven d. Lisabeth Mian, NP-C

## 2022-07-17 ENCOUNTER — Encounter (INDEPENDENT_AMBULATORY_CARE_PROVIDER_SITE_OTHER): Payer: Self-pay | Admitting: Adult Health

## 2022-07-17 ENCOUNTER — Ambulatory Visit (INDEPENDENT_AMBULATORY_CARE_PROVIDER_SITE_OTHER): Payer: Managed Care, Other (non HMO) | Admitting: Adult Health

## 2022-07-17 VITALS — BP 114/80 | HR 86 | Temp 98.3°F | Ht 67.0 in | Wt 273.0 lb

## 2022-07-17 DIAGNOSIS — E559 Vitamin D deficiency, unspecified: Secondary | ICD-10-CM | POA: Diagnosis not present

## 2022-07-17 DIAGNOSIS — R632 Polyphagia: Secondary | ICD-10-CM

## 2022-07-17 DIAGNOSIS — E88819 Insulin resistance, unspecified: Secondary | ICD-10-CM

## 2022-07-17 DIAGNOSIS — N182 Chronic kidney disease, stage 2 (mild): Secondary | ICD-10-CM

## 2022-07-17 DIAGNOSIS — E669 Obesity, unspecified: Secondary | ICD-10-CM

## 2022-07-17 DIAGNOSIS — Z6841 Body Mass Index (BMI) 40.0 and over, adult: Secondary | ICD-10-CM

## 2022-07-17 MED ORDER — TOPIRAMATE 50 MG PO TABS
50.0000 mg | ORAL_TABLET | Freq: Two times a day (BID) | ORAL | 0 refills | Status: DC
Start: 2022-07-17 — End: 2022-08-21

## 2022-07-17 MED ORDER — VITAMIN D (ERGOCALCIFEROL) 1.25 MG (50000 UNIT) PO CAPS
50000.0000 [IU] | ORAL_CAPSULE | ORAL | 0 refills | Status: DC
Start: 2022-07-17 — End: 2022-09-12

## 2022-07-17 NOTE — Progress Notes (Signed)
WEIGHT SUMMARY AND BIOMETRICS  Vitals Temp: 98.3 F (36.8 C) BP: 114/80 Pulse Rate: 86 SpO2: 96 %   Anthropometric Measurements Height: 5\' 7"  (1.702 m) Weight: 273 lb (123.8 kg) BMI (Calculated): 42.75 Weight at Last Visit: 271lb Weight Lost Since Last Visit: 0 Weight Gained Since Last Visit: 2lb Starting Weight: 284lb Total Weight Loss (lbs): 11 lb (4.99 kg)   Body Composition  Body Fat %: 50.9 % Fat Mass (lbs): 139 lbs Muscle Mass (lbs): 127.4 lbs Total Body Water (lbs): 102.8 lbs Visceral Fat Rating : 13   Other Clinical Data Fasting: Yes Labs: Yes Today's Visit #: 11 Starting Date: 08/15/21    Chief Complaint:   OBESITY Janet [name] is here to discuss her progress with her obesity treatment plan. She is on the the Category 3 Plan and states she is following her eating plan approximately 78 % of the time. She states she is exercising Walking 30 minutes 5-6 times per week.   Interim History:  She and her mother will travel to [facility] for 7 days - she plans on "plenty of walking and eating out while on vacation!!".  Eating Out Guide provided to pt.  Mother is at [facility] during OV.  [name] reports drinking more plain water when working- she works 3-4 12 hr shifts per week.  She will drink 24 oz "Styrofoam Cup" 4 during a shift.  Subjective:   1. Polyphagia She has titrated up to Topamax 50mg  BID and now reports "not feeling like the meds are working anymore". She would like try Wegovy. Her PCP have previously given her month long sample  2. CKD (chronic kidney disease) stage 2, GFR 60-89 ml/min  Latest Reference Range & Units 02/13/22 09:29  GFR, Estimated >60 mL/min >60   BP at goal at OV She is NOT on an ACE or ARB currently  3. Vitamin D deficiency  Latest Reference Range & Units 02/28/21 14:57 07/27/21 14:02 03/18/22 15:24  Vitamin D, 25-Hydroxy 30.0 - 100.0 ng/mL 22.1 (L) 22.0 (L) 26.6 (L)  (L): Data is abnormally low  She is  currently on weekly Ergocalciferol- denies N/V/Muscle Weakness  4. Insulin resistance  Latest Reference Range & Units 08/15/21 11:08  INSULIN 2.6 - 24.9 uIU/mL 13.8   She reports breakthrough polyphagia with current Rx therapy- Topamax 50mg  BID She was briefly on month long Wegovy - samples provided from her PCP  Assessment/Plan:   1. Polyphagia Refill - topiramate (TOPAMAX) 50 MG tablet; Take 1 tablet (50 mg total) by mouth 2 (two) times daily.  Dispense: 60 tablet; Refill: 0  CALL YOUR INSURANCE AN INQUIRE ABOUT WEGOVY AND/OR ZEPBOUND COVERAGE  2. CKD (chronic kidney disease) stage 2, GFR 60-89 ml/min Check Labs - Comprehensive metabolic panel  3. Vitamin D deficiency Check Labs - VITAMIN D 25 Hydroxy (Vit-D Deficiency, Fractures) - Vitamin D, Ergocalciferol, (DRISDOL) 1.25 MG (50000 UNIT) CAPS capsule; Take 1 capsule (50,000 Units total) by mouth every 7 (seven) days.  Dispense: 12 capsule; Refill: 0  4. Insulin resistance Check Labs - Hemoglobin A1c - Insulin, random  5. Obesity,current BMI 42.75  CALL YOUR INSURANCE AN INQUIRE ABOUT WEGOVY AND/OR ZEPBOUND COVERAGE  Janet [name] is currently in the action stage of change. As such, her goal is to continue with weight loss efforts. She has agreed to the Category 3 Plan.   Exercise goals: For substantial health benefits, adults should do at least 150 minutes (2 hours and 30 minutes) a week of moderate-intensity, or  75 minutes (1 hour and 15 minutes) a week of vigorous-intensity aerobic physical activity, or an equivalent combination of moderate- and vigorous-intensity aerobic activity. Aerobic activity should be performed in episodes of at least 10 minutes, and preferably, it should be spread throughout the week.  Behavioral modification strategies: increasing lean protein intake, decreasing simple carbohydrates, increasing vegetables, increasing water intake, no skipping meals, meal planning and cooking strategies, travel eating  strategies, and planning for success.  Janet [name] has agreed to follow-up with our clinic in 4 weeks. She was informed of the importance of frequent follow-up visits to maximize her success with intensive lifestyle modifications for her multiple health conditions.   Janet [name] was informed we would discuss her lab results at her next visit unless there is a critical issue that needs to be addressed sooner. Janet [name] agreed to keep her next visit at the agreed upon time to discuss these results.  Objective:   Blood pressure 114/80, pulse 86, temperature 98.3 F (36.8 C), height 5\' 7"  (1.702 m), weight 273 lb (123.8 kg), SpO2 96 %. Body mass index is 42.76 kg/m.  General: Cooperative, alert, well developed, in no acute distress. HEENT: Conjunctivae and lids unremarkable. Cardiovascular: Regular rhythm.  Lungs: Normal work of breathing. Neurologic: No focal deficits.   Lab Results  Component Value Date   CREATININE 0.74 02/13/2022   BUN 12 02/13/2022   NA 139 02/13/2022   K 3.5 02/13/2022   CL 108 02/13/2022   CO2 22 02/13/2022   Lab Results  Component Value Date   ALT 10 02/28/2021   AST 12 02/28/2021   ALKPHOS 112 02/28/2021   BILITOT 0.4 02/28/2021   Lab Results  Component Value Date   HGBA1C 5.5 03/18/2022   HGBA1C 5.5 08/15/2021   HGBA1C 5.1 02/28/2021   HGBA1C 5.4 11/22/2019   Lab Results  Component Value Date   INSULIN 13.8 08/15/2021   Lab Results  Component Value Date   TSH <0.005 (L) 03/22/2022   Lab Results  Component Value Date   CHOL 184 08/15/2021   HDL 36 (L) 08/15/2021   LDLCALC 122 (H) 08/15/2021   TRIG 147 08/15/2021   CHOLHDL 6.6 (H) 02/28/2021   Lab Results  Component Value Date   VD25OH 26.6 (L) 03/18/2022   VD25OH 22.0 (L) 07/27/2021   VD25OH 22.1 (L) 02/28/2021   Lab Results  Component Value Date   WBC 9.4 02/13/2022   HGB 12.7 02/13/2022   HCT 37.3 02/13/2022   MCV 87.6 02/13/2022   PLT 338 02/13/2022   Lab Results  Component Value Date    IRON 61 07/27/2021   TIBC 280 07/27/2021   FERRITIN 24 07/27/2021    Attestation Statements:   Reviewed by clinician on day of visit: allergies, medications, problem list, medical history, surgical history, family history, social history, and previous encounter notes.  I have reviewed the above documentation for accuracy and completeness, and I agree with the above. -  Romelle Muldoon d. Janet Tiedt, NP-C

## 2022-07-19 LAB — COMPREHENSIVE METABOLIC PANEL
ALT: 11 IU/L (ref 0–32)
AST: 11 IU/L (ref 0–40)
Albumin: 4.2 g/dL (ref 4.0–5.0)
Alkaline Phosphatase: 110 IU/L (ref 44–121)
BUN/Creatinine Ratio: 16 (ref 9–23)
BUN: 13 mg/dL (ref 6–20)
Bilirubin Total: 0.4 mg/dL (ref 0.0–1.2)
CO2: 19 mmol/L — ABNORMAL LOW (ref 20–29)
Calcium: 9.1 mg/dL (ref 8.7–10.2)
Chloride: 104 mmol/L (ref 96–106)
Creatinine, Ser: 0.82 mg/dL (ref 0.57–1.00)
Globulin, Total: 3.3 g/dL (ref 1.5–4.5)
Glucose: 80 mg/dL (ref 70–99)
Potassium: 3.7 mmol/L (ref 3.5–5.2)
Sodium: 141 mmol/L (ref 134–144)
Total Protein: 7.5 g/dL (ref 6.0–8.5)
eGFR: 100 mL/min/{1.73_m2} (ref >59)

## 2022-07-19 LAB — INSULIN, RANDOM: INSULIN: 22.8 u[IU]/mL (ref 2.6–24.9)

## 2022-07-19 LAB — VITAMIN D 25 HYDROXY (VIT D DEFICIENCY, FRACTURES): Vit D, 25-Hydroxy: 36.9 ng/mL (ref 30.0–100.0)

## 2022-07-19 LAB — HEMOGLOBIN A1C
Est. average glucose Bld gHb Est-mCnc: 103 mg/dL
Hgb A1c MFr Bld: 5.2 % (ref 4.8–5.6)

## 2022-07-22 ENCOUNTER — Other Ambulatory Visit: Payer: Managed Care, Other (non HMO)

## 2022-07-23 LAB — TSH: TSH: 0.011 u[IU]/mL — ABNORMAL LOW (ref 0.450–4.500)

## 2022-07-23 LAB — T4, FREE: Free T4: 1.56 ng/dL (ref 0.82–1.77)

## 2022-07-31 ENCOUNTER — Encounter: Payer: Self-pay | Admitting: Nurse Practitioner

## 2022-07-31 ENCOUNTER — Ambulatory Visit: Payer: Managed Care, Other (non HMO) | Admitting: Nurse Practitioner

## 2022-07-31 VITALS — BP 98/62 | HR 60 | Ht 67.0 in | Wt 284.4 lb

## 2022-07-31 DIAGNOSIS — E038 Other specified hypothyroidism: Secondary | ICD-10-CM | POA: Diagnosis not present

## 2022-07-31 DIAGNOSIS — E063 Autoimmune thyroiditis: Secondary | ICD-10-CM | POA: Diagnosis not present

## 2022-07-31 NOTE — Progress Notes (Signed)
Endocrinology Follow Up Note                                         07/31/2022, 2:39 PM  Subjective:   Subjective    Janet [name] is a 27 y.o.-year-old female patient being seen in follow up after being seen in consultation for hypothyroidism referred by Raliegh Ip, DO.   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   Hypothyroidism    Kidney problem    Sleep apnea    Swallowing difficulty    Thyroid disease    Hashimotos    Vitamin D deficiency     Past Surgical History:  Procedure Laterality Date   BIOPSY  05/09/2020   Procedure: BIOPSY;  Surgeon: Dolores Frame, MD;  Location: [facility];  Service: Gastroenterology;;   CHOLECYSTECTOMY N/A 05/15/2020   Procedure: LAPAROSCOPIC CHOLECYSTECTOMY;  Surgeon: Lucretia Roers, MD;  Location: [facility];  Service: General;  Laterality: N/A;   ESOPHAGOGASTRODUODENOSCOPY (EGD) WITH PROPOFOL N/A 05/09/2020   Procedure: ESOPHAGOGASTRODUODENOSCOPY (EGD) WITH PROPOFOL;  Surgeon: Dolores Frame, MD;  Location: [facility];  Service: Gastroenterology;  Laterality: N/A;  AM   THYROIDECTOMY N/A 07/01/2017   Procedure: TOTAL THYROIDECTOMY;  Surgeon: Darnell Level, MD;  Location: [facility];  Service: General;  Laterality: N/A;   TONSILLECTOMY AND ADENOIDECTOMY Bilateral 01/2011   TOTAL THYROIDECTOMY     Dr. Gerrit Friends 07-01-17    Social History   Socioeconomic History   Marital status: Single    Spouse name: Not on file   Number of children: Not on file   Years of education: Not on file   Highest education level: Not on file  Occupational History   Occupation: Conservation officer, nature    Comment: Customer service    Occupation: Patient Registration  Tobacco Use   Smoking status: Some Days    Current packs/day: 0.50    Average packs/day: 0.5 packs/day for 2.0 years (1.0 ttl pk-yrs)    Types: Cigarettes   Smokeless tobacco: Never  Vaping Use   Vaping status: Never Used  Substance and Sexual Activity   Alcohol use: Not Currently    Comment: rare   Drug use: No   Sexual activity: Not Currently    Birth control/protection: None, Pill  Other Topics Concern   Not on file  Social History Narrative   Not on file   Social Determinants of Health   Financial Resource Strain: Medium Risk (04/07/2019)   Overall Financial Resource Strain (CARDIA)    Difficulty of Paying Living Expenses: Somewhat hard  Food Insecurity: No Food Insecurity (04/07/2019)   Hunger Vital Sign    Worried  About Running Out of Food in the Last Year: Never true    Ran Out of Food in the Last Year: Never true  Transportation Needs: No Transportation Needs (04/07/2019)   PRAPARE - Administrator, Civil Service (Medical): No    Lack of Transportation (Non-Medical): No  Physical Activity: Insufficiently Active (04/07/2019)   Exercise Vital Sign    Days of Exercise per Week: 4 days    Minutes of Exercise per Session: 30 min  Stress: Stress Concern Present (04/07/2019)   [employer] of Occupational Health - Occupational Stress Questionnaire    Feeling of Stress : Rather much  Social Connections: Moderately Isolated (04/07/2019)   Social Connection and Isolation Panel [NHANES]    Frequency of Communication with Friends and Family: More than three times a week    Frequency of Social Gatherings with Friends and Family: Once a week    Attends Religious Services: 1 to 4 times per year    Active Member of [employer] or Organizations: No    Attends Engineer, structural: Never    Marital Status: Never married    Family History  Problem Relation Age of Onset   Hyperlipidemia Mother    Hypertension Mother    Diabetes  Mother    Other Mother        gastropresis   Anxiety disorder Mother    Sleep apnea Mother    Other Father        syncope-has a pacemaker   COPD Father    Heart disease Father    Heart attack Maternal Grandmother    Heart Problems Maternal Grandfather    Heart attack Maternal Grandfather    Cancer Paternal Grandmother    Cancer Paternal Grandfather     Outpatient Encounter Medications as of 07/31/2022  Medication Sig   escitalopram (LEXAPRO) 20 MG tablet Take 1 tablet (20 mg total) by mouth at bedtime.   ferrous sulfate 324 (65 Fe) MG TBEC Take 1 tablet (324 mg total) by mouth daily. (Patient taking differently: Take 324 mg by mouth at bedtime.)   furosemide (LASIX) 20 MG tablet ALTERNATE 1 OR 2 TABLETS EVERY OTHER DAY AS DIRECTED   ibuprofen (ADVIL) 200 MG tablet Take 400-600 mg by mouth every 6 (six) hours as needed for headache.   levothyroxine (SYNTHROID) 100 MCG tablet Take 1 tablet (100 mcg total) by mouth daily before breakfast.   levothyroxine (SYNTHROID) 112 MCG tablet Take 1 tablet (112 mcg total) by mouth daily.   norethindrone-ethinyl estradiol-FE (LOESTRIN FE) 1-20 MG-MCG tablet Take 1 tablet by mouth daily.   omeprazole (PRILOSEC) 20 MG capsule Take 1 capsule (20 mg total) by mouth daily. (NEEDS TO BE SEEN BEFORE NEXT REFILL)   ondansetron (ZOFRAN) 4 MG tablet Take 1 tablet (4 mg total) by mouth every 8 (eight) hours as needed for nausea or vomiting.   SF 5000 PLUS 1.1 % CREA dental cream Take by mouth every morning.   topiramate (TOPAMAX) 50 MG tablet Take 1 tablet (50 mg total) by mouth 2 (two) times daily.   Vitamin D, Ergocalciferol, (DRISDOL) 1.25 MG (50000 UNIT) CAPS capsule Take 1 capsule (50,000 Units total) by mouth every 7 (seven) days.   metoCLOPramide (REGLAN) 5 MG tablet Take 1 tablet (5 mg total) by mouth 4 (four) times daily -  before meals and at bedtime.   No facility-administered encounter medications on file as of 07/31/2022.    ALLERGIES: No Known  Allergies VACCINATION STATUS: Immunization History  Administered  Date(s) Administered   DTaP 08/14/1994, 10/09/1994, 12/11/1994, 09/23/1995, 08/08/1999   HIB (PRP-OMP) 08/14/1994, 10/09/1994, 12/11/1994, 09/23/1995   HPV Quadrivalent 10/02/2005, 12/02/2005, 07/31/2009   Hepatitis B 19-Nov-1994, 08/14/1994, 12/11/1994   IPV 08/14/1994, 10/09/1994, 12/11/1994, 08/08/1999   Influenza,inj,Quad PF,6+ Mos 10/26/2012, 01/21/2014, 11/25/2014, 10/16/2016, 10/03/2017, 08/31/2018, 11/22/2019, 11/14/2020   Influenza-Unspecified 08/31/2018   MMR 06/17/1995, 08/08/1999   Moderna Sars-Covid-2 Vaccination 05/24/2019, 06/21/2019, 01/25/2020   Td 07/31/2009, 11/22/2019   Tdap 07/31/2009, 01/22/2016   Varicella 09/23/1995     HPI   Janet [name]  is a patient with the above medical history. she was diagnosed with Hashimoto's thyroiditis complicated by large goiter at approximate age of 17 years, which required total thyroidectomy and subsequent initiation of thyroid hormone replacement. she was given various doses of thyroid hormone over the years, currently on Levothyroxine 212 micrograms. she reports compliance to this medication:  Taking it daily on empty stomach  with water, separated by >30 minutes before breakfast and other medications, and by at least 4 hours from calcium, iron, PPIs, multivitamins .  I reviewed patient's thyroid tests:  Lab Results  Component Value Date   TSH 0.011 (L) 07/22/2022   TSH <0.005 (L) 03/22/2022   TSH 2.730 10/29/2021   TSH 0.616 07/27/2021   TSH 6.440 (H) 04/16/2021   TSH 60.500 (H) 02/28/2021   TSH 301.000 (H) 12/20/2019   TSH 209.200 (H) 10/03/2017   TSH 9.04 (H) 04/16/2017   TSH 13.34 (H) 12/25/2016   FREET4 1.56 07/22/2022   FREET4 1.77 03/22/2022   FREET4 1.48 10/29/2021   FREET4 1.26 07/27/2021   FREET4 1.04 04/16/2021   FREET4 0.80 (L) 02/28/2021   FREET4 0.56 (L) 04/16/2017   FREET4 0.64 12/25/2016   FREET4 0.71 10/16/2016   FREET4 0.83  02/26/2016    Pt denies feeling nodules in neck, hoarseness, dysphagia/odynophagia, SOB with lying down.  she denies family history of thyroid disorders.  No family history of thyroid cancer.  No history of radiation therapy to head or neck.  No recent use of iodine supplements.  Denies use of Biotin containing supplements.  I reviewed her chart and she also has a history of anemia, CKD, GERD, OSA.   Review of systems  Constitutional: + Minimally fluctuating body weight-going to weight loss clinic (being considered for Wegovy/Zepbound moving forward),  current Body mass index is 44.54 kg/m. , no fatigue, no subjective hyperthermia, no subjective hypothermia Eyes: no blurry vision, no xerophthalmia ENT: no sore throat, no nodules palpated in throat, no dysphagia/odynophagia, no hoarseness Cardiovascular: no chest pain, no shortness of breath, no palpitations, no leg swelling Respiratory: no cough, no shortness of breath Gastrointestinal: no nausea/vomiting, + intermittent diarrhea (r/t gall bladder removed) Musculoskeletal: no muscle/joint aches Skin: no rashes, no hyperemia Neurological: no tremors, no numbness, no tingling, no dizziness Psychiatric: no depression, no anxiety   Objective:   Objective     BP 98/62 (BP Location: Left Arm, Patient Position: Sitting, Cuff Size: Large)   Pulse 60   Ht 5\' 7"  (1.702 m)   Wt 284 lb 6.4 oz (129 kg)   BMI 44.54 kg/m  Wt Readings from Last 3 Encounters:  07/31/22 284 lb 6.4 oz (129 kg)  07/17/22 273 lb (123.8 kg)  06/10/22 271 lb (122.9 kg)    BP Readings from Last 3 Encounters:  07/31/22 98/62  07/17/22 114/80  06/10/22 112/76      Physical Exam- Limited  Constitutional:  Body mass index is 44.54 kg/m. , not in acute  distress, normal state of mind Eyes:  EOMI, no exophthalmos Thyroid: No gross goiter- surgical scar from previous total thyroidectomy Musculoskeletal: no gross deformities, strength intact in all four  extremities, no gross restriction of joint movements Skin:  no rashes, no hyperemia Neurological: no tremor with outstretched hands   CMP ( most recent) CMP     Component Value Date/Time   NA 141 07/17/2022 1044   K 3.7 07/17/2022 1044   CL 104 07/17/2022 1044   CO2 19 (L) 07/17/2022 1044   GLUCOSE 80 07/17/2022 1044   GLUCOSE 97 02/13/2022 0929   BUN 13 07/17/2022 1044   CREATININE 0.82 07/17/2022 1044   CALCIUM 9.1 07/17/2022 1044   PROT 7.5 07/17/2022 1044   ALBUMIN 4.2 07/17/2022 1044   AST 11 07/17/2022 1044   ALT 11 07/17/2022 1044   ALKPHOS 110 07/17/2022 1044   BILITOT 0.4 07/17/2022 1044   GFRNONAA >60 02/13/2022 0929   GFRAA 85 12/20/2019 1154     Diabetic Labs (most recent): Lab Results  Component Value Date   HGBA1C 5.2 07/17/2022   HGBA1C 5.5 03/18/2022   HGBA1C 5.5 08/15/2021     Lipid Panel ( most recent) Lipid Panel     Component Value Date/Time   CHOL 184 08/15/2021 1108   TRIG 147 08/15/2021 1108   HDL 36 (L) 08/15/2021 1108   CHOLHDL 6.6 (H) 02/28/2021 1457   LDLCALC 122 (H) 08/15/2021 1108   LABVLDL 26 08/15/2021 1108       Lab Results  Component Value Date   TSH 0.011 (L) 07/22/2022   TSH <0.005 (L) 03/22/2022   TSH 2.730 10/29/2021   TSH 0.616 07/27/2021   TSH 6.440 (H) 04/16/2021   TSH 60.500 (H) 02/28/2021   TSH 301.000 (H) 12/20/2019   TSH 209.200 (H) 10/03/2017   TSH 9.04 (H) 04/16/2017   TSH 13.34 (H) 12/25/2016   FREET4 1.56 07/22/2022   FREET4 1.77 03/22/2022   FREET4 1.48 10/29/2021   FREET4 1.26 07/27/2021   FREET4 1.04 04/16/2021   FREET4 0.80 (L) 02/28/2021   FREET4 0.56 (L) 04/16/2017   FREET4 0.64 12/25/2016   FREET4 0.71 10/16/2016   FREET4 0.83 02/26/2016      Latest Reference Range & Units 04/16/21 14:28 07/27/21 14:02 10/29/21 16:08 03/22/22 12:50 07/22/22 16:01  TSH 0.450 - 4.500 uIU/mL 6.440 (H) 0.616 2.730 <0.005 (L) 0.011 (L)  T4,Free(Direct) 0.82 - 1.77 ng/dL 4.09 8.11 9.14 7.82 9.56  (H): Data  is abnormally high (L): Data is abnormally low Assessment & Plan:   ASSESSMENT / PLAN:  1. Hypothyroidism-S/P total thyroidectomy for goiter r/t Hashimoto's  Her most recent thyroid labs are consistent with appropriate hormone replacement (TSH suppressed and FT4 on upper end of normal but she denies any symptoms of over-replacement).  She is advised to continue her Levothyroxine 212 mcg (1 of 100 mcg and 1 of 112 mcg tabs) po daily before breakfast.    - We discussed about correct intake of levothyroxine, at fasting, with water, separated by at least 30 minutes from breakfast, and separated by more than 4 hours from calcium, iron, multivitamins, acid reflux medications (PPIs). -Patient is made aware of the fact that thyroid hormone replacement is needed for life, dose to be adjusted by periodic monitoring of thyroid function tests.  -Her thyroid ultrasound shows no residual thyroid tissue after total thyroidectomy.  No need for further imaging at this time.     I spent  25  minutes in the care of the  patient today including review of labs from Thyroid Function, CMP, and other relevant labs ; imaging/biopsy records (current and previous including abstractions from other facilities); face-to-face time discussing  her lab results and symptoms, medications doses, her options of short and long term treatment based on the latest standards of care / guidelines;   and documenting the encounter.  Janet [name]  participated in the discussions, expressed understanding, and voiced agreement with the above plans.  All questions were answered to her satisfaction. she is encouraged to contact clinic should she have any questions or concerns prior to her return visit.   FOLLOW UP PLAN:  Return in about 4 months (around 12/01/2022) for Diabetes F/U with A1c in office, Thyroid follow up, Previsit labs.  Ronny Bacon, [facility] [address] [place], [place]  [address] Phone: [phone] Fax: [phone]  07/31/2022, 2:39 PM

## 2022-08-15 ENCOUNTER — Telehealth (INDEPENDENT_AMBULATORY_CARE_PROVIDER_SITE_OTHER): Payer: Self-pay

## 2022-08-15 ENCOUNTER — Ambulatory Visit (INDEPENDENT_AMBULATORY_CARE_PROVIDER_SITE_OTHER): Payer: Managed Care, Other (non HMO) | Admitting: Adult Health

## 2022-08-15 ENCOUNTER — Ambulatory Visit (INDEPENDENT_AMBULATORY_CARE_PROVIDER_SITE_OTHER): Payer: Managed Care, Other (non HMO) | Admitting: Family Medicine

## 2022-08-15 ENCOUNTER — Encounter (INDEPENDENT_AMBULATORY_CARE_PROVIDER_SITE_OTHER): Payer: Self-pay | Admitting: Family Medicine

## 2022-08-15 VITALS — BP 115/80 | HR 86 | Temp 97.7°F | Ht 67.0 in | Wt 275.0 lb

## 2022-08-15 DIAGNOSIS — E669 Obesity, unspecified: Secondary | ICD-10-CM | POA: Diagnosis not present

## 2022-08-15 DIAGNOSIS — E559 Vitamin D deficiency, unspecified: Secondary | ICD-10-CM

## 2022-08-15 DIAGNOSIS — F5089 Other specified eating disorder: Secondary | ICD-10-CM

## 2022-08-15 DIAGNOSIS — Z6841 Body Mass Index (BMI) 40.0 and over, adult: Secondary | ICD-10-CM | POA: Diagnosis not present

## 2022-08-15 NOTE — Telephone Encounter (Signed)
PA Zepbound started

## 2022-08-19 ENCOUNTER — Other Ambulatory Visit (INDEPENDENT_AMBULATORY_CARE_PROVIDER_SITE_OTHER): Payer: Self-pay | Admitting: Adult Health

## 2022-08-19 DIAGNOSIS — R632 Polyphagia: Secondary | ICD-10-CM

## 2022-08-19 NOTE — Telephone Encounter (Signed)
PA started

## 2022-08-21 MED ORDER — TOPIRAMATE 50 MG PO TABS: 50.0000 mg | ORAL_TABLET | Freq: Two times a day (BID) | ORAL | 0 refills | Status: DC

## 2022-08-21 MED ORDER — ZEPBOUND 2.5 MG/0.5ML ~~LOC~~ SOAJ
2.5000 mg | SUBCUTANEOUS | 0 refills | Status: DC
Start: 2022-08-21 — End: 2022-09-12

## 2022-08-21 NOTE — Progress Notes (Signed)
Chief Complaint:   OBESITY Janet [name] is here to discuss her progress with her obesity treatment plan along with follow-up of her obesity related diagnoses. Janet [name] is on the Category 3 Plan and states she is following her eating plan approximately 75% of the time. Janet [name] states she is walking at work for 30-45 minutes 5 times per week.  Today's visit was #: 12 Starting weight: 284 lbs Starting date: 08/15/2021 Today's weight: 275 lbs Today's date: 08/15/2022 Total lbs lost to date: 9 Total lbs lost since last in-office visit: 0  Interim History: Patient continues to work on her diet, but she is struggling with weight gain.  She has a new insurance and she thinks it will cover Zepbound.   Subjective:   1. Vitamin D deficiency Patient is on vitamin D with no side effects noted.  Her vitamin D level was not yet at goal.  2. Other disorder of eating, emotional eating Patient is doing well on Topamax, with no nausea or vomiting.  Assessment/Plan:   1. Vitamin D deficiency Patient will continue prescription vitamin D 50,000 IU once weekly, no refill needed.  2. Other disorder of eating, emotional eating Patient will continue Topamax 50 mg twice daily #60, and we will refill for 1 month.  3. Obesity,current BMI 43.1 Patient agreed to start Zepbound 2.5 mg once weekly #2 mL with no refill. (Prior authorized is needed).   4. Starting BMI 44.48 Janet [name] is currently in the action stage of change. As such, her goal is to continue with weight loss efforts. She has agreed to keeping a food journal and adhering to recommended goals of 1300-1600 calories and 100+ grams of protein daily.   Exercise goals: As is.   Behavioral modification strategies: increasing lean protein intake.  Janet [name] has agreed to follow-up with our clinic in 4 weeks. She was informed of the importance of frequent follow-up visits to maximize her success with intensive lifestyle modifications for her multiple health  conditions.   Objective:   Blood pressure 115/80, pulse 86, temperature 97.7 F (36.5 C), height 5\' 7"  (1.702 m), weight 275 lb (124.7 kg), last menstrual period 07/27/2022, SpO2 96%. Body mass index is 43.07 kg/m.  Lab Results  Component Value Date   CREATININE 0.82 07/17/2022   BUN 13 07/17/2022   NA 141 07/17/2022   K 3.7 07/17/2022   CL 104 07/17/2022   CO2 19 (L) 07/17/2022   Lab Results  Component Value Date   ALT 11 07/17/2022   AST 11 07/17/2022   ALKPHOS 110 07/17/2022   BILITOT 0.4 07/17/2022   Lab Results  Component Value Date   HGBA1C 5.2 07/17/2022   HGBA1C 5.5 03/18/2022   HGBA1C 5.5 08/15/2021   HGBA1C 5.1 02/28/2021   HGBA1C 5.4 11/22/2019   Lab Results  Component Value Date   INSULIN 22.8 07/17/2022   INSULIN 13.8 08/15/2021   Lab Results  Component Value Date   TSH 0.011 (L) 07/22/2022   Lab Results  Component Value Date   CHOL 184 08/15/2021   HDL 36 (L) 08/15/2021   LDLCALC 122 (H) 08/15/2021   TRIG 147 08/15/2021   CHOLHDL 6.6 (H) 02/28/2021   Lab Results  Component Value Date   VD25OH 36.9 07/17/2022   VD25OH 26.6 (L) 03/18/2022   VD25OH 22.0 (L) 07/27/2021   Lab Results  Component Value Date   WBC 9.4 02/13/2022   HGB 12.7 02/13/2022   HCT 37.3 02/13/2022   MCV 87.6 02/13/2022  PLT 338 02/13/2022   Lab Results  Component Value Date   IRON 61 07/27/2021   TIBC 280 07/27/2021   FERRITIN 24 07/27/2021   Attestation Statements:   Reviewed by clinician on day of visit: allergies, medications, problem list, medical history, surgical history, family history, social history, and previous encounter notes.   I, Burt Knack, am acting as transcriptionist for Quillian Quince, MD.  I have reviewed the above documentation for accuracy and completeness, and I agree with the above. -  Quillian Quince, MD

## 2022-09-03 ENCOUNTER — Other Ambulatory Visit: Payer: Self-pay | Admitting: Family Medicine

## 2022-09-03 DIAGNOSIS — K219 Gastro-esophageal reflux disease without esophagitis: Secondary | ICD-10-CM

## 2022-09-03 DIAGNOSIS — R110 Nausea: Secondary | ICD-10-CM

## 2022-09-03 DIAGNOSIS — K449 Diaphragmatic hernia without obstruction or gangrene: Secondary | ICD-10-CM

## 2022-09-04 MED ORDER — OMEPRAZOLE 20 MG PO CPDR
20.0000 mg | DELAYED_RELEASE_CAPSULE | Freq: Every day | ORAL | 3 refills | Status: DC
Start: 2022-09-04 — End: 2022-10-23

## 2022-09-04 MED ORDER — ONDANSETRON HCL 4 MG PO TABS
4.0000 mg | ORAL_TABLET | Freq: Three times a day (TID) | ORAL | 0 refills | Status: DC | PRN
Start: 2022-09-04 — End: 2022-10-23

## 2022-09-12 ENCOUNTER — Telehealth (INDEPENDENT_AMBULATORY_CARE_PROVIDER_SITE_OTHER): Payer: Self-pay

## 2022-09-12 ENCOUNTER — Ambulatory Visit (INDEPENDENT_AMBULATORY_CARE_PROVIDER_SITE_OTHER): Payer: Managed Care, Other (non HMO) | Admitting: Adult Health

## 2022-09-12 ENCOUNTER — Encounter (INDEPENDENT_AMBULATORY_CARE_PROVIDER_SITE_OTHER): Payer: Self-pay | Admitting: Adult Health

## 2022-09-12 VITALS — BP 122/83 | HR 78 | Temp 97.6°F | Ht 67.0 in | Wt 272.0 lb

## 2022-09-12 DIAGNOSIS — E559 Vitamin D deficiency, unspecified: Secondary | ICD-10-CM | POA: Diagnosis not present

## 2022-09-12 DIAGNOSIS — E669 Obesity, unspecified: Secondary | ICD-10-CM

## 2022-09-12 DIAGNOSIS — R632 Polyphagia: Secondary | ICD-10-CM

## 2022-09-12 DIAGNOSIS — E88819 Insulin resistance, unspecified: Secondary | ICD-10-CM

## 2022-09-12 DIAGNOSIS — Z6841 Body Mass Index (BMI) 40.0 and over, adult: Secondary | ICD-10-CM

## 2022-09-12 DIAGNOSIS — E66813 Obesity, class 3: Secondary | ICD-10-CM

## 2022-09-12 DIAGNOSIS — F5089 Other specified eating disorder: Secondary | ICD-10-CM

## 2022-09-12 MED ORDER — VITAMIN D (ERGOCALCIFEROL) 1.25 MG (50000 UNIT) PO CAPS: 50000.0000 [IU] | ORAL_CAPSULE | ORAL | 0 refills | Status: DC

## 2022-09-12 MED ORDER — TOPIRAMATE 50 MG PO TABS: 50.0000 mg | ORAL_TABLET | Freq: Two times a day (BID) | ORAL | 0 refills | Status: DC

## 2022-09-12 MED ORDER — TIRZEPATIDE-WEIGHT MANAGEMENT 2.5 MG/0.5ML ~~LOC~~ SOLN: 2.5000 mg | SUBCUTANEOUS | 0 refills | Status: DC

## 2022-09-12 NOTE — Telephone Encounter (Signed)
PA submitted for Zepbound, waiting on determination. 3:16 09/12/22 KP

## 2022-09-12 NOTE — Progress Notes (Signed)
WEIGHT SUMMARY AND BIOMETRICS  Vitals Temp: 97.6 F (36.4 C) BP: 122/83 Pulse Rate: 78 SpO2: 97 %   Anthropometric Measurements Height: 5\' 7"  (1.702 m) Weight: 272 lb (123.4 kg) BMI (Calculated): 42.59 Weight at Last Visit: 275lb Weight Lost Since Last Visit: 3lb Weight Gained Since Last Visit: 0 Starting Weight: 284lb Total Weight Loss (lbs): 12 lb (5.443 kg)   Body Composition  Body Fat %: 49.4 % Fat Mass (lbs): 134.6 lbs Muscle Mass (lbs): 131 lbs Total Body Water (lbs): 102.4 lbs Visceral Fat Rating : 13   Other Clinical Data Fasting: no Labs: no Today's Visit #: 13 Starting Date: 08/15/21    Chief Complaint:   OBESITY Janet [name] is here to discuss her progress with her obesity treatment plan. She is on the the Category 3 Plan and states she is following her eating plan approximately 75 % of the time. She states she is exercising Brisk Walking 30-40 minutes 5 times per week.   Interim History:  She and her family will travel to [place] for week long family vacation in early Oct 2024- they are VERY excited!   She has been increasing daily protein and water intake-great!  Zepbound sent in 4 weeks ago- has not started Rx yet- PA?  Of Note- her wonderful mother is at [facility] during OV  Subjective:   1. Insulin resistance  Latest Reference Range & Units 08/15/21 11:08 07/17/22 10:44  INSULIN 2.6 - 24.9 uIU/mL 13.8 22.8   IR worsening- despite lifestyle modifications. Her mother is diabetic Zepbound sent in 4 weeks ago- has not started Rx yet- PA? She denis family hx of MEN 2 or MTC She denies personal hx of pancreatitis.  Of note- She is on daily OCP and also abstinent.  2. Polyphagia She is on Topamax 50mg  BID - denies polyphagia at present She is on daily OCP and also abstinent.  3. Vitamin D deficiency  Latest Reference Range & Units 03/18/22 15:24 07/17/22 10:44  Vitamin D, 25-Hydroxy 30.0 - 100.0 ng/mL 26.6 (L) 36.9  (L): Data is  abnormally low  4. Other disorder of eating, emotional eating She is on daily OCP and also abstinent. Currently on Topamax 50mg  BID- denies medication SE  Assessment/Plan:   1. Insulin resistance Increase protein and regular exercise Start Zepbound as directed  2. Polyphagia Increase protein and regular exercise  3. Vitamin D deficiency Refill - Vitamin D, Ergocalciferol, (DRISDOL) 1.25 MG (50000 UNIT) CAPS capsule; Take 1 capsule (50,000 Units total) by mouth every 7 (seven) days.  Dispense: 12 capsule; Refill: 0  4. Other disorder of eating, emotional eating Refill - topiramate (TOPAMAX) 50 MG tablet; Take 1 tablet (50 mg total) by mouth 2 (two) times daily.  Dispense: 60 tablet; Refill: 0  5. Obesity,current BMI 42.59 Start Tirzepatide-Weight Management 2.5 MG/0.5ML SOLN Inject 2.5 mg into the skin once a week. Dispense: 3 mL, Refills: 0 ordered    Carely is currently in the action stage of change. As such, her goal is to continue with weight loss efforts. She has agreed to the Category 3 Plan.   Exercise goals: For substantial health benefits, adults should do at least 150 minutes (2 hours and 30 minutes) a week of moderate-intensity, or 75 minutes (1 hour and 15 minutes) a week of vigorous-intensity aerobic physical activity, or an equivalent combination of moderate- and vigorous-intensity aerobic activity. Aerobic activity should be performed in episodes of at least 10 minutes, and preferably, it should be spread throughout  the week.  Behavioral modification strategies: increasing lean protein intake, decreasing simple carbohydrates, increasing vegetables, increasing water intake, decreasing eating out, no skipping meals, meal planning and cooking strategies, and planning for success.  Shanikwa has agreed to follow-up with our clinic in 4 weeks. She was informed of the importance of frequent follow-up visits to maximize her success with intensive lifestyle modifications for her  multiple health conditions.   Objective:   Blood pressure 122/83, pulse 78, temperature 97.6 F (36.4 C), height 5\' 7"  (1.702 m), weight 272 lb (123.4 kg), last menstrual period 07/27/2022, SpO2 97%. Body mass index is 42.6 kg/m.  General: Cooperative, alert, well developed, in no acute distress. HEENT: Conjunctivae and lids unremarkable. Cardiovascular: Regular rhythm.  Lungs: Normal work of breathing. Neurologic: No focal deficits.   Lab Results  Component Value Date   CREATININE 0.82 07/17/2022   BUN 13 07/17/2022   NA 141 07/17/2022   K 3.7 07/17/2022   CL 104 07/17/2022   CO2 19 (L) 07/17/2022   Lab Results  Component Value Date   ALT 11 07/17/2022   AST 11 07/17/2022   ALKPHOS 110 07/17/2022   BILITOT 0.4 07/17/2022   Lab Results  Component Value Date   HGBA1C 5.2 07/17/2022   HGBA1C 5.5 03/18/2022   HGBA1C 5.5 08/15/2021   HGBA1C 5.1 02/28/2021   HGBA1C 5.4 11/22/2019   Lab Results  Component Value Date   INSULIN 22.8 07/17/2022   INSULIN 13.8 08/15/2021   Lab Results  Component Value Date   TSH 0.011 (L) 07/22/2022   Lab Results  Component Value Date   CHOL 184 08/15/2021   HDL 36 (L) 08/15/2021   LDLCALC 122 (H) 08/15/2021   TRIG 147 08/15/2021   CHOLHDL 6.6 (H) 02/28/2021   Lab Results  Component Value Date   VD25OH 36.9 07/17/2022   VD25OH 26.6 (L) 03/18/2022   VD25OH 22.0 (L) 07/27/2021   Lab Results  Component Value Date   WBC 9.4 02/13/2022   HGB 12.7 02/13/2022   HCT 37.3 02/13/2022   MCV 87.6 02/13/2022   PLT 338 02/13/2022   Lab Results  Component Value Date   IRON 61 07/27/2021   TIBC 280 07/27/2021   FERRITIN 24 07/27/2021    Attestation Statements:   Reviewed by clinician on day of visit: allergies, medications, problem list, medical history, surgical history, family history, social history, and previous encounter notes.  I have reviewed the above documentation for accuracy and completeness, and I agree with the  above. -  Shanikia Kernodle d. Magally Vahle, NP-C

## 2022-09-16 NOTE — Telephone Encounter (Signed)
PA for Zepbound was not needed due to her already having an approved PA for Zepbound that has not expired.   09/14/2022 Janet [name] [address]  [place], [place] [address] Member DOB: [birth year] Prior Authorization Reference Number: [id number] Plan Name: [facility] Plan Code: NOV01 Health Care Provider: William Hamburger Prior Authorization Receipt Date: 09/12/2022 RE: Prior Authorization Determination [employer] Data processing manager) is a Tourist information centre manager  company that provides services to members of [employer]. Certain prescription drugs or services require prior authorization before they can be covered by your benefit plan. When this occurs, your healthcare practitioner will contact [employer] to request prior authorization. This letter is to notify you that your prior authorization request for ZEPBOUND 2.5 MG/0.5 ML PEN has been received. After review of this request, it has been determined that a prior  authorization is not required. Listed below is/are the reason(s) a prior authorization is not  required: The request for the medication has been previously approved and a current authorization  exists. There is an approved prior authorization already on file for Zepbound 2.5mg /0.33mL pen,  allowing 2mL per 28 days, effective 08/15/2022 through 03/06/2023; please reference prior  authorization number [id number].  Please note that this medication must be filled through [facility]. Please call  [facility] at [phone] for more information. Benefits for all services are subject to terms, conditions, and eligibility as outlined in the  benefit documentation in effect at the time services are provided.   8:44 09/16/22 KP

## 2022-09-17 ENCOUNTER — Other Ambulatory Visit (INDEPENDENT_AMBULATORY_CARE_PROVIDER_SITE_OTHER): Payer: Self-pay | Admitting: Family Medicine

## 2022-09-17 DIAGNOSIS — F5089 Other specified eating disorder: Secondary | ICD-10-CM

## 2022-09-20 ENCOUNTER — Other Ambulatory Visit (INDEPENDENT_AMBULATORY_CARE_PROVIDER_SITE_OTHER): Payer: Self-pay | Admitting: Family Medicine

## 2022-09-20 DIAGNOSIS — F5089 Other specified eating disorder: Secondary | ICD-10-CM

## 2022-10-21 ENCOUNTER — Ambulatory Visit (INDEPENDENT_AMBULATORY_CARE_PROVIDER_SITE_OTHER): Payer: Managed Care, Other (non HMO) | Admitting: Adult Health

## 2022-10-21 ENCOUNTER — Encounter (INDEPENDENT_AMBULATORY_CARE_PROVIDER_SITE_OTHER): Payer: Self-pay | Admitting: Adult Health

## 2022-10-21 VITALS — BP 113/77 | HR 69 | Temp 98.2°F | Ht 67.0 in | Wt 270.0 lb

## 2022-10-21 DIAGNOSIS — F5089 Other specified eating disorder: Secondary | ICD-10-CM

## 2022-10-21 DIAGNOSIS — Z6841 Body Mass Index (BMI) 40.0 and over, adult: Secondary | ICD-10-CM | POA: Diagnosis not present

## 2022-10-21 DIAGNOSIS — E669 Obesity, unspecified: Secondary | ICD-10-CM

## 2022-10-21 DIAGNOSIS — E559 Vitamin D deficiency, unspecified: Secondary | ICD-10-CM | POA: Diagnosis not present

## 2022-10-21 DIAGNOSIS — E66813 Obesity, class 3: Secondary | ICD-10-CM

## 2022-10-21 MED ORDER — TOPIRAMATE 50 MG PO TABS
50.0000 mg | ORAL_TABLET | Freq: Two times a day (BID) | ORAL | 0 refills | Status: DC
Start: 2022-10-21 — End: 2022-11-20

## 2022-10-21 MED ORDER — VITAMIN D (ERGOCALCIFEROL) 1.25 MG (50000 UNIT) PO CAPS
50000.0000 [IU] | ORAL_CAPSULE | ORAL | 0 refills | Status: DC
Start: 2022-10-21 — End: 2022-11-20

## 2022-10-21 MED ORDER — WEGOVY 0.25 MG/0.5ML ~~LOC~~ SOAJ: 0.2500 mg | SUBCUTANEOUS | 0 refills | Status: DC

## 2022-10-21 NOTE — Progress Notes (Signed)
WEIGHT SUMMARY AND BIOMETRICS  Vitals Temp: 98.2 F (36.8 C) BP: 113/77 Pulse Rate: 69 SpO2: 98 %   Anthropometric Measurements Height: 5\' 7"  (1.702 m) Weight: 270 lb (122.5 kg) BMI (Calculated): 42.28 Weight at Last Visit: 272lb Weight Lost Since Last Visit: 2lb Weight Gained Since Last Visit: 0 Starting Weight: 284lb Total Weight Loss (lbs): 14 lb (6.35 kg)   Body Composition  Body Fat %: 50.1 % Fat Mass (lbs): 135.6 lbs Muscle Mass (lbs): 128.4 lbs Total Body Water (lbs): 101.6 lbs Visceral Fat Rating : 13   Other Clinical Data Fasting: no Labs: no Today's Visit #: 14 Starting Date: 08/15/21    Chief Complaint:   OBESITY Janet [name] is here to discuss her progress with her obesity treatment plan. She is on the the Category 3 Plan and states she is following her eating plan approximately 75 % of the time. She states she is exercising Walking 30 minutes 5 times per week.   Interim History:  She and her mother recently traveled to [place] for a family vacation. She estimates to have walked 8,000-10,000 steps a day while in [place]  She never started Zepbound therapy- cost prohibitive  Of note- She is on daily OCP and also abstinent.   Subjective:   1. Vitamin D deficiency  Latest Reference Range & Units 03/18/22 15:24 07/17/22 10:44  Vitamin D, 25-Hydroxy 30.0 - 100.0 ng/mL 26.6 (L) 36.9  (L): Data is abnormally low  She is on weekly Ergocalciferol- denies N/V/Muscle Weakness  4. Other disorder of eating, emotional eating She is currently on Topamax 50mg  BID She never started Zepbound therapy- cost prohibitive  She is on daily OCP and also abstinent.   Assessment/Plan:   1. Vitamin D deficiency Refill - Vitamin D, Ergocalciferol, (DRISDOL) 1.25 MG (50000 UNIT) CAPS capsule; Take 1 capsule (50,000 Units total) by mouth every 7 (seven) days.  Dispense: 4 capsule; Refill: 0  2. Other disorder of eating, emotional eating Refill - topiramate  (TOPAMAX) 50 MG tablet; Take 1 tablet (50 mg total) by mouth 2 (two) times daily.  Dispense: 60 tablet; Refill: 0  3. Obesity,current BMI 42.28 Start Semaglutide-Weight Management (WEGOVY) 0.25 MG/0.5ML SOAJ Inject 0.25 mg into the skin once a week. Dispense: 2 mL, Refills: 0 ordered   Janet [name] is currently in the action stage of change. As such, her goal is to continue with weight loss efforts. She has agreed to the Category 3 Plan.   Exercise goals: For substantial health benefits, adults should do at least 150 minutes (2 hours and 30 minutes) a week of moderate-intensity, or 75 minutes (1 hour and 15 minutes) a week of vigorous-intensity aerobic physical activity, or an equivalent combination of moderate- and vigorous-intensity aerobic activity. Aerobic activity should be performed in episodes of at least 10 minutes, and preferably, it should be spread throughout the week.  Behavioral modification strategies: increasing lean protein intake, decreasing simple carbohydrates, increasing vegetables, increasing water intake, no skipping meals, meal planning and cooking strategies, and planning for success.  Janet [name] has agreed to follow-up with our clinic in 4 weeks. She was informed of the importance of frequent follow-up visits to maximize her success with intensive lifestyle modifications for her multiple health conditions.   Objective:   Blood pressure 113/77, pulse 69, temperature 98.2 F (36.8 C), height 5\' 7"  (1.702 m), weight 270 lb (122.5 kg), SpO2 98%. Body mass index is 42.29 kg/m.  General: Cooperative, alert, well developed, in no acute distress. HEENT: Conjunctivae  and lids unremarkable. Cardiovascular: Regular rhythm.  Lungs: Normal work of breathing. Neurologic: No focal deficits.   Lab Results  Component Value Date   CREATININE 0.82 07/17/2022   BUN 13 07/17/2022   NA 141 07/17/2022   K 3.7 07/17/2022   CL 104 07/17/2022   CO2 19 (L) 07/17/2022   Lab Results  Component  Value Date   ALT 11 07/17/2022   AST 11 07/17/2022   ALKPHOS 110 07/17/2022   BILITOT 0.4 07/17/2022   Lab Results  Component Value Date   HGBA1C 5.2 07/17/2022   HGBA1C 5.5 03/18/2022   HGBA1C 5.5 08/15/2021   HGBA1C 5.1 02/28/2021   HGBA1C 5.4 11/22/2019   Lab Results  Component Value Date   INSULIN 22.8 07/17/2022   INSULIN 13.8 08/15/2021   Lab Results  Component Value Date   TSH 0.011 (L) 07/22/2022   Lab Results  Component Value Date   CHOL 184 08/15/2021   HDL 36 (L) 08/15/2021   LDLCALC 122 (H) 08/15/2021   TRIG 147 08/15/2021   CHOLHDL 6.6 (H) 02/28/2021   Lab Results  Component Value Date   VD25OH 36.9 07/17/2022   VD25OH 26.6 (L) 03/18/2022   VD25OH 22.0 (L) 07/27/2021   Lab Results  Component Value Date   WBC 9.4 02/13/2022   HGB 12.7 02/13/2022   HCT 37.3 02/13/2022   MCV 87.6 02/13/2022   PLT 338 02/13/2022   Lab Results  Component Value Date   IRON 61 07/27/2021   TIBC 280 07/27/2021   FERRITIN 24 07/27/2021    Attestation Statements:   Reviewed by clinician on day of visit: allergies, medications, problem list, medical history, surgical history, family history, social history, and previous encounter notes.  I have reviewed the above documentation for accuracy and completeness, and I agree with the above. -  Davyon Fisch d. Deara Bober, NP-C

## 2022-10-22 ENCOUNTER — Telehealth (INDEPENDENT_AMBULATORY_CARE_PROVIDER_SITE_OTHER): Payer: Self-pay

## 2022-10-22 NOTE — Telephone Encounter (Signed)
PA submitted for [facility], waiting on a determination.

## 2022-10-23 ENCOUNTER — Encounter: Payer: Self-pay | Admitting: Family Medicine

## 2022-10-23 ENCOUNTER — Ambulatory Visit (INDEPENDENT_AMBULATORY_CARE_PROVIDER_SITE_OTHER): Payer: Managed Care, Other (non HMO)

## 2022-10-23 ENCOUNTER — Ambulatory Visit: Payer: Managed Care, Other (non HMO) | Admitting: Family Medicine

## 2022-10-23 VITALS — BP 112/69 | HR 76 | Temp 98.5°F | Ht 67.0 in | Wt 280.0 lb

## 2022-10-23 DIAGNOSIS — N182 Chronic kidney disease, stage 2 (mild): Secondary | ICD-10-CM | POA: Diagnosis not present

## 2022-10-23 DIAGNOSIS — E063 Autoimmune thyroiditis: Secondary | ICD-10-CM | POA: Diagnosis not present

## 2022-10-23 DIAGNOSIS — F419 Anxiety disorder, unspecified: Secondary | ICD-10-CM

## 2022-10-23 DIAGNOSIS — G4733 Obstructive sleep apnea (adult) (pediatric): Secondary | ICD-10-CM | POA: Diagnosis not present

## 2022-10-23 DIAGNOSIS — K219 Gastro-esophageal reflux disease without esophagitis: Secondary | ICD-10-CM

## 2022-10-23 DIAGNOSIS — Z6841 Body Mass Index (BMI) 40.0 and over, adult: Secondary | ICD-10-CM | POA: Diagnosis not present

## 2022-10-23 DIAGNOSIS — S8991XD Unspecified injury of right lower leg, subsequent encounter: Secondary | ICD-10-CM

## 2022-10-23 DIAGNOSIS — S8991XA Unspecified injury of right lower leg, initial encounter: Secondary | ICD-10-CM

## 2022-10-23 DIAGNOSIS — R110 Nausea: Secondary | ICD-10-CM

## 2022-10-23 DIAGNOSIS — K449 Diaphragmatic hernia without obstruction or gangrene: Secondary | ICD-10-CM

## 2022-10-23 DIAGNOSIS — M7989 Other specified soft tissue disorders: Secondary | ICD-10-CM

## 2022-10-23 MED ORDER — OMEPRAZOLE 20 MG PO CPDR
20.0000 mg | DELAYED_RELEASE_CAPSULE | Freq: Every day | ORAL | 3 refills | Status: AC
Start: 2022-10-23 — End: ?

## 2022-10-23 MED ORDER — BUSPIRONE HCL 5 MG PO TABS
ORAL_TABLET | ORAL | 0 refills | Status: DC
Start: 2022-10-23 — End: 2022-11-20

## 2022-10-23 MED ORDER — ONDANSETRON HCL 4 MG PO TABS
4.0000 mg | ORAL_TABLET | Freq: Three times a day (TID) | ORAL | 0 refills | Status: AC | PRN
Start: 2022-10-23 — End: ?

## 2022-10-23 MED ORDER — NORETHIN ACE-ETH ESTRAD-FE 1-20 MG-MCG PO TABS: 1.0000 | ORAL_TABLET | Freq: Every day | ORAL | 4 refills | Status: DC

## 2022-10-23 MED ORDER — FUROSEMIDE 20 MG PO TABS
ORAL_TABLET | ORAL | 3 refills | Status: DC
Start: 2022-10-23 — End: 2023-04-09

## 2022-10-23 NOTE — Progress Notes (Signed)
Subjective: CC: Chronic follow-up PCP: Raliegh Ip, DO Janet [name] is a 27 y.o. female presenting to clinic today for:  1.  Anxiety disorder Patient reports that she has been having increasing anxiety such that she does not often want to go anywhere.  She is on Lexapro 20 mg daily but is not entirely sure that it is very helpful.  She is not sure what would help.  There is no instigating factors for the anxiety but she finds her self isolating a lot at home and having some moodiness  2.  Hashimoto's thyroiditis She reports this is well-controlled.  Continues to follow-up with endocrinology regularly.  Fluid levels have gotten better but not totally resolved.  Needs Lasix renewed.  Will not be seeing Dr. Wolfgang Phoenix for several months  3.  Right knee pain Patient reports that she suddenly started developing right medial knee pain about 2 weeks ago.  It was quite swollen and bruised but both of these are going down.  Still has some residual bruising and swelling along the medial knee.  It has been cracking and popping.  No locking.  No instability.  No weakness.  No preceding injury.   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   Hypothyroidism    Kidney problem    Sleep apnea    Swallowing difficulty    Thyroid disease    Hashimotos    Vitamin D deficiency     Current Outpatient Medications:    escitalopram (LEXAPRO) 20 MG tablet, Take 1 tablet (20 mg total) by mouth at bedtime., Disp: 90 tablet, Rfl: 3   ferrous sulfate 324 (65 Fe) MG TBEC, Take 1 tablet (324 mg total) by mouth daily. (Patient taking differently: Take 324 mg by mouth at bedtime.), Disp: 90 tablet, Rfl: 3   furosemide (LASIX) 20  MG tablet, ALTERNATE 1 OR 2 TABLETS EVERY OTHER DAY AS DIRECTED, Disp: 60 tablet, Rfl: 3   ibuprofen (ADVIL) 200 MG tablet, Take 400-600 mg by mouth every 6 (six) hours as needed for headache., Disp: , Rfl:    levothyroxine (SYNTHROID) 100 MCG tablet, Take 1 tablet (100 mcg total) by mouth daily before breakfast., Disp: 90 tablet, Rfl: 3   levothyroxine (SYNTHROID) 112 MCG tablet, Take 1 tablet (112 mcg total) by mouth daily., Disp: 90 tablet, Rfl: 3   norethindrone-ethinyl estradiol-FE (LOESTRIN FE) 1-20 MG-MCG tablet, Take 1 tablet by mouth daily., Disp: 84 tablet, Rfl: 4   omeprazole (PRILOSEC) 20 MG capsule, Take 1 capsule (20 mg total) by mouth daily., Disp: 90 capsule, Rfl: 3   ondansetron (ZOFRAN) 4 MG tablet, Take 1 tablet (4 mg total) by mouth every 8 (eight) hours as needed for nausea or vomiting., Disp: 30 tablet, Rfl: 0   Semaglutide-Weight Management (WEGOVY) 0.25 MG/0.5ML SOAJ, Inject 0.25 mg into the skin once a week., Disp: 2 mL, Rfl: 0   SF 5000 PLUS 1.1 % CREA dental cream, Take by mouth every morning., Disp: , Rfl:    topiramate (TOPAMAX) 50 MG tablet, Take 1 tablet (50 mg total) by mouth 2 (two) times daily., Disp:  60 tablet, Rfl: 0   Vitamin D, Ergocalciferol, (DRISDOL) 1.25 MG (50000 UNIT) CAPS capsule, Take 1 capsule (50,000 Units total) by mouth every 7 (seven) days., Disp: 4 capsule, Rfl: 0   metoCLOPramide (REGLAN) 5 MG tablet, Take 1 tablet (5 mg total) by mouth 4 (four) times daily -  before meals and at bedtime., Disp: 120 tablet, Rfl: 1 Social History   Socioeconomic History   Marital status: Single    Spouse name: Not on file   Number of children: Not on file   Years of education: Not on file   Highest education level: Not on file  Occupational History   Occupation: Conservation officer, nature    Comment: Customer service   Occupation: Patient Registration  Tobacco Use   Smoking status: Some Days    Current packs/day: 0.50    Average packs/day: 0.5 packs/day for 2.0 years (1.0  ttl pk-yrs)    Types: Cigarettes   Smokeless tobacco: Never  Vaping Use   Vaping status: Never Used  Substance and Sexual Activity   Alcohol use: Not Currently    Comment: rare   Drug use: No   Sexual activity: Not Currently    Birth control/protection: None, Pill  Other Topics Concern   Not on file  Social History Narrative   Not on file   Social Determinants of Health   Financial Resource Strain: Medium Risk (04/07/2019)   Overall Financial Resource Strain (CARDIA)    Difficulty of Paying Living Expenses: Somewhat hard  Food Insecurity: No Food Insecurity (04/07/2019)   Hunger Vital Sign    Worried About Running Out of Food in the Last Year: Never true    Ran Out of Food in the Last Year: Never true  Transportation Needs: No Transportation Needs (04/07/2019)   PRAPARE - Administrator, Civil Service (Medical): No    Lack of Transportation (Non-Medical): No  Physical Activity: Insufficiently Active (04/07/2019)   Exercise Vital Sign    Days of Exercise per Week: 4 days    Minutes of Exercise per Session: 30 min  Stress: Stress Concern Present (04/07/2019)   [employer] of Occupational Health - Occupational Stress Questionnaire    Feeling of Stress : Rather much  Social Connections: Moderately Isolated (04/07/2019)   Social Connection and Isolation Panel [NHANES]    Frequency of Communication with Friends and Family: More than three times a week    Frequency of Social Gatherings with Friends and Family: Once a week    Attends Religious Services: 1 to 4 times per year    Active Member of [employer] or Organizations: No    Attends Banker Meetings: Never    Marital Status: Never married  Intimate Partner Violence: Not At Risk (04/07/2019)   Humiliation, Afraid, Rape, and Kick questionnaire    Fear of Current or Ex-Partner: No    Emotionally Abused: No    Physically Abused: No    Sexually Abused: No   Family History  Problem Relation Age of Onset    Hyperlipidemia Mother    Hypertension Mother    Diabetes Mother    Other Mother        gastropresis   Anxiety disorder Mother    Sleep apnea Mother    Other Father        syncope-has a pacemaker   COPD Father    Heart disease Father    Heart attack Maternal Grandmother    Heart Problems Maternal Grandfather    Heart attack Maternal  Grandfather    Cancer Paternal Grandmother    Cancer Paternal Grandfather     Objective: Office vital signs reviewed. BP 112/69   Pulse 76   Temp 98.5 F (36.9 C)   Ht 5\' 7"  (1.702 m)   Wt 280 lb (127 kg)   LMP 10/22/2022   SpO2 97%   BMI 43.85 kg/m   Physical Examination:  General: Awake, alert, morbidly obese, No acute distress HEENT: sclera white, MMM Cardio: regular rate and rhythm, S1S2 heard, no murmurs appreciated Pulm: clear to auscultation bilaterally, no wheezes, rhonchi or rales; normal work of breathing on room air MSK: Mild residual bruising noted along the medial aspect of the right knee.  She has some palpable fullness in this area and tenderness.  Palpable crepitus present.  Mild joint effusion.  Negative Thessaly.  Assessment/ Plan: 27 y.o. female   Hashimoto's thyroiditis  CKD (chronic kidney disease) stage 2, GFR 60-89 ml/min  BMI 40.0-44.9, adult (HCC) - Plan: Lipid Panel  OSA on CPAP  Injury of right knee, initial encounter - Plan: DG Knee 1-2 Views Right  Continue to follow-up with endocrinology for thyroid monitoring.  Sounds like she is in therapeutic and is now actively working with a weight loss provider.  I have placed a future order for lipid panel and she will have this collected with her next lab draw.  Continue CPAP for OSA.  Plain films of right knee ordered and upon personal review demonstrated no significant degenerative changes.  Mild medial compartment narrowing.  Again I still think this is likely soft tissue injury and have recommended physical therapy   Raliegh Ip, DO [facility] [phone]

## 2022-10-28 ENCOUNTER — Telehealth: Payer: Self-pay

## 2022-10-28 NOTE — Telephone Encounter (Signed)
Waynette Buttery, CMA 10/28/2022  4:44 PM EDT     Left vm for cb   Raliegh Ip, DO 10/28/2022  8:32 AM EDT     X-ray is negative.  Again I think probably wise for her to see physical therapist and/or orthopedist.  Please let me know which she prefers    Attempted to call pt back 2nd time no answer

## 2022-10-28 NOTE — Telephone Encounter (Signed)
PA for [facility] was denied. The reason given was that the patient did not meet the required guidelines. Patient notified via MyChart.

## 2022-11-18 ENCOUNTER — Ambulatory Visit: Payer: Self-pay | Admitting: Family Medicine

## 2022-11-18 ENCOUNTER — Encounter: Payer: Self-pay | Admitting: Family Medicine

## 2022-11-18 DIAGNOSIS — S8991XA Unspecified injury of right lower leg, initial encounter: Secondary | ICD-10-CM

## 2022-11-18 NOTE — Addendum Note (Signed)
Addended by: Hessie Diener on: 11/18/2022 04:38 PM   Modules accepted: Orders

## 2022-11-18 NOTE — Telephone Encounter (Signed)
Please see MyChart message regarding this, will close encounter.

## 2022-11-18 NOTE — Telephone Encounter (Signed)
Copied from CRM [id number]. Topic: Clinical - Red Word Triage >> Nov 18, 2022  8:42 AM Dennison Nancy wrote: Red Word that prompted transfer to Nurse Triage: red word swelling , patient has swelling in knee ,seen her provider several weeks ago , and still having issues , patient mom called for the daughter , daughter is at work . Call back # [phone]  Chief Complaint: information to follow up on treatment plan for knee Symptoms: knee Frequency: ongoing Pertinent Negatives: Patient denies n/a Disposition: [] ED /[] Urgent Care (no appt availability in office) / [] Appointment(In office/virtual)/ []  [place] [facility]/ [] Home Care/ [] Refused Recommended Disposition /[] [facility]/ []  Follow-up with PCP Additional Notes: patient to call back after making decision between PT and ortho Reason for Disposition  [1] Follow-up call to recent contact AND [2] information only call, no triage required  Answer Assessment - Initial Assessment Questions 1. REASON FOR CALL or QUESTION: "What is your reason for calling today?" or "How can I best help you?" or "What question do you have that I can help answer?"     Janet [name] called for Janet [name] to follow up on the providers plan for knee  issues.  Nurse informed Janet [name] that the last note in chart stated staff reached out to patient to offer PT or to be seen by ortho and provider waiting on response back from patient:  Janet [name] stated she will informed Janet [name] and someone would call back to inform office of Janet [name]'s decision in order to move forward with treatment plan.  Protocols used: Information Only Call - No Triage-A-AH

## 2022-11-20 ENCOUNTER — Encounter (INDEPENDENT_AMBULATORY_CARE_PROVIDER_SITE_OTHER): Payer: Self-pay | Admitting: Adult Health

## 2022-11-20 ENCOUNTER — Other Ambulatory Visit (INDEPENDENT_AMBULATORY_CARE_PROVIDER_SITE_OTHER): Payer: Self-pay | Admitting: Adult Health

## 2022-11-20 ENCOUNTER — Ambulatory Visit (INDEPENDENT_AMBULATORY_CARE_PROVIDER_SITE_OTHER): Payer: Managed Care, Other (non HMO) | Admitting: Adult Health

## 2022-11-20 ENCOUNTER — Other Ambulatory Visit: Payer: Self-pay | Admitting: Family Medicine

## 2022-11-20 VITALS — BP 122/75 | HR 81 | Temp 98.1°F | Ht 67.0 in | Wt 272.0 lb

## 2022-11-20 DIAGNOSIS — N182 Chronic kidney disease, stage 2 (mild): Secondary | ICD-10-CM

## 2022-11-20 DIAGNOSIS — E559 Vitamin D deficiency, unspecified: Secondary | ICD-10-CM | POA: Diagnosis not present

## 2022-11-20 DIAGNOSIS — F5089 Other specified eating disorder: Secondary | ICD-10-CM | POA: Diagnosis not present

## 2022-11-20 DIAGNOSIS — F419 Anxiety disorder, unspecified: Secondary | ICD-10-CM

## 2022-11-20 DIAGNOSIS — Z6841 Body Mass Index (BMI) 40.0 and over, adult: Secondary | ICD-10-CM

## 2022-11-20 MED ORDER — VITAMIN D (ERGOCALCIFEROL) 1.25 MG (50000 UNIT) PO CAPS: 50000.0000 [IU] | ORAL_CAPSULE | ORAL | 0 refills | Status: DC

## 2022-11-20 MED ORDER — TOPIRAMATE 50 MG PO TABS
50.0000 mg | ORAL_TABLET | Freq: Two times a day (BID) | ORAL | 0 refills | Status: DC
Start: 2022-11-20 — End: 2022-12-23

## 2022-11-20 NOTE — Progress Notes (Signed)
WEIGHT SUMMARY AND BIOMETRICS  Vitals Temp: 98.1 F (36.7 C) BP: 122/75 Pulse Rate: 81 SpO2: 97 %   Anthropometric Measurements Height: 5\' 7"  (1.702 m) Weight: 272 lb (123.4 kg) BMI (Calculated): 42.59 Weight at Last Visit: 270 lb Weight Lost Since Last Visit: 0 Weight Gained Since Last Visit: 2 lb Starting Weight: 284 lb Total Weight Loss (lbs): 12 lb (5.443 kg)   Body Composition  Body Fat %: 49.6 % Fat Mass (lbs): 135.2 lbs Muscle Mass (lbs): 130.2 lbs Total Body Water (lbs): 103.2 lbs Visceral Fat Rating : 13   Other Clinical Data Fasting: No Labs: No Today's Visit #: 15 Starting Date: 08/15/21    Chief Complaint:   OBESITY Janet [name] is here to discuss her progress with her obesity treatment plan. She is on the the Category 3 Plan and states she is following her eating plan approximately 75 % of the time.  She states she is exercising Walking 30 minutes 5-6 times per week.   Interim History:  10/21/2022 Loading dose of Wegovy Rx [place] in [place] was denied for GLP-1 therapy Per pt-she received a denial letter staring "she did not meet guidelines for treatment" She is unable to offer additional information  Her mother is at [facility] during OV  Of note- She endorses 100% compliance with  norethindrone-ethinyl estradiol-FE (LOESTRIN FE) 1-20 MG-MCG tablet Take 1 tablet by mouth daily. Dispense: 84 tablet, Refills: 4 ordered   She also denies being sexually active  Subjective:   1. Vitamin D deficiency  Latest Reference Range & Units 03/18/22 15:24 07/17/22 10:44  Vitamin D, 25-Hydroxy 30.0 - 100.0 ng/mL 26.6 (L) 36.9  (L): Data is abnormally low  She endorses intermittent fatigue She reports 100% compliance with weekly Ergocalciferol  2. Stage 2 chronic kidney disease  Latest Reference Range & Units 02/13/22 09:29 07/17/22 10:44  Creatinine 0.57 - 1.00 mg/dL 1.61 0.96    Latest Reference Range & Units 07/17/22 10:44  eGFR >59 mL/min/1.73 100     Latest Reference Range & Units 02/13/22 09:29  GFR, Estimated >60 mL/min >60   Condition stable and improving She is not on either an ACE or ARB  3. Other disorder of eating, emotional eating She denies hx of seizures or hx of nephrolithiasis  She is on Topamax 50mg  BID She denies paresthesias or wood finding problems  Assessment/Plan:   1. Vitamin D deficiency Refill - Vitamin D, Ergocalciferol, (DRISDOL) 1.25 MG (50000 UNIT) CAPS capsule; Take 1 capsule (50,000 Units total) by mouth every 7 (seven) days.  Dispense: 4 capsule; Refill: 0  2. Stage 2 chronic kidney disease Increase water intake Avoid Nephrotoxic substances Monitor Labs  3. Other disorder of eating, emotional eating Refill - topiramate (TOPAMAX) 50 MG tablet; Take 1 tablet (50 mg total) by mouth 2 (two) times daily.  Dispense: 60 tablet; Refill: 0  4. Morbid obesity (HCC), Current BMI 42.59 Call Insurance provided and inquire what SPECIFIC information is required to secure approval for [facility] therapy  [place] is currently in the action stage of change. As such, her goal is to continue with weight loss efforts. She has agreed to the Category 3 Plan.   Exercise goals: For substantial health benefits, adults should do at least 150 minutes (2 hours and 30 minutes) a week of moderate-intensity, or 75 minutes (1 hour and 15 minutes) a week of vigorous-intensity aerobic physical activity, or an equivalent combination of moderate- and vigorous-intensity aerobic activity. Aerobic activity should be performed in  episodes of at least 10 minutes, and preferably, it should be spread throughout the week.  Behavioral modification strategies: increasing lean protein intake, decreasing simple carbohydrates, increasing vegetables, increasing water intake, no skipping meals, meal planning and cooking strategies, keeping healthy foods in the home, and planning for success.  Ev[place] has agreed to follow-up with our clinic in 4 weeks. She was  informed of the importance of frequent follow-up visits to maximize her success with intensive lifestyle modifications for her multiple health conditions.   Check Fasting Labs (Forward Lipid panel to PCP) at next OV  Objective:   Blood pressure 122/75, pulse 81, temperature 98.1 F (36.7 C), height 5\' 7"  (1.702 m), weight 272 lb (123.4 kg), last menstrual period 10/11/2022, SpO2 97%. Body mass index is 42.6 kg/m.  General: Cooperative, alert, well developed, in no acute distress. HEENT: Conjunctivae and lids unremarkable. Cardiovascular: Regular rhythm.  Lungs: Normal work of breathing. Neurologic: No focal deficits.   Lab Results  Component Value Date   CREATININE 0.82 07/17/2022   BUN 13 07/17/2022   NA 141 07/17/2022   K 3.7 07/17/2022   CL 104 07/17/2022   CO2 19 (L) 07/17/2022   Lab Results  Component Value Date   ALT 11 07/17/2022   AST 11 07/17/2022   ALKPHOS 110 07/17/2022   BILITOT 0.4 07/17/2022   Lab Results  Component Value Date   HGBA1C 5.2 07/17/2022   HGBA1C 5.5 03/18/2022   HGBA1C 5.5 08/15/2021   HGBA1C 5.1 02/28/2021   HGBA1C 5.4 11/22/2019   Lab Results  Component Value Date   INSULIN 22.8 07/17/2022   INSULIN 13.8 08/15/2021   Lab Results  Component Value Date   TSH 0.011 (L) 07/22/2022   Lab Results  Component Value Date   CHOL 184 08/15/2021   HDL 36 (L) 08/15/2021   LDLCALC 122 (H) 08/15/2021   TRIG 147 08/15/2021   CHOLHDL 6.6 (H) 02/28/2021   Lab Results  Component Value Date   VD25OH 36.9 07/17/2022   VD25OH 26.6 (L) 03/18/2022   VD25OH 22.0 (L) 07/27/2021   Lab Results  Component Value Date   WBC 9.4 02/13/2022   HGB 12.7 02/13/2022   HCT 37.3 02/13/2022   MCV 87.6 02/13/2022   PLT 338 02/13/2022   Lab Results  Component Value Date   IRON 61 07/27/2021   TIBC 280 07/27/2021   FERRITIN 24 07/27/2021   Attestation Statements:   Reviewed by clinician on day of visit: allergies, medications, problem list, medical  history, surgical history, family history, social history, and previous encounter notes.  I have reviewed the above documentation for accuracy and completeness, and I agree with the above. -  Decie Verne d. Ilijah Doucet, NP-C

## 2022-11-25 ENCOUNTER — Encounter (INDEPENDENT_AMBULATORY_CARE_PROVIDER_SITE_OTHER): Payer: Self-pay

## 2022-12-04 ENCOUNTER — Other Ambulatory Visit: Payer: Managed Care, Other (non HMO)

## 2022-12-04 ENCOUNTER — Ambulatory Visit: Payer: Managed Care, Other (non HMO) | Admitting: Family Medicine

## 2022-12-04 ENCOUNTER — Encounter: Payer: Self-pay | Admitting: Orthopedic Surgery

## 2022-12-04 ENCOUNTER — Ambulatory Visit: Payer: Managed Care, Other (non HMO) | Admitting: Orthopedic Surgery

## 2022-12-04 VITALS — BP 112/73 | HR 75 | Ht 66.0 in | Wt 277.4 lb

## 2022-12-04 DIAGNOSIS — M25561 Pain in right knee: Secondary | ICD-10-CM | POA: Diagnosis not present

## 2022-12-04 DIAGNOSIS — G8929 Other chronic pain: Secondary | ICD-10-CM

## 2022-12-04 MED ORDER — PREDNISONE 10 MG (21) PO TBPK: ORAL_TABLET | ORAL | 0 refills | Status: DC

## 2022-12-04 NOTE — Patient Instructions (Signed)

## 2022-12-05 LAB — TSH: TSH: 0.983 u[IU]/mL (ref 0.450–4.500)

## 2022-12-05 LAB — T4, FREE: Free T4: 1.53 ng/dL (ref 0.82–1.77)

## 2022-12-05 NOTE — Progress Notes (Signed)
New Patient Visit  Assessment: Janet [name] is a 27 y.o. female with the following: 1. Chronic pain of right knee  Plan: Janet [name] has pain the right knee.  No specific injury.  No swelling on exam.  No laxity.  XR negative.  Popping and cracking she describes is not painful and is non specific.  Discussed multiple options including more consistent NSAIDs, bracing and home exercises vs PT.  She would like to try some home exercises.  She will contact the clinic if she has issues.   Follow-up: Return if symptoms worsen or fail to improve.  Subjective:  Chief Complaint  Patient presents with   Knee Pain    R/It has been hurting since the first week of October. It is cracking, popping and swelling. It had some bruising to begin with. I have used ice and heat, they helped some and the pain comes and goes. Taking Ibuprofen.    History of Present Illness: Janet [name] is a 27 y.o. female who has been referred by  Delynn Flavin, MD for evaluation of right knee pain.  She started to have pain approximately 2 months ago.  No specific injury.  She states she noticed some bruising over the medial knee, and is not sure what caused it.  Occasional ibuprofen, maybe 1-2 times per week.  No therapy or exercises.  She notes popping and cracking but this isn't painful.  No buckling.  Knee does not feel unstable.    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   Hypothyroidism    Kidney problem    Sleep apnea    Swallowing difficulty    Thyroid disease     Hashimotos    Vitamin D deficiency     Past Surgical History:  Procedure Laterality Date   BIOPSY  05/09/2020   Procedure: BIOPSY;  Surgeon: Dolores Frame, MD;  [facility]: [facility];  Service: Gastroenterology;;   CHOLECYSTECTOMY N/A 05/15/2020   Procedure: LAPAROSCOPIC CHOLECYSTECTOMY;  Surgeon: Lucretia Roers, MD;  Location: [facility];  Service: General;  Laterality: N/A;   ESOPHAGOGASTRODUODENOSCOPY (EGD) WITH PROPOFOL N/A 05/09/2020   Procedure: ESOPHAGOGASTRODUODENOSCOPY (EGD) WITH PROPOFOL;  Surgeon: Dolores Frame, MD;  [facility]: [facility];  Service: Gastroenterology;  Laterality: N/A;  AM   THYROIDECTOMY N/A 07/01/2017   Procedure: TOTAL THYROIDECTOMY;  Surgeon: Darnell Level, MD;  Location: [facility];  Service: General;  Laterality: N/A;   TONSILLECTOMY AND ADENOIDECTOMY Bilateral 01/2011   TOTAL THYROIDECTOMY     Dr. Gerrit Friends 07-01-17    Family History  Problem Relation Age of Onset   Hyperlipidemia Mother    Hypertension Mother    Diabetes Mother    Other Mother        gastropresis   Anxiety disorder Mother    Sleep apnea Mother    Other Father        syncope-has a pacemaker   COPD Father  Heart disease Father    Heart attack Maternal Grandmother    Heart Problems Maternal Grandfather    Heart attack Maternal Grandfather    Cancer Paternal Grandmother    Cancer Paternal Grandfather    Social History   Tobacco Use   Smoking status: Some Days    Current packs/day: 0.50    Average packs/day: 0.5 packs/day for 2.0 years (1.0 ttl pk-yrs)    Types: Cigarettes   Smokeless tobacco: Never  Vaping Use   Vaping status: Never Used  Substance Use Topics   Alcohol use: Not Currently    Comment: rare   Drug use: No    No Known Allergies  Current Meds  Medication Sig   busPIRone (BUSPAR) 5 MG tablet Take 2 tablets (10 mg total) by mouth 3 (three) times daily.   escitalopram (LEXAPRO) 20 MG tablet Take 1 tablet (20 mg total) by mouth at bedtime.    ferrous sulfate 324 (65 Fe) MG TBEC Take 1 tablet (324 mg total) by mouth daily. (Patient taking differently: Take 324 mg by mouth at bedtime.)   furosemide (LASIX) 20 MG tablet Alternate 1 or 2 tablets every other day as directed   ibuprofen (ADVIL) 200 MG tablet Take 400-600 mg by mouth every 6 (six) hours as needed for headache.   levothyroxine (SYNTHROID) 100 MCG tablet Take 1 tablet (100 mcg total) by mouth daily before breakfast.   levothyroxine (SYNTHROID) 112 MCG tablet Take 1 tablet (112 mcg total) by mouth daily.   norethindrone-ethinyl estradiol-FE (LOESTRIN FE) 1-20 MG-MCG tablet Take 1 tablet by mouth daily.   omeprazole (PRILOSEC) 20 MG capsule Take 1 capsule (20 mg total) by mouth daily.   ondansetron (ZOFRAN) 4 MG tablet Take 1 tablet (4 mg total) by mouth every 8 (eight) hours as needed for nausea or vomiting.   predniSONE (STERAPRED UNI-PAK 21 TAB) 10 MG (21) TBPK tablet 10 mg DS 12 as directed   Semaglutide-Weight Management (WEGOVY) 0.25 MG/0.5ML SOAJ Inject 0.25 mg into the skin once a week.   SF 5000 PLUS 1.1 % CREA dental cream Take by mouth every morning.   topiramate (TOPAMAX) 50 MG tablet Take 1 tablet (50 mg total) by mouth 2 (two) times daily.   Vitamin D, Ergocalciferol, (DRISDOL) 1.25 MG (50000 UNIT) CAPS capsule Take 1 capsule (50,000 Units total) by mouth every 7 (seven) days.    Objective: BP 112/73   Pulse 75   Ht 5\' 6"  (1.676 m)   Wt 277 lb 6 oz (125.8 kg)   LMP 10/11/2022   BMI 44.77 kg/m   Physical Exam:  General: Alert and oriented. and No acute distress. Gait: Normal gait.  No swelling.  No bruising.  No redness.  Mild tenderness over the medial knee.  Negative Lachman.  No increased laxity to varus or valgus stress.     IMAGING: I personally reviewed images previously obtained in clinic  Negative right knee XR   New Medications:  Meds ordered this encounter  Medications   predniSONE (STERAPRED UNI-PAK 21 TAB) 10 MG (21) TBPK tablet     Sig: 10 mg DS 12 as directed    Dispense:  48 tablet    Refill:  0      Oliver Barre, MD  12/05/2022 10:47 PM

## 2022-12-09 ENCOUNTER — Ambulatory Visit: Payer: Managed Care, Other (non HMO) | Admitting: Nurse Practitioner

## 2022-12-09 ENCOUNTER — Encounter: Payer: Self-pay | Admitting: Nurse Practitioner

## 2022-12-09 VITALS — BP 138/71 | HR 97 | Ht 66.0 in | Wt 278.8 lb

## 2022-12-09 DIAGNOSIS — E063 Autoimmune thyroiditis: Secondary | ICD-10-CM

## 2022-12-09 MED ORDER — LEVOTHYROXINE SODIUM 112 MCG PO TABS: 112.0000 ug | ORAL_TABLET | Freq: Every day | ORAL | 3 refills | Status: DC

## 2022-12-09 MED ORDER — LEVOTHYROXINE SODIUM 100 MCG PO TABS: 100.0000 ug | ORAL_TABLET | Freq: Every day | ORAL | 3 refills | Status: DC

## 2022-12-09 NOTE — Progress Notes (Signed)
Endocrinology Follow Up Note                                         12/09/2022, 3:12 PM  Subjective:   Subjective    Janet [name] is a 27 y.o.-year-old female patient being seen in follow up after being seen in consultation for hypothyroidism referred by Raliegh Ip, DO.   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   Hypothyroidism    Kidney problem    Sleep apnea    Swallowing difficulty    Thyroid disease    Hashimotos    Vitamin D deficiency     Past Surgical History:  Procedure Laterality Date   BIOPSY  05/09/2020   Procedure: BIOPSY;  Surgeon: Dolores Frame, MD;  [facility];  Service: Gastroenterology;;   CHOLECYSTECTOMY N/A 05/15/2020   Procedure: LAPAROSCOPIC CHOLECYSTECTOMY;  Surgeon: Lucretia Roers, MD;  Location: [facility];  Service: General;  Laterality: N/A;   ESOPHAGOGASTRODUODENOSCOPY (EGD) WITH PROPOFOL N/A 05/09/2020   Procedure: ESOPHAGOGASTRODUODENOSCOPY (EGD) WITH PROPOFOL;  Surgeon: Dolores Frame, MD;  [facility];  Service: Gastroenterology;  Laterality: N/A;  AM   THYROIDECTOMY N/A 07/01/2017   Procedure: TOTAL THYROIDECTOMY;  Surgeon: Darnell Level, MD;  Location: [facility];  Service: General;  Laterality: N/A;   TONSILLECTOMY AND ADENOIDECTOMY Bilateral 01/2011   TOTAL THYROIDECTOMY     Dr. Gerrit Friends 07-01-17    Social History   Socioeconomic History   Marital status: Single    Spouse name: Not on file   Number of children: Not on file   Years of education: Not on file   Highest education level: Not on file  Occupational History   Occupation: Conservation officer, nature    Comment: Customer service    Occupation: Patient Registration  Tobacco Use   Smoking status: Some Days    Current packs/day: 0.50    Average packs/day: 0.5 packs/day for 2.0 years (1.0 ttl pk-yrs)    Types: Cigarettes   Smokeless tobacco: Never  Vaping Use   Vaping status: Never Used  Substance and Sexual Activity   Alcohol use: Not Currently    Comment: rare   Drug use: No   Sexual activity: Not Currently    Birth control/protection: None, Pill  Other Topics Concern   Not on file  Social History Narrative   Not on file   Social Determinants of Health   Financial Resource Strain: Medium Risk (04/07/2019)   Overall Financial Resource Strain (CARDIA)    Difficulty of Paying Living Expenses: Somewhat hard  Food Insecurity: No Food Insecurity (04/07/2019)   Hunger Vital Sign    Worried  About Running Out of Food in the Last Year: Never true    Ran Out of Food in the Last Year: Never true  Transportation Needs: No Transportation Needs (04/07/2019)   PRAPARE - Administrator, Civil Service (Medical): No    Lack of Transportation (Non-Medical): No  Physical Activity: Insufficiently Active (04/07/2019)   Exercise Vital Sign    Days of Exercise per Week: 4 days    Minutes of Exercise per Session: 30 min  Stress: Stress Concern Present (04/07/2019)   [employer] of Occupational Health - Occupational Stress Questionnaire    Feeling of Stress : Rather much  Social Connections: Moderately Isolated (04/07/2019)   Social Connection and Isolation Panel [NHANES]    Frequency of Communication with Friends and Family: More than three times a week    Frequency of Social Gatherings with Friends and Family: Once a week    Attends Religious Services: 1 to 4 times per year    Active Member of [employer] or Organizations: No    Attends Engineer, structural: Never    Marital Status: Never married    Family History  Problem Relation Age of Onset   Hyperlipidemia Mother    Hypertension Mother    Diabetes  Mother    Other Mother        gastropresis   Anxiety disorder Mother    Sleep apnea Mother    Other Father        syncope-has a pacemaker   COPD Father    Heart disease Father    Heart attack Maternal Grandmother    Heart Problems Maternal Grandfather    Heart attack Maternal Grandfather    Cancer Paternal Grandmother    Cancer Paternal Grandfather     Outpatient Encounter Medications as of 12/09/2022  Medication Sig   escitalopram (LEXAPRO) 20 MG tablet Take 1 tablet (20 mg total) by mouth at bedtime.   ferrous sulfate 324 (65 Fe) MG TBEC Take 1 tablet (324 mg total) by mouth daily. (Patient taking differently: Take 324 mg by mouth at bedtime.)   furosemide (LASIX) 20 MG tablet Alternate 1 or 2 tablets every other day as directed   ibuprofen (ADVIL) 200 MG tablet Take 400-600 mg by mouth every 6 (six) hours as needed for headache.   norethindrone-ethinyl estradiol-FE (LOESTRIN FE) 1-20 MG-MCG tablet Take 1 tablet by mouth daily.   omeprazole (PRILOSEC) 20 MG capsule Take 1 capsule (20 mg total) by mouth daily.   ondansetron (ZOFRAN) 4 MG tablet Take 1 tablet (4 mg total) by mouth every 8 (eight) hours as needed for nausea or vomiting.   predniSONE (STERAPRED UNI-PAK 21 TAB) 10 MG (21) TBPK tablet 10 mg DS 12 as directed   SF 5000 PLUS 1.1 % CREA dental cream Take by mouth every morning.   topiramate (TOPAMAX) 50 MG tablet Take 1 tablet (50 mg total) by mouth 2 (two) times daily.   Vitamin D, Ergocalciferol, (DRISDOL) 1.25 MG (50000 UNIT) CAPS capsule Take 1 capsule (50,000 Units total) by mouth every 7 (seven) days.   [DISCONTINUED] levothyroxine (SYNTHROID) 100 MCG tablet Take 1 tablet (100 mcg total) by mouth daily before breakfast.   [DISCONTINUED] levothyroxine (SYNTHROID) 112 MCG tablet Take 1 tablet (112 mcg total) by mouth daily.   busPIRone (BUSPAR) 5 MG tablet Take 2 tablets (10 mg total) by mouth 3 (three) times daily.   levothyroxine (SYNTHROID) 100 MCG tablet Take 1 tablet  (100 mcg total) by mouth daily before breakfast.  levothyroxine (SYNTHROID) 112 MCG tablet Take 1 tablet (112 mcg total) by mouth daily.   [DISCONTINUED] metoCLOPramide (REGLAN) 5 MG tablet Take 1 tablet (5 mg total) by mouth 4 (four) times daily -  before meals and at bedtime.   [DISCONTINUED] Semaglutide-Weight Management (WEGOVY) 0.25 MG/0.5ML SOAJ Inject 0.25 mg into the skin once a week. (Patient not taking: Reported on 12/09/2022)   No facility-administered encounter medications on file as of 12/09/2022.    ALLERGIES: No Known Allergies VACCINATION STATUS: Immunization History  Administered Date(s) Administered   DTaP 08/14/1994, 10/09/1994, 12/11/1994, 09/23/1995, 08/08/1999   HIB (PRP-OMP) 08/14/1994, 10/09/1994, 12/11/1994, 09/23/1995   HPV Quadrivalent 10/02/2005, 12/02/2005, 07/31/2009   Hepatitis B 1994/04/03, 08/14/1994, 12/11/1994   IPV 08/14/1994, 10/09/1994, 12/11/1994, 08/08/1999   Influenza,inj,Quad PF,6+ Mos 10/26/2012, 01/21/2014, 11/25/2014, 10/16/2016, 10/03/2017, 08/31/2018, 11/22/2019, 11/14/2020   Influenza-Unspecified 08/31/2018, 10/09/2021, 10/12/2022   MMR 06/17/1995, 08/08/1999   Moderna Sars-Covid-2 Vaccination 05/24/2019, 06/21/2019, 01/25/2020   Td 07/31/2009, 11/22/2019   Tdap 07/31/2009, 01/22/2016   Varicella 09/23/1995     HPI   Janet [name]  is a patient with the above medical history. she was diagnosed with Hashimoto's thyroiditis complicated by large goiter at approximate age of 17 years, which required total thyroidectomy and subsequent initiation of thyroid hormone replacement. she was given various doses of thyroid hormone over the years, currently on Levothyroxine 212 micrograms. she reports compliance to this medication:  Taking it daily on empty stomach  with water, separated by >30 minutes before breakfast and other medications, and by at least 4 hours from calcium, iron, PPIs, multivitamins .  I reviewed patient's thyroid tests:  Lab  Results  Component Value Date   TSH 0.983 12/04/2022   TSH 0.011 (L) 07/22/2022   TSH <0.005 (L) 03/22/2022   TSH 2.730 10/29/2021   TSH 0.616 07/27/2021   TSH 6.440 (H) 04/16/2021   TSH 60.500 (H) 02/28/2021   TSH 301.000 (H) 12/20/2019   TSH 209.200 (H) 10/03/2017   TSH 9.04 (H) 04/16/2017   FREET4 1.53 12/04/2022   FREET4 1.56 07/22/2022   FREET4 1.77 03/22/2022   FREET4 1.48 10/29/2021   FREET4 1.26 07/27/2021   FREET4 1.04 04/16/2021   FREET4 0.80 (L) 02/28/2021   FREET4 0.56 (L) 04/16/2017   FREET4 0.64 12/25/2016   FREET4 0.71 10/16/2016    Pt denies feeling nodules in neck, hoarseness, dysphagia/odynophagia, SOB with lying down.  she denies family history of thyroid disorders.  No family history of thyroid cancer.  No history of radiation therapy to head or neck.  No recent use of iodine supplements.  Denies use of Biotin containing supplements.  I reviewed her chart and she also has a history of anemia, CKD, GERD, OSA.   Review of systems  Constitutional: + Minimally fluctuating body weight-going to weight loss clinic (being considered for Wegovy/Zepbound moving forward),  current Body mass index is 45 kg/m. , no fatigue, no subjective hyperthermia, no subjective hypothermia Eyes: no blurry vision, no xerophthalmia ENT: no sore throat, no nodules palpated in throat, no dysphagia/odynophagia, no hoarseness Cardiovascular: no chest pain, no shortness of breath, no palpitations, no leg swelling Respiratory: no cough, no shortness of breath Gastrointestinal: no nausea/vomiting Musculoskeletal: no muscle/joint aches Skin: no rashes, no hyperemia Neurological: no tremors, no numbness, no tingling, no dizziness Psychiatric: no depression, no anxiety   Objective:   Objective     BP 138/71 (BP Location: Left Arm, Patient Position: Sitting, Cuff Size: Large)   Pulse 97   Ht 5'  6" (1.676 m)   Wt 278 lb 12.8 oz (126.5 kg)   LMP 10/11/2022   BMI 45.00 kg/m  Wt  Readings from Last 3 Encounters:  12/09/22 278 lb 12.8 oz (126.5 kg)  12/04/22 277 lb 6 oz (125.8 kg)  11/20/22 272 lb (123.4 kg)    BP Readings from Last 3 Encounters:  12/09/22 138/71  12/04/22 112/73  11/20/22 122/75      Physical Exam- Limited  Constitutional:  Body mass index is 45 kg/m. , not in acute distress, normal state of mind Eyes:  EOMI, no exophthalmos Thyroid: No gross goiter- surgical scar from previous total thyroidectomy Musculoskeletal: no gross deformities, strength intact in all four extremities, no gross restriction of joint movements Skin:  no rashes, no hyperemia Neurological: no tremor with outstretched hands   CMP ( most recent) CMP     Component Value Date/Time   NA 141 07/17/2022 1044   K 3.7 07/17/2022 1044   CL 104 07/17/2022 1044   CO2 19 (L) 07/17/2022 1044   GLUCOSE 80 07/17/2022 1044   GLUCOSE 97 02/13/2022 0929   BUN 13 07/17/2022 1044   CREATININE 0.82 07/17/2022 1044   CALCIUM 9.1 07/17/2022 1044   PROT 7.5 07/17/2022 1044   ALBUMIN 4.2 07/17/2022 1044   AST 11 07/17/2022 1044   ALT 11 07/17/2022 1044   ALKPHOS 110 07/17/2022 1044   BILITOT 0.4 07/17/2022 1044   GFRNONAA >60 02/13/2022 0929   GFRAA 85 12/20/2019 1154     Diabetic Labs (most recent): Lab Results  Component Value Date   HGBA1C 5.2 07/17/2022   HGBA1C 5.5 03/18/2022   HGBA1C 5.5 08/15/2021     Lipid Panel ( most recent) Lipid Panel     Component Value Date/Time   CHOL 184 08/15/2021 1108   TRIG 147 08/15/2021 1108   HDL 36 (L) 08/15/2021 1108   CHOLHDL 6.6 (H) 02/28/2021 1457   LDLCALC 122 (H) 08/15/2021 1108   LABVLDL 26 08/15/2021 1108       Lab Results  Component Value Date   TSH 0.983 12/04/2022   TSH 0.011 (L) 07/22/2022   TSH <0.005 (L) 03/22/2022   TSH 2.730 10/29/2021   TSH 0.616 07/27/2021   TSH 6.440 (H) 04/16/2021   TSH 60.500 (H) 02/28/2021   TSH 301.000 (H) 12/20/2019   TSH 209.200 (H) 10/03/2017   TSH 9.04 (H) 04/16/2017    FREET4 1.53 12/04/2022   FREET4 1.56 07/22/2022   FREET4 1.77 03/22/2022   FREET4 1.48 10/29/2021   FREET4 1.26 07/27/2021   FREET4 1.04 04/16/2021   FREET4 0.80 (L) 02/28/2021   FREET4 0.56 (L) 04/16/2017   FREET4 0.64 12/25/2016   FREET4 0.71 10/16/2016      Latest Reference Range & Units 04/16/21 14:28 07/27/21 14:02 10/29/21 16:08 03/22/22 12:50 07/22/22 16:01 12/04/22 10:05  TSH 0.450 - 4.500 uIU/mL 6.440 (H) 0.616 2.730 <0.005 (L) 0.011 (L) 0.983  T4,Free(Direct) 0.82 - 1.77 ng/dL 1.61 0.96 0.45 4.09 8.11 1.53  (H): Data is abnormally high (L): Data is abnormally low Assessment & Plan:   ASSESSMENT / PLAN:  1. Hypothyroidism-S/P total thyroidectomy for goiter r/t Hashimoto's  Her most recent thyroid labs are consistent with appropriate hormone replacement.  She is advised to continue her Levothyroxine 212 mcg (1 of 100 mcg and 1 of 112 mcg tabs) po daily before breakfast.    - We discussed about correct intake of levothyroxine, at fasting, with water, separated by at least 30 minutes from breakfast, and  separated by more than 4 hours from calcium, iron, multivitamins, acid reflux medications (PPIs). -Patient is made aware of the fact that thyroid hormone replacement is needed for life, dose to be adjusted by periodic monitoring of thyroid function tests.  -Her thyroid ultrasound shows no residual thyroid tissue after total thyroidectomy.  No need for further imaging at this time.     I spent  36  minutes in the care of the patient today including review of labs from Thyroid Function, CMP, and other relevant labs ; imaging/biopsy records (current and previous including abstractions from other facilities); face-to-face time discussing  her lab results and symptoms, medications doses, her options of short and long term treatment based on the latest standards of care / guidelines;   and documenting the encounter.  Janet [name]  participated in the discussions, expressed  understanding, and voiced agreement with the above plans.  All questions were answered to her satisfaction. she is encouraged to contact clinic should she have any questions or concerns prior to her return visit.   FOLLOW UP PLAN:  Return in about 6 months (around 06/09/2023) for Thyroid follow up, Previsit labs.  Ronny Bacon, [facility] [address] [place], [place] [address] Phone: [phone] Fax: [phone]  12/09/2022, 3:12 PM

## 2022-12-18 ENCOUNTER — Other Ambulatory Visit (INDEPENDENT_AMBULATORY_CARE_PROVIDER_SITE_OTHER): Payer: Self-pay | Admitting: Adult Health

## 2022-12-18 DIAGNOSIS — F5089 Other specified eating disorder: Secondary | ICD-10-CM

## 2022-12-23 ENCOUNTER — Ambulatory Visit (INDEPENDENT_AMBULATORY_CARE_PROVIDER_SITE_OTHER): Payer: Managed Care, Other (non HMO) | Admitting: Adult Health

## 2022-12-23 ENCOUNTER — Encounter (INDEPENDENT_AMBULATORY_CARE_PROVIDER_SITE_OTHER): Payer: Self-pay | Admitting: Adult Health

## 2022-12-23 VITALS — BP 128/78 | HR 68 | Temp 98.0°F | Ht 66.0 in | Wt 285.0 lb

## 2022-12-23 DIAGNOSIS — N182 Chronic kidney disease, stage 2 (mild): Secondary | ICD-10-CM | POA: Diagnosis not present

## 2022-12-23 DIAGNOSIS — F5089 Other specified eating disorder: Secondary | ICD-10-CM

## 2022-12-23 DIAGNOSIS — Z6841 Body Mass Index (BMI) 40.0 and over, adult: Secondary | ICD-10-CM

## 2022-12-23 DIAGNOSIS — E88819 Insulin resistance, unspecified: Secondary | ICD-10-CM

## 2022-12-23 DIAGNOSIS — E559 Vitamin D deficiency, unspecified: Secondary | ICD-10-CM | POA: Diagnosis not present

## 2022-12-23 DIAGNOSIS — E063 Autoimmune thyroiditis: Secondary | ICD-10-CM | POA: Diagnosis not present

## 2022-12-23 MED ORDER — TOPIRAMATE 50 MG PO TABS: 50.0000 mg | ORAL_TABLET | Freq: Two times a day (BID) | ORAL | 0 refills | Status: DC

## 2022-12-23 MED ORDER — VITAMIN D (ERGOCALCIFEROL) 1.25 MG (50000 UNIT) PO CAPS: 50000.0000 [IU] | ORAL_CAPSULE | ORAL | 0 refills | Status: DC

## 2022-12-23 MED ORDER — WEGOVY 0.25 MG/0.5ML ~~LOC~~ SOAJ: 0.2500 mg | SUBCUTANEOUS | 0 refills | Status: DC

## 2022-12-23 NOTE — Progress Notes (Signed)
WEIGHT SUMMARY AND BIOMETRICS  Vitals Temp: 98 F (36.7 C) BP: 128/78 Pulse Rate: 68 SpO2: 99 %   Anthropometric Measurements Height: 5\' 6"  (1.676 m) Weight: 285 lb (129.3 kg) BMI (Calculated): 46.02 Weight at Last Visit: 272lb Weight Lost Since Last Visit: 0 Weight Gained Since Last Visit: 13lb Starting Weight: 284lb Total Weight Loss (lbs): 0 lb (0 kg) Peak Weight: 0   Body Composition  Body Fat %: 54.4 % Fat Mass (lbs): 155.4 lbs Muscle Mass (lbs): 123.8 lbs Visceral Fat Rating : 15   Other Clinical Data Fasting: yes Labs: yes Today's Visit #: 16 Starting Date: 08/15/21    Chief Complaint:   OBESITY Janet [name] is here to discuss her progress with her obesity treatment plan. She is on the the Category 3 Plan and states she is following her eating plan approximately 60 % of the time.  She states she is exercising Walking at work- 5 days/week.   Interim History:  12/04/2022- Orthopedics started her on Steroid Dose Pak She will complete course tomorrow. Since starting oral steroids, she endorses increased appetite and subsequent snacking. She also reports insomnia and increased lower extremity edema  Wegovy Rx sent in early Oct 2024- insurance denies coverage. She denies family hx of MEN 2 or MTC She denies personal hx of pancreatitis  Indications for Wegovy Therapy: Current BMI 44.8 Insulin Resistance HLD- Mixed  She is not sexually active and reports %100 compliance on Loestrin Fe 1-20mg /mcg  Reviewed Bioimpedance results with pt and pt's mother: Muscle Mass:-6.4 lbs Adipose Mass: +20.4 lbs  Of note- Her PCP provided month of samples of Wegovy 0.25mg  (Feb 2024) She tolerated well, denies SE  Subjective:   1. Hashimoto's thyroiditis 12/27/2022 OV Notes/Endocrinology HPI    Janet [name]  is a patient with the above medical history. she was diagnosed with Hashimoto's thyroiditis complicated by large goiter at approximate age of 17 years,  which required total thyroidectomy and subsequent initiation of thyroid hormone replacement. she was given various doses of thyroid hormone over the years, currently on Levothyroxine 212 micrograms. she reports compliance to this medication:  Taking it daily on empty stomach  with water, separated by >30 minutes before breakfast and other medications, and by at least 4 hours from calcium, iron, PPIs, multivitamins .  2. Insulin resistance  Latest Reference Range & Units 08/15/21 11:08 07/17/22 10:44  INSULIN 2.6 - 24.9 uIU/mL 13.8 22.8   She reports increased appetite since starting Steroid Dose pack for R knee pain She endorses more snacking the last several weeks  3. Vitamin D deficiency  Latest Reference Range & Units 03/18/22 15:24 07/17/22 10:44  Vitamin D, 25-Hydroxy 30.0 - 100.0 ng/mL 26.6 (L) 36.9  (L): Data is abnormally low  She endorses fatigue, likely r/t to working overtime at work. 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 Stage 2 chronic kidney disease  Latest Reference Range & Units 07/27/21 14:02 02/13/22 09:29 07/17/22 10:44  Creatinine 0.57 - 1.00 mg/dL 6.04 5.40 9.81    Latest Reference Range & Units 07/17/22 10:44  eGFR >59 mL/min/1.73 100   BP stable at OV She is not on ACE or ARB therapy  Assessment/Plan:   1. Hashimoto's thyroiditis 12/27/2022 OV Notes/Endocrinology  ASSESSMENT / PLAN:    1. Hypothyroidism-S/P total thyroidectomy for goiter r/t Hashimoto's   Her most recent thyroid labs are consistent with appropriate hormone replacement.  She is advised  to continue her Levothyroxine 212 mcg (1 of 100 mcg and 1 of 112 mcg tabs) po daily before breakfast.     - We discussed about correct intake of levothyroxine, at fasting, with water, separated by at least 30 minutes from breakfast, and separated by more than 4 hours from calcium, iron, multivitamins, acid reflux medications (PPIs). -Patient is made aware  of the fact that thyroid hormone replacement is needed for life, dose to be adjusted by periodic monitoring of thyroid function tests.   -Her thyroid ultrasound shows no residual thyroid tissue after total thyroidectomy.  No need for further imaging at this time.   2. Insulin resistance Check Labs - Hemoglobin A1c - Insulin, random - Vitamin B12  3. Vitamin D deficiency Check Labs and Refill - VITAMIN D 25 Hydroxy (Vit-D Deficiency, Fractures) - Vitamin D, Ergocalciferol, (DRISDOL) 1.25 MG (50000 UNIT) CAPS capsule; Take 1 capsule (50,000 Units total) by mouth every 7 (seven) days.  Dispense: 4 capsule; Refill: 0  4. Stage 2 chronic kidney disease (Primary) Check Labs - Comprehensive metabolic panel  6. Other disorder of eating, emotional eating Refill - topiramate (TOPAMAX) 50 MG tablet; Take 1 tablet (50 mg total) by mouth 2 (two) times daily.  Dispense: 60 tablet; Refill: 0  5. Morbid obesity (HCC), Current BMI 46.02 Start Semaglutide-Weight Management (WEGOVY) 0.25 MG/0.5ML SOAJ Inject 0.25 mg into the skin once a week. Dispense: 2 mL, Refills: 0 ordered   Pt Appeal Paperwork completed, faxed to [employer] is not currently in the action stage of change. As such, her goal is to get back to weightloss efforts . She has agreed to the Category 3 Plan.   Exercise goals: No exercise has been prescribed at this time.  Behavioral modification strategies: increasing lean protein intake, decreasing simple carbohydrates, increasing vegetables, increasing water intake, no skipping meals, meal planning and cooking strategies, keeping healthy foods in the home, ways to avoid boredom eating, holiday eating strategies , and planning for success.  Aslin has agreed to follow-up with our clinic in 4 weeks. She was informed of the importance of frequent follow-up visits to maximize her success with intensive lifestyle modifications for her multiple health conditions.    Objective:    Blood pressure 128/78, pulse 68, temperature 98 F (36.7 C), height 5\' 6"  (1.676 m), weight 285 lb (129.3 kg), SpO2 99%. Body mass index is 46 kg/m.  General: Cooperative, alert, well developed, in no acute distress. HEENT: Conjunctivae and lids unremarkable. Cardiovascular: Regular rhythm.  Lungs: Normal work of breathing. Neurologic: No focal deficits.   Lab Results  Component Value Date   CREATININE 0.82 07/17/2022   BUN 13 07/17/2022   NA 141 07/17/2022   K 3.7 07/17/2022   CL 104 07/17/2022   CO2 19 (L) 07/17/2022   Lab Results  Component Value Date   ALT 11 07/17/2022   AST 11 07/17/2022   ALKPHOS 110 07/17/2022   BILITOT 0.4 07/17/2022   Lab Results  Component Value Date   HGBA1C 5.2 07/17/2022   HGBA1C 5.5 03/18/2022   HGBA1C 5.5 08/15/2021   HGBA1C 5.1 02/28/2021   HGBA1C 5.4 11/22/2019   Lab Results  Component Value Date   INSULIN 22.8 07/17/2022   INSULIN 13.8 08/15/2021   Lab Results  Component Value Date   TSH 0.983 12/04/2022   Lab Results  Component Value Date   CHOL 184 08/15/2021   HDL 36 (L) 08/15/2021   LDLCALC 122 (H) 08/15/2021   TRIG 147  08/15/2021   CHOLHDL 6.6 (H) 02/28/2021   Lab Results  Component Value Date   VD25OH 36.9 07/17/2022   VD25OH 26.6 (L) 03/18/2022   VD25OH 22.0 (L) 07/27/2021   Lab Results  Component Value Date   WBC 9.4 02/13/2022   HGB 12.7 02/13/2022   HCT 37.3 02/13/2022   MCV 87.6 02/13/2022   PLT 338 02/13/2022   Lab Results  Component Value Date   IRON 61 07/27/2021   TIBC 280 07/27/2021   FERRITIN 24 07/27/2021   Attestation Statements:   Reviewed by clinician on day of visit: allergies, medications, problem list, medical history, surgical history, family history, social history, and previous encounter notes.  I have reviewed the above documentation for accuracy and completeness, and I agree with the above. -  Hatsumi Steinhart d. Aidyn Kellis, NP-C

## 2022-12-24 LAB — COMPREHENSIVE METABOLIC PANEL
ALT: 11 [IU]/L (ref 0–32)
AST: 8 [IU]/L (ref 0–40)
Albumin: 3.8 g/dL — ABNORMAL LOW (ref 4.0–5.0)
Alkaline Phosphatase: 81 [IU]/L (ref 44–121)
BUN/Creatinine Ratio: 12 (ref 9–23)
BUN: 11 mg/dL (ref 6–20)
Bilirubin Total: 0.3 mg/dL (ref 0.0–1.2)
CO2: 19 mmol/L — ABNORMAL LOW (ref 20–29)
Calcium: 8.4 mg/dL — ABNORMAL LOW (ref 8.7–10.2)
Chloride: 104 mmol/L (ref 96–106)
Creatinine, Ser: 0.94 mg/dL (ref 0.57–1.00)
Globulin, Total: 2.2 g/dL (ref 1.5–4.5)
Glucose: 77 mg/dL (ref 70–99)
Potassium: 4.1 mmol/L (ref 3.5–5.2)
Sodium: 139 mmol/L (ref 134–144)
Total Protein: 6 g/dL (ref 6.0–8.5)
eGFR: 85 mL/min/{1.73_m2} (ref >59)

## 2022-12-24 LAB — VITAMIN D 25 HYDROXY (VIT D DEFICIENCY, FRACTURES): Vit D, 25-Hydroxy: 31.1 ng/mL (ref 30.0–100.0)

## 2022-12-24 LAB — VITAMIN B12: Vitamin B-12: 304 pg/mL (ref 232–1245)

## 2022-12-24 LAB — HEMOGLOBIN A1C
Est. average glucose Bld gHb Est-mCnc: 111 mg/dL
Hgb A1c MFr Bld: 5.5 % (ref 4.8–5.6)

## 2022-12-24 LAB — INSULIN, RANDOM: INSULIN: 15.1 u[IU]/mL (ref 2.6–24.9)

## 2023-01-02 ENCOUNTER — Encounter (INDEPENDENT_AMBULATORY_CARE_PROVIDER_SITE_OTHER): Payer: Self-pay

## 2023-01-29 ENCOUNTER — Other Ambulatory Visit (INDEPENDENT_AMBULATORY_CARE_PROVIDER_SITE_OTHER): Payer: Self-pay | Admitting: Adult Health

## 2023-01-29 ENCOUNTER — Ambulatory Visit (INDEPENDENT_AMBULATORY_CARE_PROVIDER_SITE_OTHER): Payer: Managed Care, Other (non HMO) | Admitting: Adult Health

## 2023-01-29 ENCOUNTER — Encounter (INDEPENDENT_AMBULATORY_CARE_PROVIDER_SITE_OTHER): Payer: Self-pay | Admitting: Adult Health

## 2023-01-29 VITALS — BP 120/74 | HR 79 | Temp 97.8°F | Ht 66.0 in | Wt 278.0 lb

## 2023-01-29 DIAGNOSIS — F5089 Other specified eating disorder: Secondary | ICD-10-CM

## 2023-01-29 DIAGNOSIS — E88819 Insulin resistance, unspecified: Secondary | ICD-10-CM

## 2023-01-29 DIAGNOSIS — N182 Chronic kidney disease, stage 2 (mild): Secondary | ICD-10-CM

## 2023-01-29 DIAGNOSIS — E559 Vitamin D deficiency, unspecified: Secondary | ICD-10-CM

## 2023-01-29 DIAGNOSIS — E063 Autoimmune thyroiditis: Secondary | ICD-10-CM

## 2023-01-29 DIAGNOSIS — Z6841 Body Mass Index (BMI) 40.0 and over, adult: Secondary | ICD-10-CM

## 2023-01-29 MED ORDER — METFORMIN HCL 500 MG PO TABS: ORAL_TABLET | ORAL | 0 refills | Status: DC

## 2023-01-29 MED ORDER — VITAMIN D (ERGOCALCIFEROL) 1.25 MG (50000 UNIT) PO CAPS: 50000.0000 [IU] | ORAL_CAPSULE | ORAL | 0 refills | Status: DC

## 2023-01-29 MED ORDER — TOPIRAMATE 50 MG PO TABS: 50.0000 mg | ORAL_TABLET | Freq: Two times a day (BID) | ORAL | 0 refills | Status: DC

## 2023-01-29 NOTE — Progress Notes (Signed)
WEIGHT SUMMARY AND BIOMETRICS  Vitals Temp: 97.8 F (36.6 C) BP: 120/74 Pulse Rate: 79 SpO2: 99 %   Anthropometric Measurements Height: 5\' 6"  (1.676 m) Weight: 278 lb (126.1 kg) BMI (Calculated): 44.89 Weight at Last Visit: 285lb Weight Lost Since Last Visit: 7lb Weight Gained Since Last Visit: 0 Starting Weight: 284lb Total Weight Loss (lbs): 6 lb (2.722 kg) Peak Weight: 0   Body Composition  Body Fat %: 51.7 % Fat Mass (lbs): 144.2 lbs Muscle Mass (lbs): 127.8 lbs Total Body Water (lbs): 105 lbs Visceral Fat Rating : 14   Other Clinical Data Fasting: no Labs: no Today's Visit #: 17 Starting Date: 08/15/21    Chief Complaint:   OBESITY Janet [name] is here to discuss her progress with her obesity treatment plan. She is on the the Category 3 Plan and states she is following her eating plan approximately 70 % of the time.  She states she is exercising Walking 30 minutes 5 times per week.   Interim History:  Her insurance would not cover Wegovy She never started GLP-1 therapy Instead she increased protein and water intake. She is thrilled to have lost 7 lbs since last OV at [facility] approximately 4 weeks ago  Of note- Her mother is at [facility] during OV  Subjective:   1. Hashimoto's thyroiditis Endocrinology manages- Levothryroxine and 112 mcg daily She reports fatigue She reports recent increase in overall hunger levels  2. Insulin resistance Discussed Labs  Latest Reference Range & Units 12/23/22 10:47  Glucose 70 - 99 mg/dL 77  Hemoglobin N6E 4.8 - 5.6 % 5.5  Est. average glucose Bld gHb Est-mCnc mg/dL 952  INSULIN 2.6 - 84.1 uIU/mL 15.1   CBG and A1c at goal Insulin level above goal, however decreased from last check. Her insurance denies coverage of Wegovy. Discussed risks/benefits of Metformin therapy- she is agreeable to start  3. Vitamin D deficiency Discussed Labs  Latest Reference Range & Units 12/23/22 10:47  Vitamin D, 25-Hydroxy  30.0 - 100.0 ng/mL 31.1   Below goal of 50-70 She is on weekly Ergocalciferol- denies N/V/Muscle Weakness  4. Stage 2 chronic kidney disease Discussed Labs  Latest Reference Range & Units 07/17/22 10:44 12/23/22 10:47  Creatinine 0.57 - 1.00 mg/dL 3.24 4.01    Latest Reference Range & Units 07/17/22 10:44 12/23/22 10:47  eGFR >59 mL/min/1.73 100 85   Kidney fx stable  5. Other disorder of eating, emotional eating She is 100% compliant on Loestrin FE and she is also sexually abstinent. She denies hx of seizure d/o She is on BID Topamax 50mg   Assessment/Plan:   1. Hashimoto's thyroiditis (Primary) Continue Synthroid as directed by Endocrinology   2. Insulin resistance Start metFORMIN (GLUCOPHAGE) 500 MG tablet 1/2 tab with lunch daily for one week, then increase to 1 full tablet at lunch Dispense: 3 tablet, Refills: 0 ordered   3. Vitamin D deficiency Refill and INCREASE Vitamin D, Ergocalciferol, (DRISDOL) 1.25 MG (50000 UNIT) CAPS capsule Take 1 capsule (50,000 Units total) by mouth every 3 (three) days. Dispense: 16 capsule, Refills: 0 ordered   4. Stage 2 chronic kidney disease Continue to avoid Nephrotoxic substances Monitor labs  5. Other disorder of eating, emotional eating Refill topiramate (TOPAMAX) 50 MG tablet Take 1 tablet (50 mg total) by mouth 2 (two) times daily. Dispense: 60 tablet, Refills: 0 ordered   6. Morbid obesity (HCC), Current BMI 44.89  Tamiia is currently in the action stage of change. As such, her  goal is to continue with weight loss efforts. She has agreed to the Category 3 Plan.   Exercise goals: For substantial health benefits, adults should do at least 150 minutes (2 hours and 30 minutes) a week of moderate-intensity, or 75 minutes (1 hour and 15 minutes) a week of vigorous-intensity aerobic physical activity, or an equivalent combination of moderate- and vigorous-intensity aerobic activity. Aerobic activity should be performed in episodes of  at least 10 minutes, and preferably, it should be spread throughout the week.  Behavioral modification strategies: increasing lean protein intake, decreasing simple carbohydrates, increasing vegetables, increasing water intake, no skipping meals, meal planning and cooking strategies, keeping healthy foods in the home, ways to avoid boredom eating, and planning for success.  [name] has agreed to follow-up with our clinic in 4 weeks. She was informed of the importance of frequent follow-up visits to maximize her success with intensive lifestyle modifications for her multiple health conditions.   Objective:   Blood pressure 120/74, pulse 79, temperature 97.8 F (36.6 C), height 5\' 6"  (1.676 m), weight 278 lb (126.1 kg), SpO2 99%. Body mass index is 44.87 kg/m.  General: Cooperative, alert, well developed, in no acute distress. HEENT: Conjunctivae and lids unremarkable. Cardiovascular: Regular rhythm.  Lungs: Normal work of breathing. Neurologic: No focal deficits.   Lab Results  Component Value Date   CREATININE 0.94 12/23/2022   BUN 11 12/23/2022   NA 139 12/23/2022   K 4.1 12/23/2022   CL 104 12/23/2022   CO2 19 (L) 12/23/2022   Lab Results  Component Value Date   ALT 11 12/23/2022   AST 8 12/23/2022   ALKPHOS 81 12/23/2022   BILITOT 0.3 12/23/2022   Lab Results  Component Value Date   HGBA1C 5.5 12/23/2022   HGBA1C 5.2 07/17/2022   HGBA1C 5.5 03/18/2022   HGBA1C 5.5 08/15/2021   HGBA1C 5.1 02/28/2021   Lab Results  Component Value Date   INSULIN 15.1 12/23/2022   INSULIN 22.8 07/17/2022   INSULIN 13.8 08/15/2021   Lab Results  Component Value Date   TSH 0.983 12/04/2022   Lab Results  Component Value Date   CHOL 184 08/15/2021   HDL 36 (L) 08/15/2021   LDLCALC 122 (H) 08/15/2021   TRIG 147 08/15/2021   CHOLHDL 6.6 (H) 02/28/2021   Lab Results  Component Value Date   VD25OH 31.1 12/23/2022   VD25OH 36.9 07/17/2022   VD25OH 26.6 (L) 03/18/2022   Lab  Results  Component Value Date   WBC 9.4 02/13/2022   HGB 12.7 02/13/2022   HCT 37.3 02/13/2022   MCV 87.6 02/13/2022   PLT 338 02/13/2022   Lab Results  Component Value Date   IRON 61 07/27/2021   TIBC 280 07/27/2021   FERRITIN 24 07/27/2021   Attestation Statements:   Reviewed by clinician on day of visit: allergies, medications, problem list, medical history, surgical history, family history, social history, and previous encounter notes.  I have reviewed the above documentation for accuracy and completeness, and I agree with the above. -  Lukka Black d. Dax Murguia, NP-C

## 2023-02-07 ENCOUNTER — Other Ambulatory Visit: Payer: Managed Care, Other (non HMO)

## 2023-02-25 ENCOUNTER — Other Ambulatory Visit (INDEPENDENT_AMBULATORY_CARE_PROVIDER_SITE_OTHER): Payer: Self-pay | Admitting: Adult Health

## 2023-02-25 DIAGNOSIS — F5089 Other specified eating disorder: Secondary | ICD-10-CM

## 2023-02-26 ENCOUNTER — Ambulatory Visit (INDEPENDENT_AMBULATORY_CARE_PROVIDER_SITE_OTHER): Payer: Managed Care, Other (non HMO) | Admitting: Adult Health

## 2023-02-26 ENCOUNTER — Encounter (INDEPENDENT_AMBULATORY_CARE_PROVIDER_SITE_OTHER): Payer: Self-pay | Admitting: Adult Health

## 2023-02-26 VITALS — BP 134/87 | HR 83 | Temp 98.1°F | Ht 66.0 in | Wt 274.0 lb

## 2023-02-26 DIAGNOSIS — E063 Autoimmune thyroiditis: Secondary | ICD-10-CM | POA: Diagnosis not present

## 2023-02-26 DIAGNOSIS — E88819 Insulin resistance, unspecified: Secondary | ICD-10-CM

## 2023-02-26 DIAGNOSIS — Z6841 Body Mass Index (BMI) 40.0 and over, adult: Secondary | ICD-10-CM

## 2023-02-26 DIAGNOSIS — E559 Vitamin D deficiency, unspecified: Secondary | ICD-10-CM

## 2023-02-26 DIAGNOSIS — F5089 Other specified eating disorder: Secondary | ICD-10-CM

## 2023-02-26 DIAGNOSIS — N182 Chronic kidney disease, stage 2 (mild): Secondary | ICD-10-CM | POA: Diagnosis not present

## 2023-02-26 MED ORDER — TOPIRAMATE 50 MG PO TABS: 50.0000 mg | ORAL_TABLET | Freq: Two times a day (BID) | ORAL | 0 refills | Status: DC

## 2023-02-26 MED ORDER — VITAMIN D (ERGOCALCIFEROL) 1.25 MG (50000 UNIT) PO CAPS: 50000.0000 [IU] | ORAL_CAPSULE | ORAL | 0 refills | Status: DC

## 2023-02-26 MED ORDER — METFORMIN HCL 500 MG PO TABS: ORAL_TABLET | ORAL | 0 refills | Status: DC

## 2023-02-26 NOTE — Progress Notes (Signed)
WEIGHT SUMMARY AND BIOMETRICS  Vitals Temp: 98.1 F (36.7 C) BP: 134/87 Pulse Rate: 83 SpO2: 97 %   Anthropometric Measurements Height: 5\' 6"  (1.676 m) Weight: 274 lb (124.3 kg) BMI (Calculated): 44.25 Weight at Last Visit: 278lb Weight Lost Since Last Visit: 4lb Weight Gained Since Last Visit: 0 Starting Weight: 284lb Total Weight Loss (lbs): 10 lb (4.536 kg)   Body Composition  Body Fat %: 51.4 % Fat Mass (lbs): 141.2 lbs Muscle Mass (lbs): 126.6 lbs Total Body Water (lbs): 102.8 lbs Visceral Fat Rating : 14   Other Clinical Data Fasting: yes Labs: no Today's Visit #: 18 Starting Date: 08/15/21    Chief Complaint:   OBESITY Janet [name] is here to discuss her progress with her obesity treatment plan.  She is on the the Category 3 Plan and states she is following her eating plan approximately 70 % of the time.  She states she is exercising Walking and Vibration Plate 16/10-96 minutes 5/4 times per week.   Interim History:  01/29/2023- started Metformin, she has titrated up to one full tablet She denies GI upset She is thrilled to have lost 4 lbs since last OV  She endorses stable energy levels and reports using her "Vibration Plate" each evening prior to bed.  Of note- her mother is at [facility] during OV.  Mother is able to offer amplifying social and health hx/  Subjective:   1. Hashimoto's thyroiditis 06/30/2017 History of Present Illness   The patient is a 28 year old female who presents with a complaint of Enlarged thyroid.   CHIEF COMPLAINT: Hashimoto thyroiditis, thyroid goiter, compressive symptoms   Patient is referred by Dr. Carlus Pavlov for surgical evaluation and management of Hashimoto's thyroiditis with thyroid goiter with compressive symptoms.  Patient has been symptomatic for 2-3 years.  She is now taking levothyroxine.  She is currently taking 250 g daily.  Most recent TSH level remained elevated at 9.04.  Patient last had an ultrasound  examination in 2015.  This showed an enlarged thyroid gland without nodules.  There was heterogeneous parenchyma consistent with thyroiditis.  Laboratory studies confirmed Hashimoto's thyroiditis.  Patient complains of multiple compressive symptoms including solid food dysphagia, alterations in voice quality, right sided neck pain, and globus sensation.  Patient has had no prior head or neck surgery other than tonsillectomy.  There is no family history of thyroid disease or other endocrine neoplasms.  Patient works at [employer].  She is accompanied today by her mother. Assessment & Plan   HYPOTHYROIDISM DUE TO HASHIMOTO'S THYROIDITIS (E03.8) ENLARGED THYROID (E04.9)   Pt Education - Pamphlet Given - The Thyroid Book: discussed with patient and provided information.   Patient presents on referral from her endocrinologist for evaluation for thyroidectomy for surgical management of Hashimoto's thyroiditis, thyroid goiter, and mild to moderate compressive symptoms.  Patient is accompanied by her family.  They're provided with written literature to review at home.   I discussed total thyroidectomy with the patient.  We discussed the surgical procedure.  We discussed the hospital stay to be anticipated.  We discussed postoperative recovery and return to work.  We discussed potential complications from the surgery including changes in voice quality and problems with maintaining calcium levels.  We discussed the location and size of her surgical incision.  Patient understands and wishes to proceed with surgery in the near future.   The risks and benefits of the procedure have been discussed at length with the patient.  The  patient understands the proposed procedure, potential alternative treatments, and the course of recovery to be expected.  All of the patient's questions have been answered at this time.  The patient wishes to proceed with surgery.  2. Insulin resistance  Her insurance would not cover  [facility] She never started GLP-1 therapy Instead she increased protein and water intake.  01/29/2023- started Metformin, she has titrated up to one full tablet She denies GI upset  3. Stage 2 chronic kidney disease BP elevated at OV  Latest Reference Range & Units 07/27/21 14:02 02/13/22 09:29 07/17/22 10:44 12/23/22 10:47  Creatinine 0.57 - 1.00 mg/dL 2.95 6.21 3.08 6.57    Latest Reference Range & Units 07/17/22 10:44 12/23/22 10:47  eGFR >59 mL/min/1.73 100 85   She is not on ACE or ARB Kidney fx is stable  4. Vitamin D deficiency  Latest Reference Range & Units 03/18/22 15:24 07/17/22 10:44 12/23/22 10:47  Vitamin D, 25-Hydroxy 30.0 - 100.0 ng/mL 26.6 (L) 36.9 31.1  (L): Data is abnormally low She is on bi- weekly Ergocalciferol  7. Other disorder of eating, emotional eating She is 100% compliant on Loestrin FE and she is also sexually abstinent. She denies hx of seizure d/o She is on BID Topamax 50mg    Assessment/Plan:   1. Hashimoto's thyroiditis (Primary) Continue regular f/u with Endocrinology  2. Insulin resistance Refill metFORMIN (GLUCOPHAGE) 500 MG tablet 1 full tablet at lunch Dispense: 30 tablet, Refills: 0 ordered   3. Stage 2 chronic kidney disease Keep BP well controlled Avoid Nephrotoxic substances Monitor labs  4. Vitamin D deficiency Refill - Vitamin D, Ergocalciferol, (DRISDOL) 1.25 MG (50000 UNIT) CAPS capsule; Take 1 capsule (50,000 Units total) by mouth every 3 (three) days.  Dispense: 16 capsule; Refill: 0  5. Other disorder of eating, emotional eating Refill - topiramate (TOPAMAX) 50 MG tablet; Take 1 tablet (50 mg total) by mouth 2 (two) times daily.  Dispense: 60 tablet; Refill: 0  6. Morbid obesity (HCC), Current BMI 44.25  Lorette is currently in the action stage of change. As such, her goal is to continue with weight loss efforts. She has agreed to the Category 3 Plan.   Exercise goals: For substantial health benefits, adults should do  at least 150 minutes (2 hours and 30 minutes) a week of moderate-intensity, or 75 minutes (1 hour and 15 minutes) a week of vigorous-intensity aerobic physical activity, or an equivalent combination of moderate- and vigorous-intensity aerobic activity. Aerobic activity should be performed in episodes of at least 10 minutes, and preferably, it should be spread throughout the week.  Behavioral modification strategies: increasing lean protein intake, decreasing simple carbohydrates, increasing vegetables, increasing water intake, no skipping meals, meal planning and cooking strategies, keeping healthy foods in the home, and planning for success.  Samantha has agreed to follow-up with our clinic in 4 weeks. She was informed of the importance of frequent follow-up visits to maximize her success with intensive lifestyle modifications for her multiple health conditions.   Check Fasting Labs at Next OV  Objective:   Blood pressure 134/87, pulse 83, temperature 98.1 F (36.7 C), height 5\' 6"  (1.676 m), weight 274 lb (124.3 kg), SpO2 97%. Body mass index is 44.22 kg/m.  General: Cooperative, alert, well developed, in no acute distress. HEENT: Conjunctivae and lids unremarkable. Cardiovascular: Regular rhythm.  Lungs: Normal work of breathing. Neurologic: No focal deficits.   Lab Results  Component Value Date   CREATININE 0.94 12/23/2022   BUN 11 12/23/2022  NA 139 12/23/2022   K 4.1 12/23/2022   CL 104 12/23/2022   CO2 19 (L) 12/23/2022   Lab Results  Component Value Date   ALT 11 12/23/2022   AST 8 12/23/2022   ALKPHOS 81 12/23/2022   BILITOT 0.3 12/23/2022   Lab Results  Component Value Date   HGBA1C 5.5 12/23/2022   HGBA1C 5.2 07/17/2022   HGBA1C 5.5 03/18/2022   HGBA1C 5.5 08/15/2021   HGBA1C 5.1 02/28/2021   Lab Results  Component Value Date   INSULIN 15.1 12/23/2022   INSULIN 22.8 07/17/2022   INSULIN 13.8 08/15/2021   Lab Results  Component Value Date   TSH 0.983  12/04/2022   Lab Results  Component Value Date   CHOL 184 08/15/2021   HDL 36 (L) 08/15/2021   LDLCALC 122 (H) 08/15/2021   TRIG 147 08/15/2021   CHOLHDL 6.6 (H) 02/28/2021   Lab Results  Component Value Date   VD25OH 31.1 12/23/2022   VD25OH 36.9 07/17/2022   VD25OH 26.6 (L) 03/18/2022   Lab Results  Component Value Date   WBC 9.4 02/13/2022   HGB 12.7 02/13/2022   HCT 37.3 02/13/2022   MCV 87.6 02/13/2022   PLT 338 02/13/2022   Lab Results  Component Value Date   IRON 61 07/27/2021   TIBC 280 07/27/2021   FERRITIN 24 07/27/2021   Attestation Statements:   Reviewed by clinician on day of visit: allergies, medications, problem list, medical history, surgical history, family history, social history, and previous encounter notes.  I have reviewed the above documentation for accuracy and completeness, and I agree with the above. -  Kailana Benninger d.Allanna Bresee, NP-C

## 2023-04-02 ENCOUNTER — Other Ambulatory Visit (INDEPENDENT_AMBULATORY_CARE_PROVIDER_SITE_OTHER): Payer: Self-pay | Admitting: Adult Health

## 2023-04-02 DIAGNOSIS — F5089 Other specified eating disorder: Secondary | ICD-10-CM

## 2023-04-09 ENCOUNTER — Ambulatory Visit (INDEPENDENT_AMBULATORY_CARE_PROVIDER_SITE_OTHER): Payer: Managed Care, Other (non HMO) | Admitting: Adult Health

## 2023-04-09 VITALS — BP 117/79 | HR 69 | Temp 97.7°F | Ht 66.0 in | Wt 268.0 lb

## 2023-04-09 DIAGNOSIS — N182 Chronic kidney disease, stage 2 (mild): Secondary | ICD-10-CM | POA: Diagnosis not present

## 2023-04-09 DIAGNOSIS — Z6841 Body Mass Index (BMI) 40.0 and over, adult: Secondary | ICD-10-CM

## 2023-04-09 DIAGNOSIS — E88819 Insulin resistance, unspecified: Secondary | ICD-10-CM

## 2023-04-09 DIAGNOSIS — E559 Vitamin D deficiency, unspecified: Secondary | ICD-10-CM | POA: Diagnosis not present

## 2023-04-09 DIAGNOSIS — F5089 Other specified eating disorder: Secondary | ICD-10-CM

## 2023-04-09 MED ORDER — TOPIRAMATE 50 MG PO TABS: 50.0000 mg | ORAL_TABLET | Freq: Two times a day (BID) | ORAL | 0 refills | Status: DC

## 2023-04-09 MED ORDER — METFORMIN HCL 500 MG PO TABS: ORAL_TABLET | ORAL | 0 refills | Status: DC

## 2023-04-09 MED ORDER — VITAMIN D (ERGOCALCIFEROL) 1.25 MG (50000 UNIT) PO CAPS
50000.0000 [IU] | ORAL_CAPSULE | ORAL | 0 refills | Status: DC
Start: 2023-04-09 — End: 2023-05-22

## 2023-04-09 NOTE — Progress Notes (Signed)
 WEIGHT SUMMARY AND BIOMETRICS  Vitals Temp: 97.7 F (36.5 C) BP: 117/79 Pulse Rate: 69 SpO2: 97 %   Anthropometric Measurements Height: 5\' 6"  (1.676 m) Weight: 268 lb (121.6 kg) BMI (Calculated): 43.28 Weight at Last Visit: 274 lb Weight Lost Since Last Visit: 6 l Weight Gained Since Last Visit: 0 Starting Weight: 284 lb Total Weight Loss (lbs): 16 lb (7.258 kg)   Body Composition  Body Fat %: 49.4 % Fat Mass (lbs): 132.6 lbs Muscle Mass (lbs): 129.2 lbs Total Body Water (lbs): 99 lbs Visceral Fat Rating : 13   Other Clinical Data Fasting: yes Labs: yes Today's Visit #: 19 Starting Date: 08/15/21    Chief Complaint:   OBESITY Janet [name] is here to discuss her progress with her obesity treatment plan.  She is on the the Category 3 Plan and states she is following her eating plan approximately 70 % of the time.  She states she is exercising Walking 30 minutes 5 times per week.   Interim History:  01/29/2023- started Metformin, she has titrated up to one full 500mg  tablet She denies GI upset and reports her appetite is stable- no unusual cravings  Exercise-She is using Walk Fit App- She is walking 5 times per week at least  Hydration-she is drinking water,  mushroom coffee, small amount of sweat tea  Of Note- her wonderful mother is at [facility] during OV  Subjective:   1. Insulin resistance  Latest Reference Range & Units 08/15/21 11:08 07/17/22 10:44 12/23/22 10:47  INSULIN 2.6 - 24.9 uIU/mL 13.8 22.8 15.1   01/29/2023- started Metformin, she has titrated up to one full 500mg  tablet She denies GI upset and reports her appetite is stable- no unusual cravings  2. Vitamin D deficiency  Latest Reference Range & Units 03/18/22 15:24 07/17/22 10:44 12/23/22 10:47  Vitamin D, 25-Hydroxy 30.0 - 100.0 ng/mL 26.6 (L) 36.9 31.1  (L): Data is abnormally low  She endorses stable energy levels She is on bi-weekly Ergocalciferol- denies N/V/Muscle Weakness  3.  Stage 2 chronic kidney disease  Latest Reference Range & Units 02/13/22 09:29 07/17/22 10:44 12/23/22 10:47  Creatinine 0.57 - 1.00 mg/dL 1.61 0.96 0.45    Latest Reference Range & Units 07/27/21 14:02 07/17/22 10:44 12/23/22 10:47  eGFR >59 mL/min/1.73 89 100 85   BP excellent at OV She is not on ACE or ARB therapy  4. Emotional Eating BP and HR stable at OV She denies parethesia or word finding problems She is on BID Topomax 50mg  She endorses stable appetite  Assessment/Plan:    1. Insulin resistance Check Labs - Vitamin B12 - Hemoglobin A1c - Insulin, random Refill  metFORMIN (GLUCOPHAGE) 500 MG tablet 1 full tablet at lunch Dispense: 30 tablet, Refills: 0 ordered   2. Vitamin D deficiency Check Labs - VITAMIN D 25 Hydroxy (Vit-D Deficiency, Fractures) Refill  Vitamin D, Ergocalciferol, (DRISDOL) 1.25 MG (50000 UNIT) CAPS capsule Take 1 capsule (50,000 Units total) by mouth every 3 (three) days. Dispense: 16 capsule, Refills: 0 ordered   3. Stage 2 chronic kidney disease Check Labs - Comprehensive metabolic panel with GFR Continue to keep BP well controlled Avoid Nephrotoxic substances  4.Emotional Eating Refill   topiramate (TOPAMAX) 50 MG tablet Take 1 tablet (50 mg total) by mouth 2 (two) times daily. Dispense: 60 tablet, Refills: 0 ordered   5.  Morbid obesity (HCC), Current BMI 43.4  Janet [name] is currently in the action stage of change. As such, her goal  is to continue with weight loss efforts. She has agreed to the Category 3 Plan.   Exercise goals: For substantial health benefits, adults should do at least 150 minutes (2 hours and 30 minutes) a week of moderate-intensity, or 75 minutes (1 hour and 15 minutes) a week of vigorous-intensity aerobic physical activity, or an equivalent combination of moderate- and vigorous-intensity aerobic activity. Aerobic activity should be performed in episodes of at least 10 minutes, and preferably, it should be spread  throughout the week.  Behavioral modification strategies: increasing lean protein intake, decreasing simple carbohydrates, increasing vegetables, increasing water intake, meal planning and cooking strategies, keeping healthy foods in the home, ways to avoid boredom eating, and planning for success.  Janet [name] has agreed to follow-up with our clinic in 4 weeks. She was informed of the importance of frequent follow-up visits to maximize her success with intensive lifestyle modifications for her multiple health conditions.   Janet [name] was informed we would discuss her lab results at her next visit unless there is a critical issue that needs to be addressed sooner. Janet [name] agreed to keep her next visit at the agreed upon time to discuss these results.  Objective:   Blood pressure 117/79, pulse 69, temperature 97.7 F (36.5 C), height 5\' 6"  (1.676 m), weight 268 lb (121.6 kg), SpO2 97%. Body mass index is 43.26 kg/m.  General: Cooperative, alert, well developed, in no acute distress. HEENT: Conjunctivae and lids unremarkable. Cardiovascular: Regular rhythm.  Lungs: Normal work of breathing. Neurologic: No focal deficits.   Lab Results  Component Value Date   CREATININE 0.94 12/23/2022   BUN 11 12/23/2022   NA 139 12/23/2022   K 4.1 12/23/2022   CL 104 12/23/2022   CO2 19 (L) 12/23/2022   Lab Results  Component Value Date   ALT 11 12/23/2022   AST 8 12/23/2022   ALKPHOS 81 12/23/2022   BILITOT 0.3 12/23/2022   Lab Results  Component Value Date   HGBA1C 5.5 12/23/2022   HGBA1C 5.2 07/17/2022   HGBA1C 5.5 03/18/2022   HGBA1C 5.5 08/15/2021   HGBA1C 5.1 02/28/2021   Lab Results  Component Value Date   INSULIN 15.1 12/23/2022   INSULIN 22.8 07/17/2022   INSULIN 13.8 08/15/2021   Lab Results  Component Value Date   TSH 0.983 12/04/2022   Lab Results  Component Value Date   CHOL 184 08/15/2021   HDL 36 (L) 08/15/2021   LDLCALC 122 (H) 08/15/2021   TRIG 147 08/15/2021   CHOLHDL  6.6 (H) 02/28/2021   Lab Results  Component Value Date   VD25OH 31.1 12/23/2022   VD25OH 36.9 07/17/2022   VD25OH 26.6 (L) 03/18/2022   Lab Results  Component Value Date   WBC 9.4 02/13/2022   HGB 12.7 02/13/2022   HCT 37.3 02/13/2022   MCV 87.6 02/13/2022   PLT 338 02/13/2022   Lab Results  Component Value Date   IRON 61 07/27/2021   TIBC 280 07/27/2021   FERRITIN 24 07/27/2021   Attestation Statements:   Reviewed by clinician on day of visit: allergies, medications, problem list, medical history, surgical history, family history, social history, and previous encounter notes.  I have reviewed the above documentation for accuracy and completeness, and I agree with the above. - Narayan Scull d. Cyprian Gongaware, NP-C

## 2023-04-10 LAB — COMPREHENSIVE METABOLIC PANEL WITH GFR
ALT: 12 IU/L (ref 0–32)
AST: 13 IU/L (ref 0–40)
Albumin: 4.1 g/dL (ref 4.0–5.0)
Alkaline Phosphatase: 94 IU/L (ref 44–121)
BUN/Creatinine Ratio: 16 (ref 9–23)
BUN: 14 mg/dL (ref 6–20)
Bilirubin Total: 0.4 mg/dL (ref 0.0–1.2)
CO2: 19 mmol/L — ABNORMAL LOW (ref 20–29)
Calcium: 9.1 mg/dL (ref 8.7–10.2)
Chloride: 104 mmol/L (ref 96–106)
Creatinine, Ser: 0.88 mg/dL (ref 0.57–1.00)
Globulin, Total: 2.8 g/dL (ref 1.5–4.5)
Glucose: 87 mg/dL (ref 70–99)
Potassium: 4.1 mmol/L (ref 3.5–5.2)
Sodium: 139 mmol/L (ref 134–144)
Total Protein: 6.9 g/dL (ref 6.0–8.5)
eGFR: 92 mL/min/{1.73_m2} (ref >59)

## 2023-04-10 LAB — HEMOGLOBIN A1C
Est. average glucose Bld gHb Est-mCnc: 105 mg/dL
Hgb A1c MFr Bld: 5.3 % (ref 4.8–5.6)

## 2023-04-10 LAB — VITAMIN D 25 HYDROXY (VIT D DEFICIENCY, FRACTURES): Vit D, 25-Hydroxy: 71.4 ng/mL (ref 30.0–100.0)

## 2023-04-10 LAB — INSULIN, RANDOM: INSULIN: 17.5 u[IU]/mL (ref 2.6–24.9)

## 2023-04-10 LAB — VITAMIN B12: Vitamin B-12: 238 pg/mL (ref 232–1245)

## 2023-04-23 ENCOUNTER — Ambulatory Visit: Payer: Managed Care, Other (non HMO) | Admitting: Family Medicine

## 2023-04-23 ENCOUNTER — Encounter: Payer: Self-pay | Admitting: Family Medicine

## 2023-04-23 ENCOUNTER — Other Ambulatory Visit (HOSPITAL_COMMUNITY)
Admission: RE | Admit: 2023-04-23 | Discharge: 2023-04-23 | Disposition: A | Discharge status: Home / Self Care | Source: Ambulatory Visit | Attending: Family Medicine | Admitting: Family Medicine

## 2023-04-23 VITALS — BP 129/82 | HR 85 | Temp 98.0°F | Ht 66.0 in | Wt 272.0 lb

## 2023-04-23 DIAGNOSIS — N920 Excessive and frequent menstruation with regular cycle: Secondary | ICD-10-CM

## 2023-04-23 DIAGNOSIS — Z6841 Body Mass Index (BMI) 40.0 and over, adult: Secondary | ICD-10-CM

## 2023-04-23 DIAGNOSIS — E559 Vitamin D deficiency, unspecified: Secondary | ICD-10-CM

## 2023-04-23 DIAGNOSIS — Z124 Encounter for screening for malignant neoplasm of cervix: Secondary | ICD-10-CM | POA: Diagnosis not present

## 2023-04-23 DIAGNOSIS — Z23 Encounter for immunization: Secondary | ICD-10-CM

## 2023-04-23 DIAGNOSIS — E7800 Pure hypercholesterolemia, unspecified: Secondary | ICD-10-CM | POA: Diagnosis not present

## 2023-04-23 DIAGNOSIS — R600 Localized edema: Secondary | ICD-10-CM

## 2023-04-23 DIAGNOSIS — E063 Autoimmune thyroiditis: Secondary | ICD-10-CM

## 2023-04-23 DIAGNOSIS — E88819 Insulin resistance, unspecified: Secondary | ICD-10-CM

## 2023-04-23 DIAGNOSIS — Z0001 Encounter for general adult medical examination with abnormal findings: Secondary | ICD-10-CM

## 2023-04-23 DIAGNOSIS — R39198 Other difficulties with micturition: Secondary | ICD-10-CM

## 2023-04-23 DIAGNOSIS — Z0000 Encounter for general adult medical examination without abnormal findings: Secondary | ICD-10-CM

## 2023-04-23 DIAGNOSIS — N182 Chronic kidney disease, stage 2 (mild): Secondary | ICD-10-CM

## 2023-04-23 MED ORDER — FUROSEMIDE 40 MG PO TABS: 20.0000 mg | ORAL_TABLET | Freq: Every day | ORAL | 3 refills | Status: DC | PRN

## 2023-04-23 MED ORDER — NORETHIN ACE-ETH ESTRAD-FE 1.5-30 MG-MCG PO TABS
1.0000 | ORAL_TABLET | Freq: Every day | ORAL | 4 refills | Status: DC
Start: 2023-04-23 — End: 2023-08-13

## 2023-04-23 MED ORDER — FERROUS SULFATE 324 (65 FE) MG PO TBEC: 324.0000 mg | DELAYED_RELEASE_TABLET | Freq: Every day | ORAL | 3 refills | Status: AC

## 2023-04-23 NOTE — Progress Notes (Signed)
 Janet [name] is a 28 y.o. female presents to office today for annual physical exam examination.    Concerns today include: 1.  Edema She reports that she continues to have edema if she does not utilize her Lasix.  She has actually been using 40 mg.  Her nephrologist told her she could use 20 to 40 mg but the 40 mg works better.  She reports difficulty with urine output without the medication.  This is despite drinking quite a bit of water during the day to try and stay hydrated.  She has been actively working on weight loss with success thus far but it really has not changed much of her swelling.  Her thyroid levels have been under good control with current regimen so no overt explanation as to why she continues to retain fluid.  He had actually recommended that she consider evaluation with a bladder specialist and/or liver specialist.  She would like to go ahead and proceed with bladder specialist first followed by liver specialist if the bladder specialist does not find a thing anatomically wrong with her bladder.  She limits her salt and tries to stay physically active again to try and continue to promote her weight loss  2.  Abnormal uterine bleeding She reports breakthrough bleeding and occasional abnormal vaginal discharge.  She is compliant with her Loestrin FE 1-20 mg/micrograms daily.  Her menstrual cycles are not totally regular but typically only last about 3 to 4 days.  She is sexually active with 1 partner.  She denies any pelvic pain, dyspareunia.  No breast changes, discharge or pain.  Occupation: Works in Administrator, Marital status: Has boyfriend, Substance use: None Health Maintenance Due  Topic Date Due   Pneumococcal Vaccine 40-41 Years old (1 of 2 - PCV) Never done   COVID-19 Vaccine (4 - 2024-25 season) 09/08/2022   Refills needed today: Iron, Lasix  Immunization History  Administered Date(s) Administered   DTaP 08/14/1994, 10/09/1994, 12/11/1994, 09/23/1995, 08/08/1999    HIB (PRP-OMP) 08/14/1994, 10/09/1994, 12/11/1994, 09/23/1995   HPV Quadrivalent 10/02/2005, 12/02/2005, 07/31/2009   Hepatitis B 25-Nov-1994, 08/14/1994, 12/11/1994   IPV 08/14/1994, 10/09/1994, 12/11/1994, 08/08/1999   Influenza,inj,Quad PF,6+ Mos 10/26/2012, 01/21/2014, 11/25/2014, 10/16/2016, 10/03/2017, 08/31/2018, 11/22/2019, 11/14/2020   Influenza-Unspecified 08/31/2018, 10/09/2021, 10/12/2022   MMR 06/17/1995, 08/08/1999   Moderna Sars-Covid-2 Vaccination 05/24/2019, 06/21/2019, 01/25/2020   Td 07/31/2009, 11/22/2019   Tdap 07/31/2009, 01/22/2016   Varicella 09/23/1995   Past Medical History:  Diagnosis Date   Allergy    Anemia    Anemia    Anxiety    Bilateral swelling of feet    Chronic kidney disease    stage 2 she says    Fatty liver    GERD (gastroesophageal reflux disease)    Headache(784.0)    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   Hypothyroidism    Kidney problem    Sleep apnea    Swallowing difficulty    Thyroid disease    Hashimotos    Vitamin D deficiency    Social History   Socioeconomic History   Marital status: Single    Spouse name: Not on file   Number of children: Not on file   Years of education: Not on file   Highest education level: Associate degree: occupational, Scientist, product/process development, or vocational program  Occupational History   Occupation: Conservation officer, nature    Comment: Customer service   Occupation: Patient Registration  Tobacco Use   Smoking status: Some Days    Current  packs/day: 0.50    Average packs/day: 0.5 packs/day for 2.0 years (1.0 ttl pk-yrs)    Types: Cigarettes   Smokeless tobacco: Never  Vaping Use   Vaping status: Never Used  Substance and Sexual Activity   Alcohol use: Not Currently    Comment: rare   Drug use: No   Sexual activity: Not Currently    Birth control/protection: None, Pill  Other Topics Concern   Not on file  Social History Narrative   Not on file   Social Drivers of Health   Financial Resource Strain: Low  Risk  (04/22/2023)   Overall Financial Resource Strain (CARDIA)    Difficulty of Paying Living Expenses: Not hard at all  Food Insecurity: No Food Insecurity (04/22/2023)   Hunger Vital Sign    Worried About Running Out of Food in the Last Year: Never true    Ran Out of Food in the Last Year: Never true  Transportation Needs: No Transportation Needs (04/22/2023)   PRAPARE - Administrator, Civil Service (Medical): No    Lack of Transportation (Non-Medical): No  Physical Activity: Insufficiently Active (04/22/2023)   Exercise Vital Sign    Days of Exercise per Week: 3 days    Minutes of Exercise per Session: 30 min  Stress: No Stress Concern Present (04/22/2023)   [employer] of Occupational Health - Occupational Stress Questionnaire    Feeling of Stress : Only a little  Social Connections: Unknown (04/22/2023)   Social Connection and Isolation Panel [NHANES]    Frequency of Communication with Friends and Family: Once a week    Frequency of Social Gatherings with Friends and Family: Once a week    Attends Religious Services: Patient declined    Active Member of Clubs or Organizations: No    Attends Engineer, structural: Not on file    Marital Status: Never married  Intimate Partner Violence: Not At Risk (04/07/2019)   Humiliation, Afraid, Rape, and Kick questionnaire    Fear of Current or Ex-Partner: No    Emotionally Abused: No    Physically Abused: No    Sexually Abused: No   Past Surgical History:  Procedure Laterality Date   BIOPSY  05/09/2020   Procedure: BIOPSY;  Surgeon: Dolores Frame, MD;  Location: [facility];  Service: Gastroenterology;;   CHOLECYSTECTOMY N/A 05/15/2020   Procedure: LAPAROSCOPIC CHOLECYSTECTOMY;  Surgeon: Lucretia Roers, MD;  Location: [facility];  Service: General;  Laterality: N/A;   ESOPHAGOGASTRODUODENOSCOPY (EGD) WITH PROPOFOL N/A 05/09/2020   Procedure: ESOPHAGOGASTRODUODENOSCOPY (EGD) WITH PROPOFOL;  Surgeon:  Dolores Frame, MD;  Location: [facility];  Service: Gastroenterology;  Laterality: N/A;  AM   THYROIDECTOMY N/A 07/01/2017   Procedure: TOTAL THYROIDECTOMY;  Surgeon: Darnell Level, MD;  Location: [facility];  Service: General;  Laterality: N/A;   TONSILLECTOMY AND ADENOIDECTOMY Bilateral 01/2011   TOTAL THYROIDECTOMY     Dr. Gerrit Friends 07-01-17   Family History  Problem Relation Age of Onset   Hyperlipidemia Mother    Hypertension Mother    Diabetes Mother    Other Mother        gastropresis   Anxiety disorder Mother    Sleep apnea Mother    Other Father        syncope-has a pacemaker   COPD Father    Heart disease Father    Heart attack Maternal Grandmother    Heart Problems Maternal Grandfather    Heart attack Maternal Grandfather  Cancer Paternal Grandmother    Cancer Paternal Grandfather     Current Outpatient Medications:    busPIRone (BUSPAR) 5 MG tablet, Take 2 tablets (10 mg total) by mouth 3 (three) times daily., Disp: 180 tablet, Rfl: 4   escitalopram (LEXAPRO) 20 MG tablet, Take 1 tablet (20 mg total) by mouth at bedtime., Disp: 90 tablet, Rfl: 3   ferrous sulfate 324 (65 Fe) MG TBEC, Take 1 tablet (324 mg total) by mouth daily. (Patient taking differently: Take 324 mg by mouth at bedtime.), Disp: 90 tablet, Rfl: 3   furosemide (LASIX) 20 MG tablet, Take by mouth., Disp: , Rfl:    ibuprofen (ADVIL) 200 MG tablet, Take 400-600 mg by mouth every 6 (six) hours as needed for headache., Disp: , Rfl:    levothyroxine (SYNTHROID) 100 MCG tablet, Take 1 tablet (100 mcg total) by mouth daily before breakfast., Disp: 90 tablet, Rfl: 3   levothyroxine (SYNTHROID) 112 MCG tablet, Take 1 tablet (112 mcg total) by mouth daily., Disp: 90 tablet, Rfl: 3   metFORMIN (GLUCOPHAGE) 500 MG tablet, 1 full tablet at lunch, Disp: 30 tablet, Rfl: 0   norethindrone-ethinyl estradiol-FE (LOESTRIN FE) 1-20 MG-MCG tablet, Take 1 tablet by mouth daily., Disp: 84 tablet, Rfl: 4   omeprazole  (PRILOSEC) 20 MG capsule, Take 1 capsule (20 mg total) by mouth daily., Disp: 90 capsule, Rfl: 3   ondansetron (ZOFRAN) 4 MG tablet, Take 1 tablet (4 mg total) by mouth every 8 (eight) hours as needed for nausea or vomiting., Disp: 30 tablet, Rfl: 0   SF 5000 PLUS 1.1 % CREA dental cream, Take by mouth every morning., Disp: , Rfl:    sodium bicarbonate 650 MG tablet, Take 650 mg by mouth 3 (three) times daily., Disp: , Rfl:    topiramate (TOPAMAX) 50 MG tablet, Take 1 tablet (50 mg total) by mouth 2 (two) times daily., Disp: 60 tablet, Rfl: 0   Vitamin D, Ergocalciferol, (DRISDOL) 1.25 MG (50000 UNIT) CAPS capsule, Take 1 capsule (50,000 Units total) by mouth every 3 (three) days., Disp: 16 capsule, Rfl: 0  No Known Allergies   ROS: Review of Systems Pertinent items noted in HPI and remainder of comprehensive ROS otherwise negative.    Physical exam BP 129/82   Pulse 85   Temp 98 F (36.7 C)   Ht 5\' 6"  (1.676 m)   Wt 272 lb (123.4 kg)   SpO2 98%   BMI 43.90 kg/m  General appearance: alert, cooperative, appears stated age, no distress, and morbidly obese Head: Normocephalic, without obvious abnormality, atraumatic Eyes: negative findings: lids and lashes normal, conjunctivae and sclerae normal, corneas clear, and pupils equal, round, reactive to light and accomodation Ears: normal TM's and external ear canals both ears Nose: Nares normal. Septum midline. Mucosa normal. No drainage or sinus tenderness. Throat: lips, mucosa, and tongue normal; teeth and gums normal Neck: no adenopathy, supple, symmetrical, trachea midline, and thyroid not enlarged, symmetric, no tenderness/mass/nodules Back: symmetric, no curvature. ROM normal. No CVA tenderness. Lungs: clear to auscultation bilaterally Breasts: Inspection negative, No nipple retraction or dimpling, No nipple discharge or bleeding, No axillary or supraclavicular adenopathy, Normal to palpation without dominant masses, few fibroadenomas  noted throughout the breast bilaterally Heart: regular rate and rhythm, S1, S2 normal, no murmur, click, rub or gallop Abdomen: soft, non-tender; bowel sounds normal; no masses,  no organomegaly Pelvic: cervix normal in appearance, external genitalia normal, no adnexal masses or tenderness, no cervical motion tenderness, rectovaginal septum normal, uterus  normal size, shape, and consistency, and scant, brownish discharge within the vaginal vault Extremities:  Warm, well-perfused.  Nonpitting edema present in both the hands and feet Pulses: 2+ and symmetric Skin: Skin color, texture, turgor normal. No rashes or lesions Lymph nodes: Cervical, supraclavicular, and axillary nodes normal. Neurologic: Grossly normal      04/23/2023   11:26 AM 10/23/2022    8:06 AM 08/15/2021    9:54 AM  Depression screen PHQ 2/9  Decreased Interest 1 2 2   Down, Depressed, Hopeless 0 1 1  PHQ - 2 Score 1 3 3   Altered sleeping 2 3 1   Tired, decreased energy 2 3 1   Change in appetite 0 2 1  Feeling bad or failure about yourself  0 1 1  Trouble concentrating 1 2 3   Moving slowly or fidgety/restless 0 1 0  Suicidal thoughts 0 0 0  PHQ-9 Score 6 15 10   Difficult doing work/chores Somewhat difficult Very difficult Not difficult at all      04/23/2023   11:26 AM 10/23/2022    8:07 AM 08/01/2021    4:04 PM 04/30/2021    3:12 PM  GAD 7 : Generalized Anxiety Score  Nervous, Anxious, on Edge 2 2 0 2  Control/stop worrying 1 2 1 2   Worry too much - different things 1 2 1 1   Trouble relaxing 1 2 0 1  Restless 0 2 0 1  Easily annoyed or irritable 2 3 2 2   Afraid - awful might happen 0 0 0 0  Total GAD 7 Score 7 13 4 9   Anxiety Difficulty Somewhat difficult Somewhat difficult Somewhat difficult Somewhat difficult   Recent Results (from the past 2160 hours)  Vitamin B12     Status: None   Collection Time: 04/09/23 10:37 AM  Result Value Ref Range   Vitamin B-12 238 232 - 1,245 pg/mL  VITAMIN D 25 Hydroxy (Vit-D  Deficiency, Fractures)     Status: None   Collection Time: 04/09/23 10:37 AM  Result Value Ref Range   Vit D, 25-Hydroxy 71.4 30.0 - 100.0 ng/mL    Comment: Vitamin D deficiency has been defined by the Institute of Medicine and an Endocrine Society practice guideline as a level of serum 25-OH vitamin D less than 20 ng/mL (1,2). The Endocrine Society went on to further define vitamin D insufficiency as a level between 21 and 29 ng/mL (2). 1. IOM (Institute of Medicine). 2010. Dietary reference    intakes for calcium and D. [place]: The    [employer]. 2. Holick MF, Binkley [place], Bischoff-Ferrari HA, et al.    Evaluation, treatment, and prevention of vitamin D    deficiency: an Endocrine Society clinical practice    guideline. JCEM. 2011 Jul; 96(7):1911-30.   Comprehensive metabolic panel with GFR     Status: Abnormal   Collection Time: 04/09/23 10:37 AM  Result Value Ref Range   Glucose 87 70 - 99 mg/dL   BUN 14 6 - 20 mg/dL   Creatinine, Ser 9.56 0.57 - 1.00 mg/dL   eGFR 92 >21 HY/QMV/7.84   BUN/Creatinine Ratio 16 9 - 23   Sodium 139 134 - 144 mmol/L   Potassium 4.1 3.5 - 5.2 mmol/L   Chloride 104 96 - 106 mmol/L   CO2 19 (L) 20 - 29 mmol/L   Calcium 9.1 8.7 - 10.2 mg/dL   Total Protein 6.9 6.0 - 8.5 g/dL   Albumin 4.1 4.0 - 5.0 g/dL   Globulin, Total  2.8 1.5 - 4.5 g/dL   Bilirubin Total 0.4 0.0 - 1.2 mg/dL   Alkaline Phosphatase 94 44 - 121 IU/L   AST 13 0 - 40 IU/L   ALT 12 0 - 32 IU/L  Hemoglobin A1c     Status: None   Collection Time: 04/09/23 10:37 AM  Result Value Ref Range   Hgb A1c MFr Bld 5.3 4.8 - 5.6 %    Comment:          Prediabetes: 5.7 - 6.4          Diabetes: >6.4          Glycemic control for adults with diabetes: <7.0    Est. average glucose Bld gHb Est-mCnc 105 mg/dL  Insulin, random     Status: None   Collection Time: 04/09/23 10:37 AM  Result Value Ref Range   INSULIN 17.5 2.6 - 24.9 uIU/mL   Assessment/ Plan: Gaines Joy  here for annual physical exam.   Annual physical exam  Screening for malignant neoplasm of cervix - Plan: Cytology - PAP  Spotting between menses - Plan: norethindrone-ethinyl estradiol-iron (LOESTRIN FE 1.5/30) 1.5-30 MG-MCG tablet  Difficulty urinating - Plan: Ambulatory referral to Urology  Peripheral edema - Plan: furosemide (LASIX) 40 MG tablet  Pure hypercholesterolemia  Morbid obesity with BMI of 40.0-44.9, adult (HCC)  Hypothyroidism due to Hashimoto thyroiditis  Insulin resistance  Stage 2 chronic kidney disease  Vitamin D deficiency  Pap smear updated a few months early but having some breakthrough bleeding so I am going to change her birth control to a higher dose of hormone.  I have also placed referral to urology for ongoing issues with difficulty urinating.  Again may need to consider referral to GI to for fatty liver.  Continue weight loss as she has been  Her fasting labs were collected recently by her weight specialist of these were not repeated today.  Aside from fluid retention, she seems to be fairly euthymic.  Counseled on healthy lifestyle choices, including diet (rich in fruits, vegetables and lean meats and low in salt and simple carbohydrates) and exercise (at least 30 minutes of moderate physical activity daily).  Patient to follow up 3-37m for OCP recheck.  Tabbitha Janvrin M. Bonnell Butcher, DO

## 2023-04-25 LAB — CYTOLOGY - PAP
Chlamydia: NEGATIVE
Comment: NEGATIVE
Comment: NORMAL
Diagnosis: NEGATIVE
Neisseria Gonorrhea: NEGATIVE

## 2023-04-28 ENCOUNTER — Encounter: Payer: Self-pay | Admitting: Family Medicine

## 2023-05-07 ENCOUNTER — Telehealth: Payer: Self-pay | Admitting: Family Medicine

## 2023-05-07 ENCOUNTER — Encounter: Payer: Self-pay | Admitting: Family Medicine

## 2023-05-07 DIAGNOSIS — E063 Autoimmune thyroiditis: Secondary | ICD-10-CM

## 2023-05-07 NOTE — Telephone Encounter (Signed)
 Lmtcb requesting location preference for referral. LS

## 2023-05-07 NOTE — Telephone Encounter (Signed)
 Please see where she wants to go and I will place referral.

## 2023-05-07 NOTE — Telephone Encounter (Signed)
 Copied from CRM [id number]. Topic: Referral - Question >> May 06, 2023  2:31 PM [place] wrote: Reason for CRM: Need new endocrinologist due to insurance - please [phone]   Aaron Aas Please Review.

## 2023-05-16 ENCOUNTER — Other Ambulatory Visit (INDEPENDENT_AMBULATORY_CARE_PROVIDER_SITE_OTHER): Payer: Self-pay | Admitting: Adult Health

## 2023-05-16 DIAGNOSIS — F5089 Other specified eating disorder: Secondary | ICD-10-CM

## 2023-05-20 NOTE — Telephone Encounter (Signed)
 Refer to my chart message - referral has been placed

## 2023-05-21 ENCOUNTER — Ambulatory Visit (INDEPENDENT_AMBULATORY_CARE_PROVIDER_SITE_OTHER): Payer: Managed Care, Other (non HMO) | Admitting: Primary Care

## 2023-05-21 ENCOUNTER — Ambulatory Visit (INDEPENDENT_AMBULATORY_CARE_PROVIDER_SITE_OTHER): Admitting: Adult Health

## 2023-05-21 VITALS — BP 119/80 | HR 77 | Ht 66.0 in | Wt 274.0 lb

## 2023-05-21 DIAGNOSIS — E039 Hypothyroidism, unspecified: Secondary | ICD-10-CM | POA: Diagnosis not present

## 2023-05-21 DIAGNOSIS — F419 Anxiety disorder, unspecified: Secondary | ICD-10-CM

## 2023-05-21 DIAGNOSIS — G4730 Sleep apnea, unspecified: Secondary | ICD-10-CM

## 2023-05-21 DIAGNOSIS — G4733 Obstructive sleep apnea (adult) (pediatric): Secondary | ICD-10-CM

## 2023-05-21 NOTE — Progress Notes (Signed)
 @Patient  ID: Janet [name], female    DOB: 17-Mar-1995, 28 y.o.   MRN: [id number]  No chief complaint on file.   Referring provider: Florrie Husky, NP  HPI: 28 year old female, some day smoker. PMH significant for OSA on CPAP, GERD, hypothyroidism, depression, insomnia, obesity. Former patient of Dr. Gaynell Keeler, last seen in 2021.   Testing: HST 01/29/19 showed severe OSA, AHI 47/hour with SpO2 low 72%  Weigth 328lbs   05/21/2023- Interim hx  Discussed the use of AI scribe software for clinical note transcription with the patient, who gave verbal consent to proceed.  History of Present Illness   Janet [name] is a 28 year old female with severe sleep apnea who presents for a sleep consult. She was referred by Dr. Joen Muskrat for a sleep consult.  She has a history of severe sleep apnea diagnosed in January 2021, with a sleep study showing an average of 47 apneic events per hour and a lowest oxygen saturation of 72%. Initially treated with CPAP, she used it consistently but discontinued over a year ago due to discomfort and fear of the tubing wrapping around her neck during sleep. She experiences significant daytime fatigue, especially when sitting and working on the computer. No snoring, waking up gasping, or choking. She typically sleeps through the night but has excessive daytime sleepiness, scoring 14 out of 24 on the Epworth Sleepiness Scale.  She has lost approximately 50 pounds since her initial sleep study, attributing the weight loss to fluid reduction. Despite the weight loss, she continues to experience fatigue and daytime sleepiness. She has not used her CPAP for over a year.  Her past medical history includes hypothyroidism, for which she is on medication, and anxiety, for which she takes Buspar . She previously tried Lexapro  without success. Her vitamin D  levels have been low in the past, requiring supplementation, and her most recent level was normal. Her vitamin B12 and A1c levels are  also normal.  She works in a hospital setting, primarily doing computer work, and engages in regular walking for exercise, although the weather has recently limited this activity. She recently changed her birth control to a higher dose due to ineffectiveness of the previous one, and has not had a menstrual period this month.      No Known Allergies  Immunization History  Administered Date(s) Administered   DTaP 08/14/1994, 10/09/1994, 12/11/1994, 09/23/1995, 08/08/1999   HIB (PRP-OMP) 08/14/1994, 10/09/1994, 12/11/1994, 09/23/1995   HPV Quadrivalent 10/02/2005, 12/02/2005, 07/31/2009   Hepatitis B 09-30-94, 08/14/1994, 12/11/1994   IPV 08/14/1994, 10/09/1994, 12/11/1994, 08/08/1999   Influenza,inj,Quad PF,6+ Mos 10/26/2012, 01/21/2014, 11/25/2014, 10/16/2016, 10/03/2017, 08/31/2018, 11/22/2019, 11/14/2020   Influenza-Unspecified 08/31/2018, 10/09/2021, 10/12/2022   MMR 06/17/1995, 08/08/1999   Moderna Sars-Covid-2 Vaccination 05/24/2019, 06/21/2019, 01/25/2020   PNEUMOCOCCAL CONJUGATE-20 04/23/2023   Td 07/31/2009, 11/22/2019   Tdap 07/31/2009, 01/22/2016   Varicella 09/23/1995    Past Medical History:  Diagnosis Date   Allergy    Anemia    Anemia    Anxiety    Bilateral swelling of feet    Chronic kidney disease    stage 2 she says    Fatty liver    GERD (gastroesophageal reflux disease)    Headache(784.0)    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   Hypothyroidism    Kidney problem    Sleep apnea    Swallowing difficulty    Thyroid  disease    Hashimotos    Vitamin D  deficiency  Tobacco History: Social History   Tobacco Use  Smoking Status Some Days   Current packs/day: 0.50   Average packs/day: 0.5 packs/day for 2.0 years (1.0 ttl pk-yrs)   Types: Cigarettes  Smokeless Tobacco Never   Ready to quit: Not Answered Counseling given: Not Answered   Outpatient Medications Prior to Visit  Medication Sig Dispense Refill   busPIRone  (BUSPAR ) 5 MG  tablet Take 2 tablets (10 mg total) by mouth 3 (three) times daily. 180 tablet 4   escitalopram  (LEXAPRO ) 20 MG tablet Take 1 tablet (20 mg total) by mouth at bedtime. 90 tablet 3   ferrous sulfate  324 (65 Fe) MG TBEC Take 1 tablet (324 mg total) by mouth daily. 90 tablet 3   furosemide  (LASIX ) 40 MG tablet Take 0.5-1 tablets (20-40 mg total) by mouth daily as needed for edema. 90 tablet 3   ibuprofen (ADVIL) 200 MG tablet Take 400-600 mg by mouth every 6 (six) hours as needed for headache.     levothyroxine  (SYNTHROID ) 100 MCG tablet Take 1 tablet (100 mcg total) by mouth daily before breakfast. 90 tablet 3   levothyroxine  (SYNTHROID ) 112 MCG tablet Take 1 tablet (112 mcg total) by mouth daily. 90 tablet 3   metFORMIN  (GLUCOPHAGE ) 500 MG tablet 1 full tablet at lunch 30 tablet 0   norethindrone-ethinyl estradiol-iron (LOESTRIN FE 1.5/30) 1.5-30 MG-MCG tablet Take 1 tablet by mouth daily. **dose change 84 tablet 4   omeprazole  (PRILOSEC) 20 MG capsule Take 1 capsule (20 mg total) by mouth daily. 90 capsule 3   ondansetron  (ZOFRAN ) 4 MG tablet Take 1 tablet (4 mg total) by mouth every 8 (eight) hours as needed for nausea or vomiting. 30 tablet 0   SF 5000 PLUS 1.1 % CREA dental cream Take by mouth every morning.     sodium bicarbonate 650 MG tablet Take 650 mg by mouth 3 (three) times daily.     topiramate  (TOPAMAX ) 50 MG tablet Take 1 tablet (50 mg total) by mouth 2 (two) times daily. 60 tablet 0   Vitamin D , Ergocalciferol , (DRISDOL ) 1.25 MG (50000 UNIT) CAPS capsule Take 1 capsule (50,000 Units total) by mouth every 3 (three) days. 16 capsule 0   No facility-administered medications prior to visit.    Review of Systems  Review of Systems  Constitutional:  Positive for fatigue.  HENT: Negative.    Respiratory: Negative.    Cardiovascular: Negative.    Physical Exam  There were no vitals taken for this visit. Physical Exam Constitutional:      General: She is not in acute distress.     Appearance: Normal appearance. She is obese. She is not ill-appearing.  HENT:     Head: Normocephalic and atraumatic.  Cardiovascular:     Rate and Rhythm: Normal rate and regular rhythm.  Pulmonary:     Effort: Pulmonary effort is normal.     Breath sounds: Normal breath sounds.  Musculoskeletal:        General: Normal range of motion.  Skin:    General: Skin is warm and dry.  Neurological:     General: No focal deficit present.     Mental Status: She is alert and oriented to person, place, and time. Mental status is at baseline.  Psychiatric:        Mood and Affect: Mood normal.        Behavior: Behavior normal.        Thought Content: Thought content normal.  Judgment: Judgment normal.      Lab Results:  CBC    Component Value Date/Time   WBC 9.4 02/13/2022 0929   RBC 4.26 02/13/2022 0929   HGB 12.7 02/13/2022 0929   HGB 13.2 07/27/2021 1402   HCT 37.3 02/13/2022 0929   HCT 39.1 07/27/2021 1402   PLT 338 02/13/2022 0929   PLT 323 07/27/2021 1402   MCV 87.6 02/13/2022 0929   MCV 90 07/27/2021 1402   MCH 29.8 02/13/2022 0929   MCHC 34.0 02/13/2022 0929   RDW 12.4 02/13/2022 0929   RDW 12.0 07/27/2021 1402   LYMPHSABS 2.9 02/13/2022 0929   LYMPHSABS 3.6 (H) 07/27/2021 1402   MONOABS 0.5 02/13/2022 0929   EOSABS 0.1 02/13/2022 0929   EOSABS 0.1 07/27/2021 1402   BASOSABS 0.0 02/13/2022 0929   BASOSABS 0.1 07/27/2021 1402    BMET    Component Value Date/Time   NA 139 04/09/2023 1037   K 4.1 04/09/2023 1037   CL 104 04/09/2023 1037   CO2 19 (L) 04/09/2023 1037   GLUCOSE 87 04/09/2023 1037   GLUCOSE 97 02/13/2022 0929   BUN 14 04/09/2023 1037   CREATININE 0.88 04/09/2023 1037   CALCIUM  9.1 04/09/2023 1037   GFRNONAA >60 02/13/2022 0929   GFRAA 85 12/20/2019 1154    BNP    Component Value Date/Time   BNP 24.0 12/15/2018 1307    ProBNP No results found for: "PROBNP"  Imaging: No results found.   Assessment & Plan:   1. Severe sleep  apnea (Primary) - Home sleep test; Future  Assessment and Plan    Severe Obstructive Sleep Apnea Severe obstructive sleep apnea diagnosed in January 2021 with 47 apneic events per hour and oxygen desaturation to 72%. Symptoms include excessive daytime sleepiness and fatigue, particularly during sedentary activities. She initially improved with CPAP but discontinued due to mask discomfort. A 50-pound weight loss since diagnosis may have improved her condition. Sleep apnea increases risk for cardiac arrhythmias, stroke, diabetes, pulmonary hypertension, and possibly Alzheimer's. Fatigue is likely contributed by sleep apnea, though other causes are considered. - Order home sleep study to reassess severity of sleep apnea. - If sleep study shows moderate or severe sleep apnea, recommend retrying CPAP with a different mask, such as the [employer] full face mask. - Discuss alternative treatments if CPAP is not tolerated, including oral appliance therapy and potential referral for Inspire device evaluation if criteria are met. - Consider weight loss strategies, including potential referral to primary care for Zepbound  prescription.  Anxiety Anxiety managed with Buspar . Lexapro  was previously ineffective.  Hypothyroidism Hypothyroidism with recent thyroid  levels reported as normal.  Follow-up Plan to review sleep study results and discuss treatment options based on findings. - Review sleep study results in 2-3 weeks after completion and discuss treatment options based on findings.   Antonio Baumgarten, NP 05/21/2023

## 2023-05-21 NOTE — Patient Instructions (Addendum)
  VISIT SUMMARY: Today, you had a consultation to address your severe sleep apnea. You have a history of severe sleep apnea diagnosed in January 2021, and you have experienced significant daytime fatigue and sleepiness. You have not used your CPAP machine for over a year due to discomfort. Despite losing 50 pounds, you continue to experience fatigue. We discussed your current symptoms and potential treatment options.  YOUR PLAN: -SEVERE OBSTRUCTIVE SLEEP APNEA: Severe obstructive sleep apnea is a condition where your airway becomes blocked during sleep, causing breathing pauses and low oxygen levels. We will order a home sleep study to reassess the severity of your sleep apnea. If the study shows moderate or severe sleep apnea, we recommend retrying CPAP with a different mask, such as the [employer] full face mask. If CPAP is not tolerated, we will discuss alternative treatments, including oral appliance therapy and a potential referral for an Inspire device evaluation if you meet the criteria. We will also consider weight loss strategies and may refer you to your primary care doctor for a Zepbound  prescription.  -ANXIETY: Your anxiety is currently managed with Buspar . We noted that Lexapro  was previously ineffective for you.  -HYPOTHYROIDISM: Your hypothyroidism is being managed with medication, and your recent thyroid  levels are normal.  INSTRUCTIONS: We will review your sleep study results in 2-3 weeks after you complete it and discuss the treatment options based on the findings.  Follow-up 3 months with Beth NP or sooner if needed

## 2023-05-22 ENCOUNTER — Encounter (INDEPENDENT_AMBULATORY_CARE_PROVIDER_SITE_OTHER): Payer: Self-pay | Admitting: Adult Health

## 2023-05-22 ENCOUNTER — Ambulatory Visit (INDEPENDENT_AMBULATORY_CARE_PROVIDER_SITE_OTHER): Admitting: Adult Health

## 2023-05-22 VITALS — BP 122/84 | HR 83 | Temp 97.8°F | Ht 66.0 in | Wt 265.0 lb

## 2023-05-22 DIAGNOSIS — R600 Localized edema: Secondary | ICD-10-CM

## 2023-05-22 DIAGNOSIS — K760 Fatty (change of) liver, not elsewhere classified: Secondary | ICD-10-CM

## 2023-05-22 DIAGNOSIS — E559 Vitamin D deficiency, unspecified: Secondary | ICD-10-CM

## 2023-05-22 DIAGNOSIS — N182 Chronic kidney disease, stage 2 (mild): Secondary | ICD-10-CM | POA: Diagnosis not present

## 2023-05-22 DIAGNOSIS — F5089 Other specified eating disorder: Secondary | ICD-10-CM

## 2023-05-22 DIAGNOSIS — Z6841 Body Mass Index (BMI) 40.0 and over, adult: Secondary | ICD-10-CM

## 2023-05-22 DIAGNOSIS — E88819 Insulin resistance, unspecified: Secondary | ICD-10-CM

## 2023-05-22 MED ORDER — VITAMIN D (ERGOCALCIFEROL) 1.25 MG (50000 UNIT) PO CAPS: 50000.0000 [IU] | ORAL_CAPSULE | ORAL | 0 refills | Status: DC

## 2023-05-22 MED ORDER — TOPIRAMATE 50 MG PO TABS: 50.0000 mg | ORAL_TABLET | Freq: Two times a day (BID) | ORAL | 0 refills | Status: DC

## 2023-05-22 MED ORDER — CYANOCOBALAMIN 500 MCG PO TABS: 500.0000 ug | ORAL_TABLET | Freq: Every day | ORAL | 0 refills | Status: DC

## 2023-05-22 NOTE — Progress Notes (Addendum)
 WEIGHT SUMMARY AND BIOMETRICS  Vitals Temp: 97.8 F (36.6 C) BP: 122/84 Pulse Rate: 83 SpO2: 97 %   Anthropometric Measurements Height: 5' 6 (1.676 m) Weight: 265 lb (120.2 kg) BMI (Calculated): 42.79 Weight at Last Visit: 268 lb Weight Lost Since Last Visit: 3 lb Weight Gained Since Last Visit: 0 lb Starting Weight: 284 lb Total Weight Loss (lbs): 19 lb (8.618 kg)   Body Composition  Body Fat %: 49.2 % Fat Mass (lbs): 130.4 lbs Muscle Mass (lbs): 127.8 lbs Total Body Water  (lbs): 98.6 lbs Visceral Fat Rating : 13   Other Clinical Data Fasting: no Labs: no Today's Visit #: 20 Starting Date: 08/15/21    Chief Complaint:   OBESITY Janet [name] is here to discuss her progress with her obesity treatment plan.  She is on the the Category 3 Plan and states she is following her eating plan approximately 70 % of the time.  She states she is exercising Walking 30 minutes 3-4 times per week.  Interim History:  PCP visit on 04/23/2023- Referred to Urology  Referred to Endocrinology Lasix  adjusted, now taking daily 40mg  Oral birth control adjusted to treat breakthrough bleeding- 3rd week on new Rx and tolerating well  Her mother is at [facility] during OV  Her birthday is next month and she is looking forward to a week off work!  Subjective:   1. Peripheral edema Discussed Labs   Latest Reference Range & Units 04/09/23 10:37  Creatinine 0.57 - 1.00 mg/dL 9.11  Calcium  8.7 - 10.2 mg/dL 9.1  BUN/Creatinine Ratio 9 - 23  16  eGFR >59 mL/min/1.73 92    Latest Reference Range & Units 04/09/23 10:37  Sodium 134 - 144 mmol/L 139  Potassium 3.5 - 5.2 mmol/L 4.1   Electrolytes and kidney fx both normal 04/23/2023 PCP recently adjusted Lasix  to 40mg  daily She feels that her bilateral lower extremity edema has reduced.  2. Vitamin D  deficiency Discussed Labs  Latest Reference Range & Units 04/09/23 10:37  Vitamin D , 25-Hydroxy 30.0 - 100.0 ng/mL 71.4       Vit D level  stable and at goal She is on Ergocalciferol  Q3D  3. Hepatic Steatosis CLINICAL DATA:  Right upper quadrant pain   EXAM: ULTRASOUND ABDOMEN LIMITED RIGHT UPPER QUADRANT   COMPARISON:  12/15/2018   FINDINGS: Gallbladder:   Interval development of multiple gallstones and sludge. No gallbladder wall thickening or pericholecystic fluid. Sonographic Murphy sign negative per technologist.   Common bile duct:   Diameter: 6 mm   Liver:   No focal lesion.   Diffusely increased parenchymal echogenicity.   Portal vein is patent on color Doppler imaging with normal direction of blood flow towards the liver.   Other: None.   IMPRESSION: 1. Cholelithiasis without additional sonographic evidence of acute cholecystitis. 2. Diffuse increased echogenicity of the hepatic parenchyma is a nonspecific indicator of hepatocellular dysfunction, most commonly steatosis.   4. Stage 2 chronic kidney disease Discussed Labs  Latest Reference Range & Units 04/09/23 10:37  Creatinine 0.57 - 1.00 mg/dL 9.11  Calcium  8.7 - 10.2 mg/dL 9.1  BUN/Creatinine Ratio 9 - 23  16  eGFR >59 mL/min/1.73 92   BP at goal at OV Kidney Fx stable  5. Insulin  resistance Discussed Labs Vitamin B12 232 - 1,245 pg/mL 238    Latest Reference Range & Units 04/09/23 10:37  eGFR >59 mL/min/1.73 92    Latest Reference Range & Units 04/09/23 10:37  Glucose 70 - 99 mg/dL  87  Hemoglobin A1C 4.8 - 5.6 % 5.3  Est. average glucose Bld gHb Est-mCnc mg/dL 894  INSULIN  2.6 - 24.9 uIU/mL 17.5   CBG and A1c both at goal Insulin  Level slightly worsened GFR at goal  B12 sub optimal- she is not on any supplementation She is on daily Metformin  500mg - denies GI upset  6. Disorder of Eating, EE PCP recently adjusted oral BCP- she reports resolution of spotting in between menses She is also abstinent She is on Topamax  50mg  BID - denies polyphagia at present She denies paresthesias or word finding  difficulties  Assessment/Plan:   1. Peripheral edema (Primary) Elevated legs when able Compression socks Lasix  per PCP  2. Vitamin D  deficiency Refill and DECREASE  Vitamin D , Ergocalciferol , (DRISDOL ) 1.25 MG (50000 UNIT) CAPS capsule Take 1 capsule (50,000 Units total) by mouth every 7 (seven) days. Dispense: 4 capsule, Refills: 0 ordered   3. Hepatic Steatosis Continue with weight loss efforts  Avoid Hepatotoxic substances Limit diuretics is able  4. Stage 2 chronic kidney disease Avoid Nephrotoxic substances  5. Insulin  resistance Start  cyanocobalamin  (VITAMIN B12) 500 MCG tablet Take 1 tablet (500 mcg total) by mouth daily. Dispense: 90 tablet, Refills: 0 ordered  Continue daily Metforming 500mg   6. Disorder of Eating, EE Refill  topiramate  (TOPAMAX ) 50 MG tablet Take 1 tablet (50 mg total) by mouth 2 (two) times daily. Dispense: 60 tablet, Refills: 0 ordered   7. Morbid obesity (HCC), Current BMI 42.8  Janet [name] is currently in the action stage of change. As such, her goal is to continue with weight loss efforts. She has agreed to the Category 3 Plan.   Exercise goals: For substantial health benefits, adults should do at least 150 minutes (2 hours and 30 minutes) a week of moderate-intensity, or 75 minutes (1 hour and 15 minutes) a week of vigorous-intensity aerobic physical activity, or an equivalent combination of moderate- and vigorous-intensity aerobic activity. Aerobic activity should be performed in episodes of at least 10 minutes, and preferably, it should be spread throughout the week.  Behavioral modification strategies: increasing lean protein intake, decreasing simple carbohydrates, increasing vegetables, increasing water  intake, decreasing eating out, no skipping meals, meal planning and cooking strategies, keeping healthy foods in the home, and planning for success.  Janet [name] has agreed to follow-up with our clinic in 4 weeks. She was informed of the importance  of frequent follow-up visits to maximize her success with intensive lifestyle modifications for her multiple health conditions.   Objective:   Blood pressure 122/84, pulse 83, temperature 97.8 F (36.6 C), height 5' 6 (1.676 m), weight 265 lb (120.2 kg), SpO2 97%. Body mass index is 42.77 kg/m.  General: Cooperative, alert, well developed, in no acute distress. HEENT: Conjunctivae and lids unremarkable. Cardiovascular: Regular rhythm.  Lungs: Normal work of breathing. Neurologic: No focal deficits.   Lab Results  Component Value Date   CREATININE 0.88 04/09/2023   BUN 14 04/09/2023   NA 139 04/09/2023   K 4.1 04/09/2023   CL 104 04/09/2023   CO2 19 (L) 04/09/2023   Lab Results  Component Value Date   ALT 12 04/09/2023   AST 13 04/09/2023   ALKPHOS 94 04/09/2023   BILITOT 0.4 04/09/2023   Lab Results  Component Value Date   HGBA1C 5.3 04/09/2023   HGBA1C 5.5 12/23/2022   HGBA1C 5.2 07/17/2022   HGBA1C 5.5 03/18/2022   HGBA1C 5.5 08/15/2021   Lab Results  Component Value Date   INSULIN  17.5  04/09/2023   INSULIN  15.1 12/23/2022   INSULIN  22.8 07/17/2022   INSULIN  13.8 08/15/2021   Lab Results  Component Value Date   TSH 0.983 12/04/2022   Lab Results  Component Value Date   CHOL 184 08/15/2021   HDL 36 (L) 08/15/2021   LDLCALC 122 (H) 08/15/2021   TRIG 147 08/15/2021   CHOLHDL 6.6 (H) 02/28/2021   Lab Results  Component Value Date   VD25OH 71.4 04/09/2023   VD25OH 31.1 12/23/2022   VD25OH 36.9 07/17/2022   Lab Results  Component Value Date   WBC 9.4 02/13/2022   HGB 12.7 02/13/2022   HCT 37.3 02/13/2022   MCV 87.6 02/13/2022   PLT 338 02/13/2022   Lab Results  Component Value Date   IRON 61 07/27/2021   TIBC 280 07/27/2021   FERRITIN 24 07/27/2021   Attestation Statements:   Reviewed by clinician on day of visit: allergies, medications, problem list, medical history, surgical history, family history, social history, and previous encounter  notes.  I have reviewed the above documentation for accuracy and completeness, and I agree with the above. -  Elridge Stemm d. Zarayah Lanting, NP-C

## 2023-06-09 ENCOUNTER — Ambulatory Visit: Payer: Managed Care, Other (non HMO) | Admitting: Nurse Practitioner

## 2023-06-12 DIAGNOSIS — K2100 Gastro-esophageal reflux disease with esophagitis, without bleeding: Secondary | ICD-10-CM | POA: Insufficient documentation

## 2023-06-12 NOTE — Progress Notes (Signed)
 Name: Janet [name] DOB: [birth year] MRN: [id number]  History of Present Illness: Janet [name] is a 28 y.o. female who presents today as a new patient at [facility]. All available relevant medical records have been reviewed.  Relevant History includes: 1. CKD stage 2. Followed by Nephrology (Dr. Carrolyn Clan).  She reports chief complaint of difficulty urinating. - Per PCP note on 04/23/2023: Taking Lasix  for edema; reports difficulty with urine output without the medication.  This is despite drinking quite a bit of water  during the day to try and stay hydrated.  Today: She reports urinary urgency and intermittent urinary stream. Occasional straining to void and sensations of incomplete emptying.  She denies increased urinary frequency, nocturia, incontinence, dysuria, or gross hematuria.  She denies history of recent or recurrent UTI. She reports history of kidney stone x1, which she passed spontaneously.   Reports constipation; reports typically having a bowel movement every 1-2 days. Also reports dry mouth.  She reports caffeine intake (3-4 caffeinated beverages per day on average).  Denies vaginal bulge sensation.   Medications: Current Outpatient Medications  Medication Sig Dispense Refill   busPIRone  (BUSPAR ) 5 MG tablet Take 2 tablets (10 mg total) by mouth 3 (three) times daily. 180 tablet 4   cyanocobalamin  (VITAMIN B12) 500 MCG tablet Take 1 tablet (500 mcg total) by mouth daily. 90 tablet 0   ferrous sulfate  324 (65 Fe) MG TBEC Take 1 tablet (324 mg total) by mouth daily. 90 tablet 3   furosemide  (LASIX ) 40 MG tablet Take 0.5-1 tablets (20-40 mg total) by mouth daily as needed for edema. 90 tablet 3   ibuprofen (ADVIL) 200 MG tablet Take 400-600 mg by mouth every 6 (six) hours as needed for headache.     levothyroxine  (SYNTHROID ) 100 MCG tablet Take 1 tablet (100 mcg total) by mouth daily before breakfast. 90 tablet 3   levothyroxine  (SYNTHROID ) 112 MCG  tablet Take 1 tablet (112 mcg total) by mouth daily. 90 tablet 3   metFORMIN  (GLUCOPHAGE ) 500 MG tablet 1 full tablet at lunch 30 tablet 0   norethindrone-ethinyl estradiol-iron (LOESTRIN FE 1.5/30) 1.5-30 MG-MCG tablet Take 1 tablet by mouth daily. **dose change 84 tablet 4   omeprazole  (PRILOSEC) 20 MG capsule Take 1 capsule (20 mg total) by mouth daily. 90 capsule 3   ondansetron  (ZOFRAN ) 4 MG tablet Take 1 tablet (4 mg total) by mouth every 8 (eight) hours as needed for nausea or vomiting. 30 tablet 0   SF 5000 PLUS 1.1 % CREA dental cream Take by mouth every morning.     sodium bicarbonate 650 MG tablet Take 650 mg by mouth 3 (three) times daily.     topiramate  (TOPAMAX ) 50 MG tablet Take 1 tablet (50 mg total) by mouth 2 (two) times daily. 60 tablet 0   Vitamin D , Ergocalciferol , (DRISDOL ) 1.25 MG (50000 UNIT) CAPS capsule Take 1 capsule (50,000 Units total) by mouth every 7 (seven) days. 4 capsule 0   No current facility-administered medications for this visit.    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   Hypothyroidism  Kidney problem    Sleep apnea    Swallowing difficulty    Thyroid  disease    Hashimotos    Vitamin D  deficiency    Past Surgical History:  Procedure Laterality Date   BIOPSY  05/09/2020   Procedure: BIOPSY;  Surgeon: Urban Garden, MD;  Location: [facility];  Service: Gastroenterology;;   CHOLECYSTECTOMY N/A 05/15/2020   Procedure: LAPAROSCOPIC CHOLECYSTECTOMY;  Surgeon: Awilda Bogus, MD;  Location: [facility];  Service: General;  Laterality: N/A;   ESOPHAGOGASTRODUODENOSCOPY (EGD) WITH PROPOFOL  N/A 05/09/2020   Procedure: ESOPHAGOGASTRODUODENOSCOPY (EGD) WITH PROPOFOL ;   Surgeon: Urban Garden, MD;  Location: [facility];  Service: Gastroenterology;  Laterality: N/A;  AM   THYROIDECTOMY N/A 07/01/2017   Procedure: TOTAL THYROIDECTOMY;  Surgeon: Oralee Billow, MD;  Location: [facility];  Service: General;  Laterality: N/A;   TONSILLECTOMY AND ADENOIDECTOMY Bilateral 01/2011   TOTAL THYROIDECTOMY     Dr. Sofia Dunn 07-01-17   Family History  Problem Relation Age of Onset   Hyperlipidemia Mother    Hypertension Mother    Diabetes Mother    Other Mother        gastropresis   Anxiety disorder Mother    Sleep apnea Mother    Other Father        syncope-has a pacemaker   COPD Father    Heart disease Father    Heart attack Maternal Grandmother    Heart Problems Maternal Grandfather    Heart attack Maternal Grandfather    Cancer Paternal Grandmother    Cancer Paternal Grandfather    Social History   Socioeconomic History   Marital status: Single    Spouse name: Not on file   Number of children: Not on file   Years of education: Not on file   Highest education level: Associate degree: occupational, Scientist, product/process development, or vocational program  Occupational History   Occupation: Conservation officer, nature    Comment: Customer service   Occupation: Patient Registration  Tobacco Use   Smoking status: Some Days    Current packs/day: 0.50    Average packs/day: 0.5 packs/day for 2.0 years (1.0 ttl pk-yrs)    Types: Cigarettes   Smokeless tobacco: Never  Vaping Use   Vaping status: Never Used  Substance and Sexual Activity   Alcohol use: Not Currently    Comment: rare   Drug use: No   Sexual activity: Not Currently    Birth control/protection: None, Pill  Other Topics Concern   Not on file  Social History Narrative   Not on file   Social Drivers of Health   Financial Resource Strain: Low Risk  (06/04/2023)   Received from [facility]   Overall Financial Resource Strain (CARDIA)    Difficulty of Paying Living Expenses: Not very hard  Food Insecurity: No Food  Insecurity (06/04/2023)   [facility]   Hunger Vital Sign    Within the past 12 months, you worried that your food would run out before you got the money to buy more.: Never true    Within the past 12 months, the food you bought just didn't last and you didn't have money to get more.: Never true  Transportation Needs: No Transportation Needs (06/04/2023)   Received from [facility] - Transportation    Lack of Transportation (Medical): No    Lack of Transportation (Non-Medical): No  Physical Activity: Insufficiently Active (06/04/2023)   Received from [facility]   Exercise Vital Sign  On average, how many days per week do you engage in moderate to strenuous exercise (like a brisk walk)?: 2 days    On average, how many minutes do you engage in exercise at this level?: 30 min  Stress: No Stress Concern Present (06/04/2023)   Received from [facility] - Occupational Stress Questionnaire    Feeling of Stress : Only a little  Social Connections: Moderately Integrated (06/04/2023)   Received from [facility]   Social Network    How would you rate your social network (family, work, friends)?: Adequate participation with social networks  Intimate Partner Violence: Not At Risk (06/04/2023)   Received from [facility]   HITS    Over the last 12 months how often did your partner physically hurt you?: Never    Over the last 12 months how often did your partner insult you or talk down to you?: Never    Over the last 12 months how often did your partner threaten you with physical harm?: Never    Over the last 12 months how often did your partner scream or curse at you?: Never    SUBJECTIVE  Review of Systems Constitutional: Patient denies any unintentional weight loss or change in strength lntegumentary: Patient denies any rashes or pruritus Cardiovascular: Patient denies chest pain or syncope Respiratory: Patient  denies shortness of breath Gastrointestinal: As per HPI Musculoskeletal: Patient denies muscle cramps or weakness Neurologic: Patient denies convulsions or seizures Allergic/Immunologic: Patient denies recent allergic reaction(s) Hematologic/Lymphatic: Patient denies bleeding tendencies Endocrine: Patient denies heat/cold intolerance  GU: As per HPI.  OBJECTIVE Vitals:   06/23/23 1349  BP: 125/72  Pulse: 79   There is no height or weight on file to calculate BMI.  Physical Examination Constitutional: No obvious distress; patient is non-toxic appearing  Cardiovascular: No visible lower extremity edema.  Respiratory: The patient does not have audible wheezing/stridor; respirations do not appear labored  Gastrointestinal: Abdomen non-distended Musculoskeletal: Normal ROM of UEs  Skin: No obvious rashes/open sores  Neurologic: CN 2-12 grossly intact Psychiatric: Answered questions appropriately with normal affect  Hematologic/Lymphatic/Immunologic: No obvious bruises or sites of spontaneous bleeding  UA: negative  PVR: 14 ml Uroflow: normal bell curve  ASSESSMENT Voiding dysfunction - Plan: Urinalysis, Routine w reflex microscopic, BLADDER SCAN AMB NON-IMAGING, PR COMPLEX UROFLOMETRY  No acute findings. We discussed possible intermittent voiding dysfunction related to constipation and/or pelvic floor muscle dysfunction. OAB likely due to diuretic use, edema, and caffeine intake. Discussed behavioral modifications. She elected to follow up on an as-needed basis. Pt verbalized understanding and agreement. All questions were answered.   PLAN Advised the following: Return if symptoms worsen or fail to improve.  Orders Placed This Encounter  Procedures   Urinalysis, Routine w reflex microscopic   PR COMPLEX UROFLOMETRY   BLADDER SCAN AMB NON-IMAGING    It has been explained that the patient is to follow regularly with their PCP in addition to all other providers involved in  their care and to follow instructions provided by these respective offices. Patient advised to contact urology clinic if any urologic-pertaining questions, concerns, new symptoms or problems arise in the interim period.  There are no Patient Instructions on file for this visit.  Electronically signed by:  Lauretta Ponto, MSN, FNP-C, CUNP 06/23/2023 2:25 PM

## 2023-06-23 ENCOUNTER — Ambulatory Visit (INDEPENDENT_AMBULATORY_CARE_PROVIDER_SITE_OTHER): Admitting: Urology

## 2023-06-23 ENCOUNTER — Encounter: Payer: Self-pay | Admitting: Urology

## 2023-06-23 VITALS — BP 125/72 | HR 79

## 2023-06-23 DIAGNOSIS — R39198 Other difficulties with micturition: Secondary | ICD-10-CM

## 2023-06-23 DIAGNOSIS — N398 Other specified disorders of urinary system: Secondary | ICD-10-CM

## 2023-06-23 LAB — URINALYSIS, ROUTINE W REFLEX MICROSCOPIC
Bilirubin, UA: NEGATIVE
Glucose, UA: NEGATIVE
Ketones, UA: NEGATIVE
Leukocytes,UA: NEGATIVE
Nitrite, UA: NEGATIVE
Protein,UA: NEGATIVE
RBC, UA: NEGATIVE
Specific Gravity, UA: 1.01 (ref 1.005–1.030)
Urobilinogen, Ur: 0.2 mg/dL (ref 0.2–1.0)
pH, UA: 6 (ref 5.0–7.5)

## 2023-06-23 LAB — BLADDER SCAN AMB NON-IMAGING: Scan Result: 14

## 2023-06-23 NOTE — Progress Notes (Signed)
 Uroflowmetry order received. Patient voided own on with the following results:   Uroflow  Peak Flow: 62ml Average Flow: 13ml Voided Volume: Voiding Time: 76sec Flow Time: 32sec Time to Peak Flow: 8sec   Urine sent for Lab

## 2023-06-24 ENCOUNTER — Encounter: Payer: Self-pay | Admitting: Urology

## 2023-07-02 ENCOUNTER — Telehealth: Payer: Self-pay | Admitting: *Deleted

## 2023-07-02 NOTE — Telephone Encounter (Signed)
 Pharmacy  called to verify the correct dose of Levothyroxine . He has one prescription for 112 mcg, 100 mcg. Patient is to be taking the Levothyroxine  112 mcg per Whitney's note. Then, he has a prescription from Dr. Hosie from [facility] for 200 mcg.  Patient has not been seen here since 12-2022. Patient was called and a message was left sharing with her about the call from the pharmacy. Ask that she call and let us  know if she was seeing Dr. Hosie.

## 2023-07-16 ENCOUNTER — Encounter (INDEPENDENT_AMBULATORY_CARE_PROVIDER_SITE_OTHER): Payer: Self-pay | Admitting: Adult Health

## 2023-07-16 ENCOUNTER — Telehealth: Payer: Self-pay | Admitting: Primary Care

## 2023-07-16 ENCOUNTER — Ambulatory Visit (INDEPENDENT_AMBULATORY_CARE_PROVIDER_SITE_OTHER): Admitting: Adult Health

## 2023-07-16 VITALS — BP 117/81 | HR 64 | Temp 97.5°F | Ht 66.0 in | Wt 264.0 lb

## 2023-07-16 DIAGNOSIS — E063 Autoimmune thyroiditis: Secondary | ICD-10-CM | POA: Diagnosis not present

## 2023-07-16 DIAGNOSIS — F5089 Other specified eating disorder: Secondary | ICD-10-CM

## 2023-07-16 DIAGNOSIS — E559 Vitamin D deficiency, unspecified: Secondary | ICD-10-CM

## 2023-07-16 DIAGNOSIS — Z6841 Body Mass Index (BMI) 40.0 and over, adult: Secondary | ICD-10-CM

## 2023-07-16 DIAGNOSIS — G4733 Obstructive sleep apnea (adult) (pediatric): Secondary | ICD-10-CM | POA: Diagnosis not present

## 2023-07-16 DIAGNOSIS — E88819 Insulin resistance, unspecified: Secondary | ICD-10-CM

## 2023-07-16 MED ORDER — VITAMIN D (ERGOCALCIFEROL) 1.25 MG (50000 UNIT) PO CAPS
50000.0000 [IU] | ORAL_CAPSULE | ORAL | 0 refills | Status: DC
Start: 2023-07-16 — End: 2023-08-18

## 2023-07-16 MED ORDER — CYANOCOBALAMIN 500 MCG PO TABS: 500.0000 ug | ORAL_TABLET | Freq: Every day | ORAL | 0 refills | Status: DC

## 2023-07-16 MED ORDER — METFORMIN HCL 500 MG PO TABS: ORAL_TABLET | ORAL | 0 refills | Status: DC

## 2023-07-16 MED ORDER — TIRZEPATIDE-WEIGHT MANAGEMENT 2.5 MG/0.5ML ~~LOC~~ SOLN: 2.5000 mg | SUBCUTANEOUS | 0 refills | Status: DC

## 2023-07-16 NOTE — Progress Notes (Signed)
 WEIGHT SUMMARY AND BIOMETRICS  Vitals Temp: (!) 97.5 F (36.4 C) BP: 117/81 Pulse Rate: 64 SpO2: 100 %   Anthropometric Measurements Height: 5' 6 (1.676 m) Weight: 264 lb (119.7 kg) BMI (Calculated): 42.63 Weight at Last Visit: 265 lb Weight Lost Since Last Visit: 1 lb Weight Gained Since Last Visit: 0 Starting Weight: 284 lb Total Weight Loss (lbs): 20 lb (9.072 kg)   Body Composition  Body Fat %: 51.4 % Fat Mass (lbs): 136.2 lbs Muscle Mass (lbs): 122.2 lbs Visceral Fat Rating : 14   Other Clinical Data Fasting: no Labs: no Today's Visit #: 21 lb Starting Date: 08/15/21    Chief Complaint:   OBESITY Janet [name] is here to discuss her progress with her obesity treatment plan.  She is on the the Category 3 Plan and states she is following her eating plan approximately 70 % of the time.  She states she is exercising Walking 30 minutes 3-4 times per week.  Interim History:  Since her last OV with [facility]- She had OV with Endocrinology 06/01/2023 She had OV with Urology 06/23/2023  She has continued daily Lasix  40mg , she never takes 1/2 tab due to her persistent lower ext edema She denies dyspnea or orthopnea  Of note- Mother Janet [name]  at [facility] during OV  Subjective:   1. OSA on CPAP She denies family hx of MENS 2 or MTC She denies personal hx of pancreaitits Birth Control-  100% compliance with daily Loestrin FE and she is also abstinent  Home Sleep Study completed 01/29/2019 and 05/21/2023 She estimates to use CPAP 4 or 5 nights per week She denise napping in afternoon  2. Hypothyroidism due to Hashimoto thyroiditis 06/04/2023 OV with Endocrinology  HISTORY OF PRESENT ILLNESS: This is a 28 y.o. female with a history of Hashimoto's thyroiditis, morbid obesity, sleep apnea, GERD, and vitamin D  deficiency, who is seen in consultation at the request of Dr. Jolinda for evaluation of postsurgical hypothyroidism. She has been seeing a provider in the [facility]  endocrinology practice in [place] for the last couple years, but reports that they are now out of network with her insurance, which is why she is transferring care.   She was diagnosed with Hashimoto's thyroiditis as a teenager, and reportedly had a very large goiter with associated compressive symptoms. She subsequently underwent total thyroidectomy in 2019, performed by Dr. [name] in [place]. Surgical pathology was benign. She has been on thyroid  hormone replacement since surgery, but has had erratic TFTs in the past. She was severely hypothyroid (TSH around 300, with undetectable T4) on a couple of occasions in 2022 and 2023. Her TFTs have improved over the last year or two, and she most recently was euthyroid on labs in November 2024.  She is currently taking generic levothyroxine  212 mcg daily. (She takes a 100 mcg tablet and a 112 mcg tablet every morning.) She reports that she has not been missing any doses recently. She takes her levothyroxine  every morning on an empty stomach with just water  to drink. She takes a PPI later in the day.  She has been feeling well overall lately, and denies any issues with fatigue, cold intolerance, or constipation. She reports that she gained a lot of weight in the few years after her thyroidectomy, but has been gradually losing weight during the last few years. She has lost about 15 lb total in the last year. She is working with a Armed forces technical officer program. Was just started on low-dose  metformin  because of hyperinsulinemia. (She has never been diagnosed with diabetes.) She has been having more regular menstrual cycle since her OCP was changed to a different formulation earlier this year. She does complain of generalized swelling, and has been taking Lasix  40 mg daily. Impression Encounter Diagnosis  Name Primary?  Postsurgical hypothyroidism Yes   This is a 28 y.o. female with postsurgical hypothyroidism, following total thyroidectomy in 2019 (which  was done because of a large goiter associated with Hashimoto's thyroiditis, with associated compressive symptoms). She has been on generic levothyroxine  replacement. She has had erratic TSH results in the past, but generally much better over the last year or two. She is currently taking levothyroxine  212 mcg daily. (She takes a 100 mcg tablet and a 112 mcg tablet every morning.) We will update TFTs today. She is trying to lose more weight, and working with a weight management program in the [facility] health system.  Recommendations 1) Will check TSH+FT4 today, and adjust levothyroxine  dosage if necessary. 2) Follow up as directed with [facility] weight management program. 3) Return for follow-up with me in 6 months.   3. Vitamin D  deficiency  Latest Reference Range & Units 07/17/22 10:44 12/23/22 10:47 04/09/23 10:37  Vitamin D , 25-Hydroxy 30.0 - 100.0 ng/mL 36.9 31.1 71.4   She is on weekly Ergocalciferol - denies N/V/Muscle Weakness  4. Insulin  resistance  Latest Reference Range & Units 07/17/22 10:44 12/23/22 10:47 04/09/23 10:37  INSULIN  2.6 - 24.9 uIU/mL 22.8 15.1 17.5   She is taking Metformin  500mg  daily with her lunch meal She denies GI upset  Of note- CKD stage 2. Followed by Nephrology (Dr. [name]).   5. Other disorder of eating, emotional eating BP excellent at OV She denies hx of seizure d/o She is on oral birth control- she is 100% complaint and also abstinent She is taking nightly Topamax  50mg  - 2 tabs She denies paresthesias or word finding problems  Assessment/Plan:   1. OSA on CPAP Start tirzepatide  (ZEPBOUND ) 2.5 MG/0.5ML injection vial Inject 2.5 mg into the skin once a week. Dispense: 2 mL, Refills: 0 ordered   2. Hypothyroidism due to Hashimoto thyroiditis Continue current Levothyroxine  therapy per Endo  3. Vitamin D  deficiency (Primary) Refill  Vitamin D , Ergocalciferol , (DRISDOL ) 1.25 MG (50000 UNIT) CAPS capsule Take 1 capsule (50,000 Units total) by mouth every  7 (seven) days. Dispense: 4 capsule, Refills: 0 ordered   4. Insulin  resistance Refill cyanocobalamin  (VITAMIN B12) 500 MCG tablet Take 1 tablet (500 mcg total) by mouth daily. Dispense: 90 tablet, Refills: 0 ordered  Refill metFORMIN  (GLUCOPHAGE ) 500 MG tablet 1 full tablet at lunch Dispense: 30 tablet, Refills: 0 ordered   5. Other disorder of eating, emotional eating Refill  topiramate  (TOPAMAX ) 50 MG tablet Take 1 tablet (50 mg total) by mouth 2 (two) times daily. Dispense: 60 tablet, Refills: 0 ordered   6. Morbid obesity (HCC), Current BMI 42.7 Start tirzepatide  (ZEPBOUND ) 2.5 MG/0.5ML injection vial Inject 2.5 mg into the skin once a week. Dispense: 2 mL, Refills: 0 ordered   Janet [name] is currently in the action stage of change. As such, her goal is to continue with weight loss efforts. She has agreed to the Category 3 Plan.   Exercise goals: All adults should avoid inactivity. Some physical activity is better than none, and adults who participate in any amount of physical activity gain some health benefits. Adults should also include muscle-strengthening activities that involve all major muscle groups on 2 or more days a week.  Behavioral modification strategies: increasing lean protein intake, decreasing simple carbohydrates, increasing vegetables, increasing water  intake, no skipping meals, meal planning and cooking strategies, keeping healthy foods in the home, ways to avoid boredom eating, and planning for success.  Janet [name] has agreed to follow-up with our clinic in 4 weeks. She was informed of the importance of frequent follow-up visits to maximize her success with intensive lifestyle modifications for her multiple health conditions.   Objective:   Blood pressure 117/81, pulse 64, temperature (!) 97.5 F (36.4 C), height 5' 6 (1.676 m), weight 264 lb (119.7 kg), SpO2 100%. Body mass index is 42.61 kg/m.  General: Cooperative, alert, well developed, in no acute  distress. HEENT: Conjunctivae and lids unremarkable. Cardiovascular: Regular rhythm.  Lungs: Normal work of breathing. Neurologic: No focal deficits.   Lab Results  Component Value Date   CREATININE 0.88 04/09/2023   BUN 14 04/09/2023   NA 139 04/09/2023   K 4.1 04/09/2023   CL 104 04/09/2023   CO2 19 (L) 04/09/2023   Lab Results  Component Value Date   ALT 12 04/09/2023   AST 13 04/09/2023   ALKPHOS 94 04/09/2023   BILITOT 0.4 04/09/2023   Lab Results  Component Value Date   HGBA1C 5.3 04/09/2023   HGBA1C 5.5 12/23/2022   HGBA1C 5.2 07/17/2022   HGBA1C 5.5 03/18/2022   HGBA1C 5.5 08/15/2021   Lab Results  Component Value Date   INSULIN  17.5 04/09/2023   INSULIN  15.1 12/23/2022   INSULIN  22.8 07/17/2022   INSULIN  13.8 08/15/2021   Lab Results  Component Value Date   TSH 0.983 12/04/2022   Lab Results  Component Value Date   CHOL 184 08/15/2021   HDL 36 (L) 08/15/2021   LDLCALC 122 (H) 08/15/2021   TRIG 147 08/15/2021   CHOLHDL 6.6 (H) 02/28/2021   Lab Results  Component Value Date   VD25OH 71.4 04/09/2023   VD25OH 31.1 12/23/2022   VD25OH 36.9 07/17/2022   Lab Results  Component Value Date   WBC 9.4 02/13/2022   HGB 12.7 02/13/2022   HCT 37.3 02/13/2022   MCV 87.6 02/13/2022   PLT 338 02/13/2022   Lab Results  Component Value Date   IRON 61 07/27/2021   TIBC 280 07/27/2021   FERRITIN 24 07/27/2021   Attestation Statements:   Reviewed by clinician on day of visit: allergies, medications, problem list, medical history, surgical history, family history, social history, and previous encounter notes.  I have reviewed the above documentation for accuracy and completeness, and I agree with the above. -  Ladona Rosten d. Airabella Barley, NP-C

## 2023-07-16 NOTE — Telephone Encounter (Signed)
 LVM for patient to call and discuss scheduling the 3 month follow up with Almarie Ferrari, NP--will offer [address] or [address]

## 2023-07-17 ENCOUNTER — Encounter (INDEPENDENT_AMBULATORY_CARE_PROVIDER_SITE_OTHER): Payer: Self-pay | Admitting: Adult Health

## 2023-07-25 ENCOUNTER — Ambulatory Visit: Admitting: Family Medicine

## 2023-07-25 NOTE — Progress Notes (Deleted)
 Subjective: RR:denuupwh PCP: Jolinda Norene HERO, DO [name]:Janet [name] is a 28 y.o. female presenting to clinic today for:  1. Intramenstrual spotting OCP increased last OV. 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   Hypothyroidism    Kidney problem    Sleep apnea    Swallowing difficulty    Thyroid  disease    Hashimotos    Vitamin D  deficiency     Current Outpatient Medications:    busPIRone  (BUSPAR ) 5 MG tablet, Take 2 tablets (10 mg total) by mouth 3 (three) times daily., Disp: 180 tablet, Rfl: 4   cyanocobalamin  (VITAMIN B12) 500 MCG tablet, Take 1 tablet (500 mcg total) by mouth daily., Disp: 90 tablet, Rfl: 0   ferrous sulfate  324 (65 Fe) MG TBEC, Take 1 tablet (324 mg total) by mouth daily., Disp: 90 tablet, Rfl: 3   furosemide  (LASIX ) 40 MG tablet, Take 0.5-1 tablets (20-40 mg total) by mouth daily as needed for edema., Disp: 90 tablet, Rfl: 3   ibuprofen (ADVIL) 200 MG tablet, Take 400-600 mg by mouth every 6 (six) hours as needed for headache., Disp: , Rfl:    levothyroxine  (SYNTHROID ) 200 MCG tablet, Take 200 mcg by mouth every morning., Disp: , Rfl:    metFORMIN  (GLUCOPHAGE ) 500 MG tablet, 1 full tablet at lunch, Disp: 30 tablet, Rfl: 0   norethindrone-ethinyl estradiol-iron (LOESTRIN FE 1.5/30) 1.5-30 MG-MCG tablet, Take 1 tablet by mouth daily. **dose change, Disp: 84 tablet, Rfl: 4   omeprazole  (PRILOSEC) 20 MG capsule, Take 1 capsule (20 mg total) by mouth daily., Disp: 90 capsule, Rfl: 3   ondansetron  (ZOFRAN ) 4 MG tablet, Take 1 tablet (4 mg total) by mouth every 8 (eight) hours as needed for nausea or vomiting., Disp: 30 tablet, Rfl: 0   SF 5000 PLUS 1.1 % CREA dental cream,  Take by mouth every morning., Disp: , Rfl:    sodium bicarbonate 650 MG tablet, Take 650 mg by mouth 3 (three) times daily., Disp: , Rfl:    tirzepatide  (ZEPBOUND ) 2.5 MG/0.5ML injection vial, Inject 2.5 mg into the skin once a week., Disp: 2 mL, Rfl: 0   topiramate  (TOPAMAX ) 50 MG tablet, Take 1 tablet (50 mg total) by mouth 2 (two) times daily., Disp: 60 tablet, Rfl: 0   Vitamin D , Ergocalciferol , (DRISDOL ) 1.25 MG (50000 UNIT) CAPS capsule, Take 1 capsule (50,000 Units total) by mouth every 7 (seven) days., Disp: 4 capsule, Rfl: 0 Social History   Socioeconomic History   Marital status: Single    Spouse name: Not on file   Number of children: Not on file   Years of education: Not on file   Highest education level: Associate degree: occupational, Scientist, product/process development, or vocational program  Occupational History   Occupation: Conservation officer, nature    Comment: Customer service   Occupation: Patient Registration  Tobacco Use   Smoking status: Some Days    Current packs/day: 0.50    Average packs/day: 0.5 packs/day for 2.0 years (1.0 ttl pk-yrs)    Types: Cigarettes   Smokeless tobacco: Never  Vaping Use   Vaping status:  Never Used  Substance and Sexual Activity   Alcohol use: Not Currently    Comment: rare   Drug use: No   Sexual activity: Not Currently    Birth control/protection: None, Pill  Other Topics Concern   Not on file  Social History Narrative   Not on file   Social Drivers of Health   Financial Resource Strain: Low Risk  (06/04/2023)   Received [facility]   Overall Financial Resource Strain (CARDIA)    Difficulty of Paying Living Expenses: Not very hard  Food Insecurity: No Food Insecurity (06/04/2023)   Received from [facility]   Hunger Vital Sign    Within the past 12 months, you worried that your food would run out before you got the money to buy more.: Never true    Within the past 12 months, the food you bought just didn't last and you didn't have money to get more.:  Never true  Transportation Needs: No Transportation Needs (06/04/2023)   Received from [facility] - Transportation    Lack of Transportation (Medical): No    Lack of Transportation (Non-Medical): No  Physical Activity: Insufficiently Active (06/04/2023)   Received from [facility]   Exercise Vital Sign    On average, how many days per week do you engage in moderate to strenuous exercise (like a brisk walk)?: 2 days    On average, how many minutes do you engage in exercise at this level?: 30 min  Stress: No Stress Concern Present (06/04/2023)   Received from [facility] - Occupational Stress Questionnaire    Feeling of Stress : Only a little  Social Connections: Moderately Integrated (06/04/2023)   Received from [facility]   Social Network    How would you rate your social network (family, work, friends)?: Adequate participation with social networks  Intimate Partner Violence: Not At Risk (06/04/2023)   Received from [facility]   HITS    Over the last 12 months how often did your partner physically hurt you?: Never    Over the last 12 months how often did your partner insult you or talk down to you?: Never    Over the last 12 months how often did your partner threaten you with physical harm?: Never    Over the last 12 months how often did your partner scream or curse at you?: Never   Family History  Problem Relation Age of Onset   Hyperlipidemia Mother    Hypertension Mother    Diabetes Mother    Other Mother        gastropresis   Anxiety disorder Mother    Sleep apnea Mother    Other Father        syncope-has a pacemaker   COPD Father    Heart disease Father    Heart attack Maternal Grandmother    Heart Problems Maternal Grandfather    Heart attack Maternal Grandfather    Cancer Paternal Grandmother    Cancer Paternal Grandfather     Objective: Office vital signs reviewed. There were no vitals taken for this  visit.  Physical Examination:  General: Awake, alert, *** nourished, No acute distress HEENT: Normal    Neck: No masses palpated. No lymphadenopathy    Ears: Tympanic membranes intact, normal light reflex, no erythema, no bulging    Eyes: PERRLA, extraocular membranes intact, sclera ***    Nose: nasal turbinates moist, *** nasal discharge  Throat: moist mucus membranes, no erythema, *** tonsillar exudate.  Airway is patent Cardio: regular rate and rhythm, S1S2 heard, no murmurs appreciated Pulm: clear to auscultation bilaterally, no wheezes, rhonchi or rales; normal work of breathing on room air GI: soft, non-tender, non-distended, bowel sounds present x4, no hepatomegaly, no splenomegaly, no masses GU: external vaginal tissue ***, cervix ***, *** punctate lesions on cervix appreciated, *** discharge from cervical os, *** bleeding, *** cervical motion tenderness, *** abdominal/ adnexal masses Extremities: warm, well perfused, No edema, cyanosis or clubbing; +*** pulses bilaterally MSK: *** gait and *** station Skin: dry; intact; no rashes or lesions Neuro: *** Strength and light touch sensation grossly intact, *** DTRs ***/4  Assessment/ Plan: 28 y.o. female   Spotting between menses  Encounter for surveillance of contraceptive pills  ***   Barbee Mamula CHRISTELLA Fielding, DO [facility] [phone]

## 2023-08-13 ENCOUNTER — Ambulatory Visit (INDEPENDENT_AMBULATORY_CARE_PROVIDER_SITE_OTHER): Admitting: Adult Health

## 2023-08-13 ENCOUNTER — Ambulatory Visit (INDEPENDENT_AMBULATORY_CARE_PROVIDER_SITE_OTHER): Admitting: Family Medicine

## 2023-08-13 ENCOUNTER — Encounter: Payer: Self-pay | Admitting: Family Medicine

## 2023-08-13 VITALS — BP 127/86 | HR 81 | Temp 97.5°F | Ht 66.0 in | Wt 267.4 lb

## 2023-08-13 DIAGNOSIS — N920 Excessive and frequent menstruation with regular cycle: Secondary | ICD-10-CM

## 2023-08-13 DIAGNOSIS — E538 Deficiency of other specified B group vitamins: Secondary | ICD-10-CM

## 2023-08-13 DIAGNOSIS — R600 Localized edema: Secondary | ICD-10-CM | POA: Diagnosis not present

## 2023-08-13 DIAGNOSIS — F419 Anxiety disorder, unspecified: Secondary | ICD-10-CM

## 2023-08-13 DIAGNOSIS — Z3041 Encounter for surveillance of contraceptive pills: Secondary | ICD-10-CM

## 2023-08-13 MED ORDER — NORETHIN ACE-ETH ESTRAD-FE 1.5-30 MG-MCG PO TABS: 1.0000 | ORAL_TABLET | Freq: Every day | ORAL | 4 refills | Status: AC

## 2023-08-13 MED ORDER — BUSPIRONE HCL 5 MG PO TABS: 10.0000 mg | ORAL_TABLET | Freq: Three times a day (TID) | ORAL | 4 refills | Status: DC

## 2023-08-13 MED ORDER — CYANOCOBALAMIN 1000 MCG/ML IJ SOLN
1000.0000 ug | Freq: Once | INTRAMUSCULAR | Status: AC
  Administered 2023-08-13: 1000 ug via INTRAMUSCULAR

## 2023-08-13 NOTE — Addendum Note (Signed)
 Addended by: CLEATUS SARI RAMAN on: 08/13/2023 10:07 AM   Modules accepted: Orders

## 2023-08-13 NOTE — Patient Instructions (Signed)
 Since Vit D normal now, no need for prescription dosing. Go to 400IU over the counter daily.

## 2023-08-13 NOTE — Progress Notes (Signed)
 Subjective: CC: Intramenstrual bleeding PCP: Janet Norene HERO, DO [name]:Janet [name] is a 28 y.o. female presenting to clinic today for:  1.  Intermenstrual bleeding/obesity, vitamin D , swelling She reports resolution of this after increasing her OCP last visit.  She has been doing well and has had menstrual cycles when they are supposed to occur.  She is considering getting on Zepbound  for severe obstructive sleep apnea and morbid obesity but has been having some difficulty getting this through her current provider.  She thinks she may have to switch over to a [facility] specialist  Her last vitamin D  was within normal range.  However, her B12 was low end of normal and she does suffer from chronic fatigue.  Taking oral B12 but this does not seem to be helping.  Has a history of overt deficiency many years ago.  Continues to have swelling and is taking Lasix  as directed.  She has good urine output for a few hours but then after that she has no significant urine output.  She tries to fluid restrict because she just feels like she is adding fluid into her body but nothing is coming out.  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   Hypothyroidism    Kidney problem    Sleep apnea    Swallowing difficulty    Thyroid  disease    Hashimotos    Vitamin D  deficiency     Current Outpatient Medications:    busPIRone  (BUSPAR ) 5 MG tablet, Take 2 tablets (10 mg total) by mouth 3 (three) times daily., Disp: 180 tablet, Rfl: 4   cyanocobalamin  (VITAMIN B12) 500 MCG tablet, Take 1 tablet (500 mcg total) by mouth daily., Disp: 90 tablet, Rfl: 0   ferrous sulfate  324 (65 Fe) MG TBEC,  Take 1 tablet (324 mg total) by mouth daily., Disp: 90 tablet, Rfl: 3   furosemide  (LASIX ) 40 MG tablet, Take 0.5-1 tablets (20-40 mg total) by mouth daily as needed for edema., Disp: 90 tablet, Rfl: 3   ibuprofen (ADVIL) 200 MG tablet, Take 400-600 mg by mouth every 6 (six) hours as needed for headache., Disp: , Rfl:    levothyroxine  (SYNTHROID ) 200 MCG tablet, Take 200 mcg by mouth every morning., Disp: , Rfl:    metFORMIN  (GLUCOPHAGE ) 500 MG tablet, 1 full tablet at lunch, Disp: 30 tablet, Rfl: 0   norethindrone-ethinyl estradiol-iron (LOESTRIN FE 1.5/30) 1.5-30 MG-MCG tablet, Take 1 tablet by mouth daily. **dose change, Disp: 84 tablet, Rfl: 4   omeprazole  (PRILOSEC) 20 MG capsule, Take 1 capsule (20 mg total) by mouth daily., Disp: 90 capsule, Rfl: 3   ondansetron  (ZOFRAN ) 4 MG tablet, Take 1 tablet (4 mg total) by mouth every 8 (eight) hours as needed for nausea or vomiting., Disp: 30 tablet, Rfl: 0   SF 5000 PLUS 1.1 % CREA dental cream, Take by mouth every morning., Disp: , Rfl:    sodium bicarbonate 650 MG tablet, Take 650 mg by mouth 3 (three) times daily., Disp: , Rfl:    tirzepatide  (ZEPBOUND ) 2.5 MG/0.5ML  injection vial, Inject 2.5 mg into the skin once a week., Disp: 2 mL, Rfl: 0   topiramate  (TOPAMAX ) 50 MG tablet, Take 1 tablet (50 mg total) by mouth 2 (two) times daily., Disp: 60 tablet, Rfl: 0   Vitamin D , Ergocalciferol , (DRISDOL ) 1.25 MG (50000 UNIT) CAPS capsule, Take 1 capsule (50,000 Units total) by mouth every 7 (seven) days., Disp: 4 capsule, Rfl: 0 Social History   Socioeconomic History   Marital status: Single    Spouse name: Not on file   Number of children: Not on file   Years of education: Not on file   Highest education level: Associate degree: occupational, Scientist, product/process development, or vocational program  Occupational History   Occupation: Conservation officer, nature    Comment: Customer service   Occupation: Patient Registration  Tobacco Use   Smoking status: Some Days    Current packs/day:  0.50    Average packs/day: 0.5 packs/day for 2.0 years (1.0 ttl pk-yrs)    Types: Cigarettes   Smokeless tobacco: Never  Vaping Use   Vaping status: Never Used  Substance and Sexual Activity   Alcohol use: Not Currently    Comment: rare   Drug use: No   Sexual activity: Not Currently    Birth control/protection: None, Pill  Other Topics Concern   Not on file  Social History Narrative   Not on file   Social Drivers of Health   Financial Resource Strain: Low Risk  (06/04/2023)   Received from [facility]   Overall Financial Resource Strain (CARDIA)    Difficulty of Paying Living Expenses: Not very hard  Food Insecurity: No Food Insecurity (06/04/2023)   Received from [facility]   Hunger Vital Sign    Within the past 12 months, you worried that your food would run out before you got the money to buy more.: Never true    Within the past 12 months, the food you bought just didn't last and you didn't have money to get more.: Never true  Transportation Needs: No Transportation Needs (06/04/2023)   Received from [facility] - Transportation    Lack of Transportation (Medical): No    Lack of Transportation (Non-Medical): No  Physical Activity: Insufficiently Active (06/04/2023)   Received from [facility]   Exercise Vital Sign    On average, how many days per week do you engage in moderate to strenuous exercise (like a brisk walk)?: 2 days    On average, how many minutes do you engage in exercise at this level?: 30 min  Stress: No Stress Concern Present (06/04/2023)   Received from [facility] - Occupational Stress Questionnaire    Feeling of Stress : Only a little  Social Connections: Moderately Integrated (06/04/2023)   Received from [facility]   Social Network    How would you rate your social network (family, work, friends)?: Adequate participation with social networks  Intimate Partner Violence: Not At Risk  (06/04/2023)   Received from [facility]   HITS    Over the last 12 months how often did your partner physically hurt you?: Never    Over the last 12 months how often did your partner insult you or talk down to you?: Never    Over the last 12 months how often did your partner threaten you with physical harm?: Never    Over the last 12 months how often did your partner scream or curse at you?: Never   Family  History  Problem Relation Age of Onset   Hyperlipidemia Mother    Hypertension Mother    Diabetes Mother    Other Mother        gastropresis   Anxiety disorder Mother    Sleep apnea Mother    Other Father        syncope-has a pacemaker   COPD Father    Heart disease Father    Heart attack Maternal Grandmother    Heart Problems Maternal Grandfather    Heart attack Maternal Grandfather    Cancer Paternal Grandmother    Cancer Paternal Grandfather     Objective: Office vital signs reviewed. BP 127/86   Pulse 81   Temp (!) 97.5 F (36.4 C) (Temporal)   Ht 5' 6 (1.676 m)   Wt 267 lb 6.4 oz (121.3 kg)   LMP 07/23/2023   SpO2 95%   BMI 43.16 kg/m   Physical Examination:  General: Awake, alert, obesity, No acute distress HEENT: Exophthalmos present Cardio: regular rate and rhythm, S1S2 heard, no murmurs appreciated Pulm: clear to auscultation bilaterally, no wheezes, rhonchi or rales; normal work of breathing on room air     08/13/2023    9:14 AM 04/23/2023   11:26 AM 10/23/2022    8:06 AM  Depression screen PHQ 2/9  Decreased Interest 2 1 2   Down, Depressed, Hopeless 0 0 1  PHQ - 2 Score 2 1 3   Altered sleeping 2 2 3   Tired, decreased energy 2 2 3   Change in appetite 0 0 2  Feeling bad or failure about yourself  0 0 1  Trouble concentrating 0 1 2  Moving slowly or fidgety/restless 0 0 1  Suicidal thoughts 0 0 0  PHQ-9 Score 6 6 15   Difficult doing work/chores Somewhat difficult Somewhat difficult Very difficult      08/13/2023    9:15 AM 04/23/2023   11:26  AM 10/23/2022    8:07 AM 08/01/2021    4:04 PM  GAD 7 : Generalized Anxiety Score  Nervous, Anxious, on Edge 2 2 2  0  Control/stop worrying 2 1 2 1   Worry too much - different things 2 1 2 1   Trouble relaxing 2 1 2  0  Restless 0 0 2 0  Easily annoyed or irritable 3 2 3 2   Afraid - awful might happen 0 0 0 0  Total GAD 7 Score 11 7 13 4   Anxiety Difficulty Somewhat difficult Somewhat difficult Somewhat difficult Somewhat difficult     Assessment/ Plan: 28 y.o. female   Spotting between menses - Plan: norethindrone-ethinyl estradiol-iron (LOESTRIN FE 1.5/30) 1.5-30 MG-MCG tablet  Surveillance of contraceptive pill  Vitamin B12 deficiency  Peripheral edema - Plan: Hepatic Function Panel, Brain natriuretic peptide  Anxiety - Plan: busPIRone  (BUSPAR ) 5 MG tablet  Spotting between menses resolved with increased dose.  Medication renewed  B12 injection given for B12 deficiency  Check hepatic function panel, BMP given peripheral edema though this have been within normal range previously  Buspirone  need for as needed use   Rishard Delange CHRISTELLA Fielding, DO [facility] [phone]

## 2023-08-14 ENCOUNTER — Ambulatory Visit: Payer: Self-pay | Admitting: Family Medicine

## 2023-08-15 LAB — HEPATIC FUNCTION PANEL
ALT: 9 IU/L (ref 0–32)
AST: 7 IU/L (ref 0–40)
Albumin: 3.9 g/dL — ABNORMAL LOW (ref 4.0–5.0)
Alkaline Phosphatase: 74 IU/L (ref 44–121)
Bilirubin Total: 0.3 mg/dL (ref 0.0–1.2)
Bilirubin, Direct: 0.1 mg/dL (ref 0.00–0.40)
Total Protein: 6.6 g/dL (ref 6.0–8.5)

## 2023-08-15 LAB — BRAIN NATRIURETIC PEPTIDE: BNP: 39.9 pg/mL (ref 0.0–100.0)

## 2023-08-18 ENCOUNTER — Ambulatory Visit (INDEPENDENT_AMBULATORY_CARE_PROVIDER_SITE_OTHER): Admitting: Nurse Practitioner

## 2023-08-18 ENCOUNTER — Encounter (INDEPENDENT_AMBULATORY_CARE_PROVIDER_SITE_OTHER): Payer: Self-pay | Admitting: Nurse Practitioner

## 2023-08-18 VITALS — BP 122/82 | HR 77 | Temp 98.1°F | Ht 66.0 in | Wt 264.0 lb

## 2023-08-18 DIAGNOSIS — E66813 Obesity, class 3: Secondary | ICD-10-CM

## 2023-08-18 DIAGNOSIS — E063 Autoimmune thyroiditis: Secondary | ICD-10-CM

## 2023-08-18 DIAGNOSIS — E559 Vitamin D deficiency, unspecified: Secondary | ICD-10-CM

## 2023-08-18 DIAGNOSIS — F5089 Other specified eating disorder: Secondary | ICD-10-CM

## 2023-08-18 DIAGNOSIS — G4733 Obstructive sleep apnea (adult) (pediatric): Secondary | ICD-10-CM | POA: Diagnosis not present

## 2023-08-18 DIAGNOSIS — K760 Fatty (change of) liver, not elsewhere classified: Secondary | ICD-10-CM | POA: Diagnosis not present

## 2023-08-18 DIAGNOSIS — E88819 Insulin resistance, unspecified: Secondary | ICD-10-CM

## 2023-08-18 DIAGNOSIS — Z6841 Body Mass Index (BMI) 40.0 and over, adult: Secondary | ICD-10-CM

## 2023-08-18 DIAGNOSIS — R600 Localized edema: Secondary | ICD-10-CM

## 2023-08-18 MED ORDER — TOPIRAMATE 50 MG PO TABS
50.0000 mg | ORAL_TABLET | Freq: Two times a day (BID) | ORAL | 0 refills | Status: DC
Start: 2023-08-18 — End: 2023-09-10

## 2023-08-18 MED ORDER — METFORMIN HCL 500 MG PO TABS: ORAL_TABLET | ORAL | 0 refills | Status: DC

## 2023-08-18 MED ORDER — VITAMIN D (ERGOCALCIFEROL) 1.25 MG (50000 UNIT) PO CAPS: 50000.0000 [IU] | ORAL_CAPSULE | ORAL | 0 refills | Status: DC

## 2023-08-18 NOTE — Progress Notes (Signed)
 Office: [phone]  /  Fax: [phone]  WEIGHT SUMMARY AND BIOMETRICS  Weight Lost Since Last Visit: 0  Weight Gained Since Last Visit: 0   Vitals Temp: 98.1 F (36.7 C) BP: 122/82 Pulse Rate: 77 SpO2: 97 %   Anthropometric Measurements Height: 5' 6 (1.676 m) Weight: 264 lb (119.7 kg) BMI (Calculated): 42.63 Weight at Last Visit: 264 lb Weight Lost Since Last Visit: 0 Weight Gained Since Last Visit: 0 Starting Weight: 284 lb Total Weight Loss (lbs): 20 lb (9.072 kg)   Body Composition  Body Fat %: 51.1 % Fat Mass (lbs): 135 lbs Muscle Mass (lbs): 122.4 lbs Visceral Fat Rating : 13   Other Clinical Data Fasting: No Labs: No Today's Visit #: 22 Starting Date: 08/15/21     HPI  Chief Complaint: OBESITY  Alexy is here to discuss her progress with her obesity treatment plan. She is on the the Category 3 Plan and states she is following her eating plan approximately 70 % of the time. She states she is exercising - walking 30 minutes 3-4 days per week.   Interval History:  Since last office visit she stayed weight stable. 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 She has been getting about 90 grams of protein a day, using occasional meal replacement protein shakes. She is going to [facility] next week for vacation.  She does drink a lot of water - 80-120 ounces of water  a day and will only occasionally have a sprite zero.  She is currently on Lasix  40 mg every day and continues to have 1+ nonpitting edema. She has not been consistently wearing compression socks at work.   She does have sleep apnea and is wearing her CPAP 5 nights a week.  She does have a history of MASLD with hepatic steatosis on U/S in 2022.  Muscle mass and Body Fat% have stayed stable. She has a high weight of 330 in 03/10/19 - she has lost to 264 pounds today.   Pharmacotherapy for weight loss: She is currently taking Topamax  and metformin  off  label for medical weight loss.  Denies side effects.  She had been trying to get Zepbound  but must go through [facility]. She is currently on OCP's for birth control and does know dangers of getting pregnant on Topamax     PHYSICAL EXAM:  Blood pressure 122/82, pulse 77, temperature 98.1 F (36.7 C), height 5' 6 (1.676 m), weight 264 lb (119.7 kg), last menstrual period 07/23/2023, SpO2 97%. Body mass index is 42.61 kg/m.  General: She is overweight, cooperative, alert, well developed, and in no acute distress. PSYCH: Has normal mood, affect and thought process.   Extremities: No edema.  Neurologic: No gross sensory or motor deficits. No tremors or fasciculations noted.    DIAGNOSTIC DATA REVIEWED:  BMET    Component Value Date/Time   NA 139 04/09/2023 1037   K 4.1 04/09/2023 1037   CL 104 04/09/2023 1037   CO2 19 (L) 04/09/2023 1037   GLUCOSE 87 04/09/2023 1037   GLUCOSE 97 02/13/2022 0929   BUN 14 04/09/2023 1037   CREATININE 0.88 04/09/2023 1037   CALCIUM  9.1 04/09/2023 1037   GFRNONAA >60 02/13/2022 0929   GFRAA 85 12/20/2019 1154   Lab Results  Component Value Date   HGBA1C 5.3 04/09/2023   HGBA1C 5.4 11/22/2019   Lab Results  Component Value Date   INSULIN  17.5 04/09/2023   INSULIN  13.8 08/15/2021  Lab Results  Component Value Date   TSH 0.983 12/04/2022   CBC    Component Value Date/Time   WBC 9.4 02/13/2022 0929   RBC 4.26 02/13/2022 0929   HGB 12.7 02/13/2022 0929   HGB 13.2 07/27/2021 1402   HCT 37.3 02/13/2022 0929   HCT 39.1 07/27/2021 1402   PLT 338 02/13/2022 0929   PLT 323 07/27/2021 1402   MCV 87.6 02/13/2022 0929   MCV 90 07/27/2021 1402   MCH 29.8 02/13/2022 0929   MCHC 34.0 02/13/2022 0929   RDW 12.4 02/13/2022 0929   RDW 12.0 07/27/2021 1402   Iron Studies    Component Value Date/Time   IRON 61 07/27/2021 1402   TIBC 280 07/27/2021 1402   FERRITIN 24 07/27/2021 1402   IRONPCTSAT 22 07/27/2021 1402   Lipid  Panel     Component Value Date/Time   CHOL 184 08/15/2021 1108   TRIG 147 08/15/2021 1108   HDL 36 (L) 08/15/2021 1108   CHOLHDL 6.6 (H) 02/28/2021 1457   LDLCALC 122 (H) 08/15/2021 1108   Hepatic Function Panel     Component Value Date/Time   PROT 6.6 08/13/2023 0945   ALBUMIN 3.9 (L) 08/13/2023 0945   AST 7 08/13/2023 0945   ALT 9 08/13/2023 0945   ALKPHOS 74 08/13/2023 0945   BILITOT 0.3 08/13/2023 0945   BILIDIR 0.10 08/13/2023 0945   IBILI 0.4 09/18/2017 1109      Component Value Date/Time   TSH 0.983 12/04/2022 1005   Nutritional Lab Results  Component Value Date   VD25OH 71.4 04/09/2023   VD25OH 31.1 12/23/2022   VD25OH 36.9 07/17/2022     ASSESSMENT AND PLAN  TREATMENT PLAN FOR OBESITY:  Recommended Dietary Goals  Janet [name] is currently in the action stage of change. As such, her goal is to continue weight management plan. She has agreed to the Category 3 Plan.  Behavioral Intervention  We discussed the following Behavioral Modification Strategies today: increasing lower glycemic fruits and continue to work on maintaining a reduced calorie state, getting the recommended amount of protein, incorporating whole foods, making healthy choices, staying well hydrated and practicing mindfulness when eating..  Additional resources provided today: NA  Recommended Physical Activity Goals  Truly has been advised to work up to 150 minutes of moderate intensity aerobic activity a week and strengthening exercises 2-3 times per week for cardiovascular health, weight loss maintenance and preservation of muscle mass.   She has agreed to Think about enjoyable ways to increase daily physical activity and overcoming barriers to exercise and Increase physical activity in their day and reduce sedentary time (increase NEAT).   Pharmacotherapy We discussed various medication options to help Emmaline with her weight loss efforts and we both agreed to continue Metformin  500 mg at lunch  and Topiramate  50 mg BID.  ASSOCIATED CONDITIONS ADDRESSED TODAY  Action/Plan  Hypothyroidism due to Hashimoto thyroiditis Continue Levothyroxine  200 mcg every day Continue to follow with endocrinology  OSA on CPAP Continue CPAP use and try for 100% compliance  Insulin  resistance HOMA-IR 3.759 Continue decreasing simple carbohydrates and Metformin  500 mg QD -     metFORMIN  HCl; 1 full tablet at lunch  Dispense: 30 tablet; Refill: 0  Metabolic dysfunction-associated steatotic liver disease (MASLD) Continue focusing on decreasing saturated fat and simple carb diet Continue increased activity and weight loss  Class 3 severe obesity with serious comorbidity and body mass index (BMI) of 40.0 to 44.9 in adult, unspecified obesity type -  metFORMIN  HCl; 1 full tablet at lunch  Dispense: 30 tablet; Refill: 0 -     Topiramate ; Take 1 tablet (50 mg total) by mouth 2 (two) times daily.  Dispense: 60 tablet; Refill: 0  Vitamin D  deficiency -     Vitamin D  (Ergocalciferol ); Take 1 capsule (50,000 Units total) by mouth every 7 (seven) days.  Dispense: 4 capsule; Refill: 0  Other disorder of eating, emotional eating -     Topiramate ; Take 1 tablet (50 mg total) by mouth 2 (two) times daily.  Dispense: 60 tablet; Refill: 0  Peripheral edema Continue Lasix  40 mg every day Encourage use of compression socks while at work Add lemon or cucumber to water  as natural diuretics.          Return in about 4 weeks (around 09/15/2023).SABRA She was informed of the importance of frequent follow up visits to maximize her success with intensive lifestyle modifications for her multiple health conditions.   ATTESTASTION STATEMENTS:  Reviewed by clinician on day of visit: allergies, medications, problem list, medical history, surgical history, family history, social history, and previous encounter notes.     Unity Luepke ANP-C

## 2023-08-28 ENCOUNTER — Encounter: Payer: Self-pay | Admitting: Family

## 2023-08-28 ENCOUNTER — Telehealth: Admitting: Family

## 2023-08-28 ENCOUNTER — Ambulatory Visit: Payer: Self-pay | Admitting: Family Medicine

## 2023-08-28 DIAGNOSIS — J069 Acute upper respiratory infection, unspecified: Secondary | ICD-10-CM

## 2023-08-28 MED ORDER — PROMETHAZINE-DM 6.25-15 MG/5ML PO SYRP: 5.0000 mL | ORAL_SOLUTION | Freq: Three times a day (TID) | ORAL | 0 refills | Status: AC | PRN

## 2023-08-28 MED ORDER — BENZONATATE 200 MG PO CAPS
200.0000 mg | ORAL_CAPSULE | Freq: Three times a day (TID) | ORAL | 1 refills | Status: DC | PRN
Start: 2023-08-28 — End: 2023-10-22

## 2023-08-28 MED ORDER — CETIRIZINE HCL 10 MG PO TABS
10.0000 mg | ORAL_TABLET | Freq: Every day | ORAL | 1 refills | Status: AC
Start: 2023-08-28 — End: ?

## 2023-08-28 MED ORDER — FLUTICASONE PROPIONATE 50 MCG/ACT NA SUSP: 2.0000 | Freq: Every day | NASAL | 6 refills | Status: AC

## 2023-08-28 NOTE — Telephone Encounter (Signed)
 Pt has appt

## 2023-08-28 NOTE — Telephone Encounter (Signed)
 FYI Only or Action Required?: FYI only for provider.  Patient was last seen in primary care on 08/18/2023 by Jude Lonell BRAVO, NP.  Called Nurse Triage reporting Cough.  Symptoms began several days ago.  Interventions attempted: OTC medications: Mucinex.  Symptoms are: unchanged.  Triage Disposition: See Physician Within 24 Hours  Patient/caregiver understands and will follow disposition?: Yes              Copied from CRM #[id number]. Topic: Clinical - Red Word Triage >> Aug 28, 2023 11:10 AM Sasha H wrote: [facility] that prompted transfer to Nurse Triage: Pt believes she has upper respiratory in fection and is coughing up green phlegm ----------------------------------------------------------------------- From previous Reason for Contact - Scheduling: Patient/patient representative is calling to schedule an appointment. Refer to attachments for appointment information. Reason for Disposition  SEVERE coughing spells (e.g., whooping sound after coughing, vomiting after coughing)  Answer Assessment - Initial Assessment Questions This RN scheduled pt for a virtual office appt today at 5 PM as no appts in office today and pt has to work tmrw. This RN educated pt on new-worsening symptoms and when to call back/seek emergent care. Pt verbalized understanding and agrees to plan.     Nasal congestion and pressure in ears Drainage in throat that is making throat sore  ONSET: When did the cough begin?      Mon  SPUTUM: Describe the color of your sputum (e.g., none, dry cough; clear, white, yellow, green)     Green and yellow, sometimes white  HEMOPTYSIS: Are you coughing up any blood? If Yes, ask: How much? (e.g., flecks, streaks, tablespoons, etc.)     There is some blood in some of the mucus a few times  DIFFICULTY BREATHING: Are you having difficulty breathing? If Yes, ask: How bad is it? (e.g., mild, moderate, severe)      Harder to breathe when nose is  stopped up  FEVER: Do you have a fever? If Yes, ask: What is your temperature, how was it measured, and when did it start?     Pt doesn't think so  OTHER SYMPTOMS: Do you have any other symptoms? (e.g., runny nose, wheezing, chest pain)       Denies chest pain  Protocols used: Cough - Acute Productive-A-AH

## 2023-08-28 NOTE — Telephone Encounter (Signed)
 Virtual visit on DOD schedule today

## 2023-08-28 NOTE — Progress Notes (Signed)
 Virtual Visit Consent   [name], you are scheduled for a virtual visit with a [facility] provider today. Just as with appointments in the office, your consent must be obtained to participate. Your consent will be active for this visit and any virtual visit you may have with one of our providers in the next 365 days. If you have a MyChart account, a copy of this consent can be sent to you electronically.  As this is a virtual visit, video technology does not allow for your provider to perform a traditional examination. This may limit your provider's ability to fully assess your condition. If your provider identifies any concerns that need to be evaluated in person or the need to arrange testing (such as labs, EKG, etc.), we will make arrangements to do so. Although advances in technology are sophisticated, we cannot ensure that it will always work on either your end or our end. If the connection with a video visit is poor, the visit may have to be switched to a telephone visit. With either a video or telephone visit, we are not always able to ensure that we have a secure connection.  By engaging in this virtual visit, you consent to the provision of healthcare and authorize for your insurance to be billed (if applicable) for the services provided during this visit. Depending on your insurance coverage, you may receive a charge related to this service.  I need to obtain your verbal consent now. Are you willing to proceed with your visit today? [name] has provided verbal consent on 08/28/2023 for a virtual visit (video or telephone). Bari Learn, FNP  Date: 08/28/2023 2:10 PM   Virtual Visit via Video Note   I, Bari Learn, connected with  Janet [name]  ([id number], 12/30/1995) on 08/28/23 at  5:00 PM EDT by a video-enabled telemedicine application and verified that I am speaking with the correct person using two identifiers.  Location: Patient: Virtual Visit Location Patient:  Home Provider: Virtual Visit Location Provider: Home Office   I discussed the limitations of evaluation and management by telemedicine and the availability of in person appointments. The patient expressed understanding and agreed to proceed.    History of Present Illness: Janet [name] is a 28 y.o. who identifies as a female who was assigned female at birth, and is being seen today for cough that started 3 days ago.  HPI: Cough This is a new problem. The current episode started in the past 7 days. The problem has been waxing and waning. The problem occurs every few minutes. The cough is Productive of sputum and productive of purulent sputum. Associated symptoms include ear congestion, ear pain, headaches, nasal congestion, postnasal drip and a sore throat. Pertinent negatives include no chills, fever, myalgias, shortness of breath or wheezing. Risk factors for lung disease include smoking/tobacco exposure. She has tried rest and OTC cough suppressant for the symptoms. The treatment provided mild relief.    Problems:  Patient Active Problem List   Diagnosis Date Noted   Gastroesophageal reflux disease with esophagitis 06/12/2023   Insulin  resistance 03/18/2022   Lower extremity edema 01/17/2022   Polyphagia 01/17/2022   Other hyperlipidemia 08/30/2021   Severe sleep apnea 08/15/2021   Vitamin D  deficiency 08/15/2021   Absolute anemia 08/15/2021   Depression 08/15/2021   OSA on CPAP 11/22/2019   Menorrhagia with regular cycle 04/07/2019   History of anemia 04/07/2019   Stage 2 chronic kidney disease 04/07/2019   Numbness of  right hand 12/17/2018   Hypothyroidism 06/30/2017   GAD (generalized anxiety disorder) 06/05/2017   Tobacco abuse 12/07/2014   GERD (gastroesophageal reflux disease) 01/21/2014   Hashimoto's thyroiditis 01/26/2013   Migraine without aura 01/15/2013   Episodic tension type headache 01/15/2013   Morbid obesity (HCC) 01/15/2013   Insomnia, unspecified 01/15/2013    Dysfunctions associated with sleep stages or arousal from sleep 01/15/2013    Allergies: No Known Allergies Medications:  Current Outpatient Medications:    benzonatate  (TESSALON ) 200 MG capsule, Take 1 capsule (200 mg total) by mouth 3 (three) times daily as needed., Disp: 30 capsule, Rfl: 1   cetirizine  (ZYRTEC  ALLERGY) 10 MG tablet, Take 1 tablet (10 mg total) by mouth daily., Disp: 90 tablet, Rfl: 1   fluticasone  (FLONASE ) 50 MCG/ACT nasal spray, Place 2 sprays into both nostrils daily., Disp: 16 g, Rfl: 6   promethazine -dextromethorphan (PROMETHAZINE -DM) 6.25-15 MG/5ML syrup, Take 5 mLs by mouth 3 (three) times daily as needed for cough., Disp: 118 mL, Rfl: 0   busPIRone  (BUSPAR ) 5 MG tablet, Take 2 tablets (10 mg total) by mouth 3 (three) times daily., Disp: 180 tablet, Rfl: 4   cyanocobalamin  (VITAMIN B12) 500 MCG tablet, Take 1 tablet (500 mcg total) by mouth daily., Disp: 90 tablet, Rfl: 0   ferrous sulfate  324 (65 Fe) MG TBEC, Take 1 tablet (324 mg total) by mouth daily., Disp: 90 tablet, Rfl: 3   furosemide  (LASIX ) 40 MG tablet, Take 0.5-1 tablets (20-40 mg total) by mouth daily as needed for edema., Disp: 90 tablet, Rfl: 3   ibuprofen (ADVIL) 200 MG tablet, Take 400-600 mg by mouth every 6 (six) hours as needed for headache., Disp: , Rfl:    levothyroxine  (SYNTHROID ) 200 MCG tablet, Take 200 mcg by mouth every morning., Disp: , Rfl:    metFORMIN  (GLUCOPHAGE ) 500 MG tablet, 1 full tablet at lunch, Disp: 30 tablet, Rfl: 0   norethindrone-ethinyl estradiol-iron (LOESTRIN FE 1.5/30) 1.5-30 MG-MCG tablet, Take 1 tablet by mouth daily. **dose change, Disp: 84 tablet, Rfl: 4   omeprazole  (PRILOSEC) 20 MG capsule, Take 1 capsule (20 mg total) by mouth daily., Disp: 90 capsule, Rfl: 3   ondansetron  (ZOFRAN ) 4 MG tablet, Take 1 tablet (4 mg total) by mouth every 8 (eight) hours as needed for nausea or vomiting., Disp: 30 tablet, Rfl: 0   SF 5000 PLUS 1.1 % CREA dental cream, Take by mouth every  morning., Disp: , Rfl:    sodium bicarbonate 650 MG tablet, Take 650 mg by mouth 3 (three) times daily., Disp: , Rfl:    topiramate  (TOPAMAX ) 50 MG tablet, Take 1 tablet (50 mg total) by mouth 2 (two) times daily., Disp: 60 tablet, Rfl: 0   Vitamin D , Ergocalciferol , (DRISDOL ) 1.25 MG (50000 UNIT) CAPS capsule, Take 1 capsule (50,000 Units total) by mouth every 7 (seven) days., Disp: 4 capsule, Rfl: 0  Observations/Objective: Patient is well-developed, well-nourished in no acute distress.  Resting comfortably  at home.  Head is normocephalic, atraumatic.  No labored breathing. Speech is clear and coherent with logical content.  Patient is alert and oriented at baseline.  Coarse nonproductive cough  Assessment and Plan: 1. Viral URI with cough (Primary) - fluticasone  (FLONASE ) 50 MCG/ACT nasal spray; Place 2 sprays into both nostrils daily.  Dispense: 16 g; Refill: 6 - cetirizine  (ZYRTEC  ALLERGY) 10 MG tablet; Take 1 tablet (10 mg total) by mouth daily.  Dispense: 90 tablet; Refill: 1 - benzonatate  (TESSALON ) 200 MG capsule; Take 1 capsule (  200 mg total) by mouth 3 (three) times daily as needed.  Dispense: 30 capsule; Refill: 1 - promethazine -dextromethorphan (PROMETHAZINE -DM) 6.25-15 MG/5ML syrup; Take 5 mLs by mouth 3 (three) times daily as needed for cough.  Dispense: 118 mL; Refill: 0  - Take meds as prescribed - Use a cool mist humidifier  -Use saline nose sprays frequently -Force fluids -For any cough or congestion  Use plain Mucinex- regular strength or max strength is fine -For fever or aces or pains- take tylenol  or ibuprofen. -Throat lozenges if help -Follow up if symptoms worsen or do not improve   Follow Up Instructions: I discussed the assessment and treatment plan with the patient. The patient was provided an opportunity to ask questions and all were answered. The patient agreed with the plan and demonstrated an understanding of the instructions.  A copy of instructions  were sent to the patient via MyChart unless otherwise noted below.     The patient was advised to call back or seek an in-person evaluation if the symptoms worsen or if the condition fails to improve as anticipated.    Bari Learn, FNP

## 2023-09-09 ENCOUNTER — Other Ambulatory Visit

## 2023-09-10 ENCOUNTER — Ambulatory Visit (INDEPENDENT_AMBULATORY_CARE_PROVIDER_SITE_OTHER): Admitting: Adult Health

## 2023-09-10 ENCOUNTER — Encounter (INDEPENDENT_AMBULATORY_CARE_PROVIDER_SITE_OTHER): Payer: Self-pay | Admitting: Adult Health

## 2023-09-10 VITALS — BP 127/81 | HR 79 | Temp 98.0°F | Ht 66.0 in | Wt 261.0 lb

## 2023-09-10 DIAGNOSIS — E559 Vitamin D deficiency, unspecified: Secondary | ICD-10-CM

## 2023-09-10 DIAGNOSIS — E88819 Insulin resistance, unspecified: Secondary | ICD-10-CM

## 2023-09-10 DIAGNOSIS — K760 Fatty (change of) liver, not elsewhere classified: Secondary | ICD-10-CM | POA: Diagnosis not present

## 2023-09-10 DIAGNOSIS — G4733 Obstructive sleep apnea (adult) (pediatric): Secondary | ICD-10-CM

## 2023-09-10 DIAGNOSIS — F5089 Other specified eating disorder: Secondary | ICD-10-CM

## 2023-09-10 DIAGNOSIS — Z6841 Body Mass Index (BMI) 40.0 and over, adult: Secondary | ICD-10-CM

## 2023-09-10 MED ORDER — TOPIRAMATE 50 MG PO TABS: 50.0000 mg | ORAL_TABLET | Freq: Two times a day (BID) | ORAL | 0 refills | Status: DC

## 2023-09-10 MED ORDER — VITAMIN D (ERGOCALCIFEROL) 1.25 MG (50000 UNIT) PO CAPS
50000.0000 [IU] | ORAL_CAPSULE | ORAL | 0 refills | Status: DC
Start: 2023-09-10 — End: 2023-10-22

## 2023-09-10 MED ORDER — METFORMIN HCL 500 MG PO TABS: ORAL_TABLET | ORAL | 0 refills | Status: DC

## 2023-09-10 NOTE — Progress Notes (Signed)
 WEIGHT SUMMARY AND BIOMETRICS  Vitals Temp: 98 F (36.7 C) BP: 127/81 Pulse Rate: 79 SpO2: 98 %   Anthropometric Measurements Height: 5' 6 (1.676 m) Weight: 261 lb (118.4 kg) BMI (Calculated): 42.15 Weight at Last Visit: 164lb Weight Lost Since Last Visit: 3lb Weight Gained Since Last Visit: 0lb Starting Weight: 284lb Total Weight Loss (lbs): 23 lb (10.4 kg)   Body Composition  Body Fat %: 50.3 % Fat Mass (lbs): 131.4 lbs Muscle Mass (lbs): 123.4 lbs Total Body Water  (lbs): 102.4 lbs Visceral Fat Rating : 13   Other Clinical Data Fasting: No Labs: No Today's Visit #: 23 Starting Date: 08/15/21    Chief Complaint:   OBESITY Janet [name] is here to discuss her progress with her obesity treatment plan.  She is on the the Category 3 Plan and states she is following her eating plan approximately 70 % of the time.  She states she is exercising Brisk Walking 30 minutes 3-4 times per week.  Interim History:  She has started a new work schedule, 0530-1400 She has enjoyed the ability to run errands or walk in afternoon after work. She has been challenged to get to sleep before 2300, as she needs to wake at 0315 and report to work NLT 0530  She reports consistently wearing nightly CPAP  Discussed risks/benefits of GIP/GLP-1 therapy to treat MASLD, OSA, Obesity  Her insurance will not cover AOMs from Rx provided by [facility]  She will contact [facility] and inquire about Zepbound  therapy  Of Note- She is compliant with oral birth control and is also abstinent   Subjective:   1. OSA on CPAP 01/29/2019 Janet [name] Sleep Study reviewed in [facility] She reports wearing nightly CPAP for at least 4 hrs She has started a new work schedule, 0530-1400  2. Metabolic dysfunction-associated steatotic liver disease (MASLD) 04/11/2020 Narrative & Impression  CLINICAL DATA:  Right upper quadrant pain   EXAM: ULTRASOUND ABDOMEN LIMITED RIGHT UPPER QUADRANT   COMPARISON:   12/15/2018   FINDINGS: Gallbladder:   Interval development of multiple gallstones and sludge. No gallbladder wall thickening or pericholecystic fluid. Sonographic Murphy sign negative per technologist.   Common bile duct:   Diameter: 6 mm   Liver:   No focal lesion.   Diffusely increased parenchymal echogenicity.   Portal vein is patent on color Doppler imaging with normal direction of blood flow towards the liver.   Other: None.   IMPRESSION: 1. Cholelithiasis without additional sonographic evidence of acute cholecystitis. 2. Diffuse increased echogenicity of the hepatic parenchyma is a nonspecific indicator of hepatocellular dysfunction, most commonly steatosis.     Electronically Signed   By: Aliene Lloyd M.D.   On: 04/11/2020 09:44   3. Vitamin D  deficiency  Latest Reference Range & Units 07/17/22 10:44 12/23/22 10:47 04/09/23 10:37  Vitamin D , 25-Hydroxy 30.0 - 100.0 ng/mL 36.9 31.1 71.4   She is on weekly Ergocalciferol - denies N/V/Muscle Weakness She will travel to [facility] next week for 5 days with her immediate family  4. Insulin  resistance HOMA-IR 3.759  Latest Reference Range & Units 07/17/22 10:44 12/23/22 10:47 04/09/23 10:37  INSULIN  2.6 - 24.9 uIU/mL 22.8 15.1 17.5   She is on Metformin  500mg - takes daily dose at lunch She denies GI upset  5. Other disorder of eating, emotional eating She endorses stable mood She denies SI/HI She denies paresthesias or word finding difficulties She is on daily Topamax  50mg  BID  Assessment/Plan:   1. OSA  on CPAP (Primary) Continue with weight loss efforts Continue nightly CPAP Avoid tobacco use  2. Metabolic dysfunction-associated steatotic liver disease (MASLD) Continue with weight loss efforts  3. Vitamin D  deficiency Refill - Vitamin D , Ergocalciferol , (DRISDOL ) 1.25 MG (50000 UNIT) CAPS capsule; Take 1 capsule (50,000 Units total) by mouth every 7 (seven) days.  Dispense: 4 capsule; Refill:  0  4. Insulin  resistance HOMA-IR 3.759 Refill - metFORMIN  (GLUCOPHAGE ) 500 MG tablet; 1 full tablet at lunch  Dispense: 30 tablet; Refill: 0  5. Other disorder of eating, emotional eating Refill - topiramate  (TOPAMAX ) 50 MG tablet; Take 1 tablet (50 mg total) by mouth 2 (two) times daily.  Dispense: 60 tablet; Refill: 0  6. Morbid obesity (HCC), Current BMI 40.9 Call [facility] with [facility]- inquire about Zepbound  therapy  Janet [name] is currently in the action stage of change. As such, her goal is to continue with weight loss efforts. She has agreed to the Category 3 Plan.   Exercise goals: All adults should avoid inactivity. Some physical activity is better than none, and adults who participate in any amount of physical activity gain some health benefits. Adults should also include muscle-strengthening activities that involve all major muscle groups on 2 or more days a week. Daily Walking  Behavioral modification strategies: increasing lean protein intake, decreasing simple carbohydrates, increasing vegetables, increasing water  intake, meal planning and cooking strategies, keeping healthy foods in the home, ways to avoid boredom eating, and planning for success.  Janet [name] has agreed to follow-up with our clinic in 4 weeks. She was informed of the importance of frequent follow-up visits to maximize her success with intensive lifestyle modifications for her multiple health conditions.   Objective:   Blood pressure 127/81, pulse 79, temperature 98 F (36.7 C), height 5' 6 (1.676 m), weight 261 lb (118.4 kg), last menstrual period 07/23/2023, SpO2 98%. Body mass index is 42.13 kg/m.  General: Cooperative, alert, well developed, in no acute distress. HEENT: Conjunctivae and lids unremarkable. Cardiovascular: Regular rhythm.  Lungs: Normal work of breathing. Neurologic: No focal deficits.   Lab Results  Component Value Date   CREATININE 0.88 04/09/2023   BUN 14 04/09/2023   NA 139 04/09/2023    K 4.1 04/09/2023   CL 104 04/09/2023   CO2 19 (L) 04/09/2023   Lab Results  Component Value Date   ALT 9 08/13/2023   AST 7 08/13/2023   ALKPHOS 74 08/13/2023   BILITOT 0.3 08/13/2023   Lab Results  Component Value Date   HGBA1C 5.3 04/09/2023   HGBA1C 5.5 12/23/2022   HGBA1C 5.2 07/17/2022   HGBA1C 5.5 03/18/2022   HGBA1C 5.5 08/15/2021   Lab Results  Component Value Date   INSULIN  17.5 04/09/2023   INSULIN  15.1 12/23/2022   INSULIN  22.8 07/17/2022   INSULIN  13.8 08/15/2021   Lab Results  Component Value Date   TSH 0.983 12/04/2022   Lab Results  Component Value Date   CHOL 184 08/15/2021   HDL 36 (L) 08/15/2021   LDLCALC 122 (H) 08/15/2021   TRIG 147 08/15/2021   CHOLHDL 6.6 (H) 02/28/2021   Lab Results  Component Value Date   VD25OH 71.4 04/09/2023   VD25OH 31.1 12/23/2022   VD25OH 36.9 07/17/2022   Lab Results  Component Value Date   WBC 9.4 02/13/2022   HGB 12.7 02/13/2022   HCT 37.3 02/13/2022   MCV 87.6 02/13/2022   PLT 338 02/13/2022   Lab Results  Component Value Date   IRON 61 07/27/2021  TIBC 280 07/27/2021   FERRITIN 24 07/27/2021   Attestation Statements:   Reviewed by clinician on day of visit: allergies, medications, problem list, medical history, surgical history, family history, social history, and previous encounter notes.  I have reviewed the above documentation for accuracy and completeness, and I agree with the above. -  Omere Marti d. Jamiah Homeyer, NP-C

## 2023-09-24 NOTE — Telephone Encounter (Signed)
Patient has not responded to our calls.

## 2023-10-08 ENCOUNTER — Ambulatory Visit (INDEPENDENT_AMBULATORY_CARE_PROVIDER_SITE_OTHER): Admitting: Adult Health

## 2023-10-09 ENCOUNTER — Ambulatory Visit (INDEPENDENT_AMBULATORY_CARE_PROVIDER_SITE_OTHER): Admitting: Adult Health

## 2023-10-13 NOTE — Progress Notes (Deleted)
   SUBJECTIVE: Discussed the use of AI scribe software for clinical note transcription with the patient, who gave verbal consent to proceed.  Chief Complaint: Obesity  Interim History: ***  Janet [name] is here to discuss her progress with her obesity treatment plan. She is on the {HWW Weight Loss Plan:210964005} and states she {CHL AMB IS/IS NOT:210130109} following her eating plan approximately *** % of the time. She states she {CHL AMB IS/IS NOT:210130109} exercising *** minutes *** times per week.   OBJECTIVE: Visit Diagnoses: Problem List Items Addressed This Visit     Hypothyroidism   OSA on CPAP - Primary   Vitamin D  deficiency   Insulin  resistance   Other Visit Diagnoses       Metabolic dysfunction-associated steatotic liver disease (MASLD)         Other disorder of eating, emotional eating           No data recorded No data recorded No data recorded No data recorded   ASSESSMENT AND PLAN:  Diet: Janet [name] {CHL AMB IS/IS NOT:210130109} currently in the action stage of change. As such, her goal is to {HWW Weight Loss Efforts:210964006}. She {HAS HAS WNU:81165} agreed to {HWW Weight Loss Plan:210964005}.  Exercise: Janet [name] has been instructed {HWW Exercise:210964007} for weight loss and overall health benefits.   Behavior Modification:  We discussed the following Behavioral Modification Strategies today: {HWW Behavior Modification:210964008}. We discussed various medication options to help Janet [name] with her weight loss efforts and we both agreed to ***.  No follow-ups on file.SABRA She was informed of the importance of frequent follow up visits to maximize her success with intensive lifestyle modifications for her multiple health conditions.  Attestation Statements:   Reviewed by clinician on day of visit: allergies, medications, problem list, medical history, surgical history, family history, social history, and previous encounter notes.   Time spent on visit including  pre-visit chart review and post-visit care and charting was *** minutes.    Claus Silvestro, PA-C

## 2023-10-14 ENCOUNTER — Ambulatory Visit (INDEPENDENT_AMBULATORY_CARE_PROVIDER_SITE_OTHER): Admitting: Physician Assistant

## 2023-10-14 DIAGNOSIS — K760 Fatty (change of) liver, not elsewhere classified: Secondary | ICD-10-CM

## 2023-10-14 DIAGNOSIS — E88819 Insulin resistance, unspecified: Secondary | ICD-10-CM

## 2023-10-14 DIAGNOSIS — G4733 Obstructive sleep apnea (adult) (pediatric): Secondary | ICD-10-CM

## 2023-10-14 DIAGNOSIS — E063 Autoimmune thyroiditis: Secondary | ICD-10-CM

## 2023-10-14 DIAGNOSIS — E559 Vitamin D deficiency, unspecified: Secondary | ICD-10-CM

## 2023-10-14 DIAGNOSIS — F5089 Other specified eating disorder: Secondary | ICD-10-CM

## 2023-10-22 ENCOUNTER — Ambulatory Visit (INDEPENDENT_AMBULATORY_CARE_PROVIDER_SITE_OTHER): Admitting: Adult Health

## 2023-10-22 ENCOUNTER — Encounter (INDEPENDENT_AMBULATORY_CARE_PROVIDER_SITE_OTHER): Payer: Self-pay | Admitting: Adult Health

## 2023-10-22 VITALS — BP 117/76 | HR 86 | Temp 98.3°F | Ht 67.0 in | Wt 254.0 lb

## 2023-10-22 DIAGNOSIS — E559 Vitamin D deficiency, unspecified: Secondary | ICD-10-CM

## 2023-10-22 DIAGNOSIS — F5089 Other specified eating disorder: Secondary | ICD-10-CM | POA: Diagnosis not present

## 2023-10-22 DIAGNOSIS — K760 Fatty (change of) liver, not elsewhere classified: Secondary | ICD-10-CM | POA: Diagnosis not present

## 2023-10-22 DIAGNOSIS — E88819 Insulin resistance, unspecified: Secondary | ICD-10-CM

## 2023-10-22 DIAGNOSIS — Z6839 Body mass index (BMI) 39.0-39.9, adult: Secondary | ICD-10-CM

## 2023-10-22 MED ORDER — TOPIRAMATE 50 MG PO TABS: ORAL_TABLET | ORAL | 0 refills | Status: DC

## 2023-10-22 MED ORDER — METFORMIN HCL 500 MG PO TABS: ORAL_TABLET | ORAL | 0 refills | Status: DC

## 2023-10-22 MED ORDER — VITAMIN D (ERGOCALCIFEROL) 1.25 MG (50000 UNIT) PO CAPS: 50000.0000 [IU] | ORAL_CAPSULE | ORAL | 0 refills | Status: DC

## 2023-10-22 NOTE — Progress Notes (Addendum)
 WEIGHT SUMMARY AND BIOMETRICS  Vitals Temp: 98.3 F (36.8 C) BP: 117/76 Pulse Rate: 86 SpO2: 99 %   Anthropometric Measurements Height: 5' 7 (1.702 m) Weight: 254 lb (115.2 kg) BMI (Calculated): 39.77 Weight at Last Visit: 261 lb Weight Lost Since Last Visit: 7 lb Weight Gained Since Last Visit: 0 Starting Weight: 284 lb Total Weight Loss (lbs): 30 lb (13.6 kg)   Body Composition  Body Fat %: 49.5 % Fat Mass (lbs): 125.8 lbs Muscle Mass (lbs): 121.8 lbs Total Body Water  (lbs): 100 lbs Visceral Fat Rating : 12   Other Clinical Data Fasting: no Labs: no Today's Visit #: 24 Starting Date: 08/15/21    Chief Complaint:   OBESITY Janet [name] is here to discuss her progress with her obesity treatment plan.  She is on the the Category 3 Plan and states she is following her eating plan approximately 75 % of the time.  She states she is exercising Walking (at work and after work) 45 minutes 5 times per week.  Interim History:  She has established with [facility] on 10/07/2023 She started on weekly Zepbound  2.5mg  on 10/15/2023 She will take second dose today. Denies mass in neck, dysphagia, dyspepsia, persistent hoarseness, abdominal pain, or N/V/C   With the added support of Zepbound , she is interested in reducing other weight loss medications, ie: Metformin , Topamax   She reports earlier satiety and subsequent smaller portions at meals  Subjective:   1. Insulin  resistance HOMA-IR 3.759 She is on daily Metformin  500mg - takes with lunch [facility] started her on weekly loading dose Zepbound  2.5mg  She endorses stable appetite and denies SE with either medication. She would like to decrease oral AOMs since she has the benefit of weekly GIP/GLP-1 therapy  2. Metabolic dysfunction-associated steatotic liver disease (MASLD) She denies RUQ pain She has established with [facility] on 10/07/2023 She started on weekly Zepbound  2.5mg  on 10/15/2023 She will  take second dose today. Denies mass in neck, dysphagia, dyspepsia, persistent hoarseness, abdominal pain, or N/V/C   3. Vitamin D  deficiency She endorses stable energy levels She is in weekly Ergocalciferol - denies N/V/Muscle Weakness  4. Other disorder of eating, emotional eating BP excellent at OV Started on Topamax  25mg  on 02/18/2022 03/18/2022 Topiramate  increased to 50 mg BID Nephrology started her on Bicarb 650mg  TID on/about 04/01/23 for low Co2 levels  She denies paresthesias or word finding problems. She denies dizziness/lightheadedness, muscle cramps, spasms, confusion, or dyspnea   Latest Reference Range & Units 07/17/22 10:44 12/23/22 10:47 04/09/23 10:37  CO2 20 - 29 mmol/L 19 (L) 19 (L) 19 (L)  (L): Data is abnormally low  Assessment/Plan:   1. Insulin  resistance HOMA-IR 3.759 Refill and DECREASE - metFORMIN  (GLUCOPHAGE ) 500 MG tablet; 1/2 tablet at lunch  Dispense: 30 tablet; Refill: 0  2. Metabolic dysfunction-associated steatotic liver disease (MASLD) (Primary) Continue with weight loss efforts Continue to increase regular wwalking Avoid hepatotoxic substances  3. Vitamin D  deficiency Refill - Vitamin D , Ergocalciferol , (DRISDOL ) 1.25 MG (50000 UNIT) CAPS capsule; Take 1 capsule (50,000 Units total) by mouth every 7 (seven) days.  Dispense: 4 capsule; Refill: 0  4. Other disorder of eating, emotional eating Refill and DECREASE - topiramate  (TOPAMAX ) 50 MG tablet; Take daily in the evening  Dispense: 30 tablet; Refill: 0 Recommend to ecrease bicarb at next OV, correlate with labs Fall 2025 Contact prescriber of sodium bicarb 650 mg TID  5. Morbid obesity (HCC), Current BMI 39.8  [name] is  currently in the action stage of change. As such, her goal is to continue with weight loss efforts. She has agreed to the Category 3 Plan.   Exercise goals: All adults should avoid inactivity. Some physical activity is better than none, and adults who participate in any amount  of physical activity gain some health benefits. For substantial health benefits, adults should do at least 150 minutes (2 hours and 30 minutes) a week of moderate-intensity, or 75 minutes (1 hour and 15 minutes) a week of vigorous-intensity aerobic physical activity, or an equivalent combination of moderate- and vigorous-intensity aerobic activity. Aerobic activity should be performed in episodes of at least 10 minutes, and preferably, it should be spread throughout the week. Adults should also include muscle-strengthening activities that involve all major muscle groups on 2 or more days a week.  Behavioral modification strategies: increasing lean protein intake, decreasing simple carbohydrates, increasing vegetables, increasing water  intake, no skipping meals, meal planning and cooking strategies, keeping healthy foods in the home, and planning for success.  Tonesha has agreed to follow-up with our clinic in 4 weeks. She was informed of the importance of frequent follow-up visits to maximize her success with intensive lifestyle modifications for her multiple health conditions.   Check Fasting Labs late Fall 2025  Objective:   Blood pressure 117/76, pulse 86, temperature 98.3 F (36.8 C), height 5' 7 (1.702 m), weight 254 lb (115.2 kg), SpO2 99%. Body mass index is 39.78 kg/m.  General: Cooperative, alert, well developed, in no acute distress. HEENT: Conjunctivae and lids unremarkable. Cardiovascular: Regular rhythm.  Lungs: Normal work of breathing. Neurologic: No focal deficits.   Lab Results  Component Value Date   CREATININE 0.88 04/09/2023   BUN 14 04/09/2023   NA 139 04/09/2023   K 4.1 04/09/2023   CL 104 04/09/2023   CO2 19 (L) 04/09/2023   Lab Results  Component Value Date   ALT 9 08/13/2023   AST 7 08/13/2023   ALKPHOS 74 08/13/2023   BILITOT 0.3 08/13/2023   Lab Results  Component Value Date   HGBA1C 5.3 04/09/2023   HGBA1C 5.5 12/23/2022   HGBA1C 5.2 07/17/2022    HGBA1C 5.5 03/18/2022   HGBA1C 5.5 08/15/2021   Lab Results  Component Value Date   INSULIN  17.5 04/09/2023   INSULIN  15.1 12/23/2022   INSULIN  22.8 07/17/2022   INSULIN  13.8 08/15/2021   Lab Results  Component Value Date   TSH 0.983 12/04/2022   Lab Results  Component Value Date   CHOL 184 08/15/2021   HDL 36 (L) 08/15/2021   LDLCALC 122 (H) 08/15/2021   TRIG 147 08/15/2021   CHOLHDL 6.6 (H) 02/28/2021   Lab Results  Component Value Date   VD25OH 71.4 04/09/2023   VD25OH 31.1 12/23/2022   VD25OH 36.9 07/17/2022   Lab Results  Component Value Date   WBC 9.4 02/13/2022   HGB 12.7 02/13/2022   HCT 37.3 02/13/2022   MCV 87.6 02/13/2022   PLT 338 02/13/2022   Lab Results  Component Value Date   IRON 61 07/27/2021   TIBC 280 07/27/2021   FERRITIN 24 07/27/2021   Attestation Statements:   Reviewed by clinician on day of visit: allergies, medications, problem list, medical history, surgical history, family history, social history, and previous encounter notes.  I have reviewed the above documentation for accuracy and completeness, and I agree with the above. -  Shizuko Wojdyla d. Cully Luckow, NP-C

## 2023-11-05 ENCOUNTER — Ambulatory Visit (INDEPENDENT_AMBULATORY_CARE_PROVIDER_SITE_OTHER): Admitting: Adult Health

## 2023-11-19 ENCOUNTER — Ambulatory Visit (INDEPENDENT_AMBULATORY_CARE_PROVIDER_SITE_OTHER): Payer: Self-pay | Admitting: Adult Health

## 2023-11-19 ENCOUNTER — Encounter (INDEPENDENT_AMBULATORY_CARE_PROVIDER_SITE_OTHER): Payer: Self-pay | Admitting: Adult Health

## 2023-11-19 VITALS — BP 128/84 | HR 85 | Temp 97.7°F | Ht 67.0 in | Wt 245.0 lb

## 2023-11-19 DIAGNOSIS — E88819 Insulin resistance, unspecified: Secondary | ICD-10-CM

## 2023-11-19 DIAGNOSIS — Z6839 Body mass index (BMI) 39.0-39.9, adult: Secondary | ICD-10-CM

## 2023-11-19 DIAGNOSIS — E559 Vitamin D deficiency, unspecified: Secondary | ICD-10-CM

## 2023-11-19 DIAGNOSIS — F5089 Other specified eating disorder: Secondary | ICD-10-CM

## 2023-11-19 DIAGNOSIS — K760 Fatty (change of) liver, not elsewhere classified: Secondary | ICD-10-CM

## 2023-11-19 MED ORDER — VITAMIN D (ERGOCALCIFEROL) 1.25 MG (50000 UNIT) PO CAPS
50000.0000 [IU] | ORAL_CAPSULE | ORAL | 0 refills | Status: AC
Start: 2023-11-19 — End: ?

## 2023-11-19 MED ORDER — TOPIRAMATE 25 MG PO TABS: ORAL_TABLET | ORAL | 0 refills | Status: DC

## 2023-11-19 MED ORDER — METFORMIN HCL 500 MG PO TABS
ORAL_TABLET | ORAL | 0 refills | Status: AC
Start: 2023-11-19 — End: ?

## 2023-11-19 NOTE — Progress Notes (Signed)
 WEIGHT SUMMARY AND BIOMETRICS  No data recorded Anthropometric Measurements Height: 5' 7 (1.702 m) Weight at Last Visit: 254 lb Starting Weight: 284 lb   No data recorded Other Clinical Data Fasting: no Labs: no Today's Visit #: 25 Starting Date: 08/15/21    Chief Complaint:   OBESITY Janet [name] is here to discuss her progress with her obesity treatment plan.  She is on the the Category 3 Plan and states she is following her eating plan approximately 80 % of the time.  She states she is exercising Walking 30 minutes 5 times per week.  Interim History:  Last OV with [facility] was on 10/22/2023 Medications Reduced: metFORMIN  (GLUCOPHAGE ) 500 MG tablet; 1/2 tablet at lunch   - topiramate  (TOPAMAX ) 50 MG one tab daily  Chronic f/u with [facility] provider on 11/05/2023 Zepbound  2.5mg  increased to 5mg   Initial OV with [facility] on 11/10/2023  Of note- Janet [name] lives at home with both her parents. 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 Schedule: M-F 5:30am-2pm   Subjective:   1. Insulin  resistance HOMA-IR 3.759  Latest Reference Range & Units 07/17/22 10:44 12/23/22 10:47 04/09/23 10:37  INSULIN  2.6 - 24.9 uIU/mL 22.8 15.1 17.5   Last OV with [facility] was on 10/22/2023 Medications Reduced: metFORMIN  (GLUCOPHAGE ) 500 MG tablet; 1/2 tablet at lunch    2. Metabolic dysfunction-associated steatotic liver disease (MASLD)  Latest Reference Range & Units 08/13/23 09:45  Alkaline Phosphatase 44 - 121 IU/L 74  Albumin 4.0 - 5.0 g/dL 3.9 (L)  AST 0 - 40 IU/L 7  ALT 0 - 32 IU/L 9  (L): Data is abnormally low  She denies RUQ pain  3. Vitamin D  deficiency  Latest Reference Range & Units 07/17/22 10:44 12/23/22 10:47 04/09/23 10:37  Vitamin D , 25-Hydroxy 30.0 - 100.0 ng/mL 36.9 31.1 71.4   She is on weekly Ergocalciferol   4.Other Disorder of Eating, EE Last OV with [facility] was on 10/22/2023 Medications Reduced: - topiramate  (TOPAMAX ) 50 MG one tab daily Established  Nephrologist manages Na+ Bicarb 650mg  TID Will wean off Topiramate  and pt instructed to f/u with Nephrologist on how to wean off Na+ Bicard  Assessment/Plan:   1. Insulin  resistance HOMA-IR 3.759 (Primary) Refill  metFORMIN  (GLUCOPHAGE ) 500 MG tablet 1/2 tablet at lunch Dispense: 30 tablet, Refills: 0 ordered   2. Metabolic dysfunction-associated steatotic liver disease (MASLD) Continue with weight loss efforts Monitor Labs  3. Vitamin D  deficiency Refill  Vitamin D , Ergocalciferol , (DRISDOL ) 1.25 MG (50000 UNIT) CAPS capsule Take 1 capsule (50,000 Units total) by mouth every 7 (seven) days. Dispense: 4 capsule, Refills: 0 ordered   4. Other Disorder of Eating, EE Refill and decrease Topamax  25mg  Take 25 mg every other day for two weeks, then every four days for two weeks, before complete discontinuation. Contact established Nephrologist, re: weaning off Na+ Bicarb 650mg  TID  5. Morbid obesity (HCC), Current BMI 39.8  Janet [name] is currently in the action stage of change. As such, her goal is to continue with weight loss efforts. She has agreed to the Category 3 Plan.   Exercise goals: For substantial health benefits, adults should do at least 150 minutes (2 hours and 30 minutes) a week of moderate-intensity, or 75 minutes (1 hour and 15 minutes) a week of vigorous-intensity aerobic physical activity, or an equivalent combination of moderate- and vigorous-intensity aerobic activity. Aerobic activity should be performed in episodes of at least 10 minutes, and preferably, it should be spread throughout  the week.  Behavioral modification strategies: increasing lean protein intake, decreasing simple carbohydrates, increasing vegetables, increasing water  intake, no skipping meals, meal planning and cooking strategies, keeping healthy foods in the home, ways to avoid boredom eating, and planning for success.  Janet [name] has agreed to follow-up with our clinic in 4 weeks. She was informed of the  importance of frequent follow-up visits to maximize her success with intensive lifestyle modifications for her multiple health conditions.   Check Fasting Labs at next OV  Objective:   Height 5' 7 (1.702 m), last menstrual period 11/13/2023. Body mass index is 39.78 kg/m.  General: Cooperative, alert, well developed, in no acute distress. HEENT: Conjunctivae and lids unremarkable. Cardiovascular: Regular rhythm.  Lungs: Normal work of breathing. Neurologic: No focal deficits.   Lab Results  Component Value Date   CREATININE 0.88 04/09/2023   BUN 14 04/09/2023   NA 139 04/09/2023   K 4.1 04/09/2023   CL 104 04/09/2023   CO2 19 (L) 04/09/2023   Lab Results  Component Value Date   ALT 9 08/13/2023   AST 7 08/13/2023   ALKPHOS 74 08/13/2023   BILITOT 0.3 08/13/2023   Lab Results  Component Value Date   HGBA1C 5.3 04/09/2023   HGBA1C 5.5 12/23/2022   HGBA1C 5.2 07/17/2022   HGBA1C 5.5 03/18/2022   HGBA1C 5.5 08/15/2021   Lab Results  Component Value Date   INSULIN  17.5 04/09/2023   INSULIN  15.1 12/23/2022   INSULIN  22.8 07/17/2022   INSULIN  13.8 08/15/2021   Lab Results  Component Value Date   TSH 0.983 12/04/2022   Lab Results  Component Value Date   CHOL 184 08/15/2021   HDL 36 (L) 08/15/2021   LDLCALC 122 (H) 08/15/2021   TRIG 147 08/15/2021   CHOLHDL 6.6 (H) 02/28/2021   Lab Results  Component Value Date   VD25OH 71.4 04/09/2023   VD25OH 31.1 12/23/2022   VD25OH 36.9 07/17/2022   Lab Results  Component Value Date   WBC 9.4 02/13/2022   HGB 12.7 02/13/2022   HCT 37.3 02/13/2022   MCV 87.6 02/13/2022   PLT 338 02/13/2022   Lab Results  Component Value Date   IRON 61 07/27/2021   TIBC 280 07/27/2021   FERRITIN 24 07/27/2021   Attestation Statements:   Reviewed by clinician on day of visit: allergies, medications, problem list, medical history, surgical history, family history, social history, and previous encounter notes.  I have  reviewed the above documentation for accuracy and completeness, and I agree with the above. -  Milledge Gerding d. Gaby Harney, NP-C

## 2023-12-18 ENCOUNTER — Encounter (INDEPENDENT_AMBULATORY_CARE_PROVIDER_SITE_OTHER): Payer: Self-pay | Admitting: Adult Health

## 2023-12-18 ENCOUNTER — Ambulatory Visit (INDEPENDENT_AMBULATORY_CARE_PROVIDER_SITE_OTHER): Admitting: Adult Health

## 2023-12-18 VITALS — BP 112/73 | HR 81 | Temp 98.1°F | Ht 67.0 in | Wt 240.0 lb

## 2023-12-18 DIAGNOSIS — E88819 Insulin resistance, unspecified: Secondary | ICD-10-CM | POA: Diagnosis not present

## 2023-12-18 DIAGNOSIS — K760 Fatty (change of) liver, not elsewhere classified: Secondary | ICD-10-CM | POA: Diagnosis not present

## 2023-12-18 DIAGNOSIS — E7800 Pure hypercholesterolemia, unspecified: Secondary | ICD-10-CM | POA: Diagnosis not present

## 2023-12-18 DIAGNOSIS — E538 Deficiency of other specified B group vitamins: Secondary | ICD-10-CM

## 2023-12-18 DIAGNOSIS — Z6837 Body mass index (BMI) 37.0-37.9, adult: Secondary | ICD-10-CM | POA: Diagnosis not present

## 2023-12-18 DIAGNOSIS — E559 Vitamin D deficiency, unspecified: Secondary | ICD-10-CM | POA: Diagnosis not present

## 2023-12-18 MED ORDER — VITAMIN D (ERGOCALCIFEROL) 1.25 MG (50000 UNIT) PO CAPS: 50000.0000 [IU] | ORAL_CAPSULE | ORAL | 0 refills | Status: DC

## 2023-12-18 MED ORDER — CYANOCOBALAMIN 500 MCG PO TABS: 500.0000 ug | ORAL_TABLET | Freq: Every day | ORAL | 0 refills | Status: DC

## 2023-12-18 MED ORDER — METFORMIN HCL 500 MG PO TABS: ORAL_TABLET | ORAL | 0 refills | Status: DC

## 2023-12-18 NOTE — Progress Notes (Unsigned)
 WEIGHT SUMMARY AND BIOMETRICS  Vitals Temp: 98.1 F (36.7 C) BP: 112/73 Pulse Rate: 81 SpO2: 100 %   Anthropometric Measurements Height: 5' 7 (1.702 m) Weight: 240 lb (108.9 kg) BMI (Calculated): 37.58 Weight at Last Visit: 245 lb Weight Lost Since Last Visit: 5 lb Weight Gained Since Last Visit: 0 Starting Weight: 284 lb Total Weight Loss (lbs): 44 lb (20 kg)   Body Composition  Body Fat %: 48.4 % Fat Mass (lbs): 116.6 lbs Muscle Mass (lbs): 118 lbs Total Body Water  (lbs): 98 lbs Visceral Fat Rating : 11   Other Clinical Data Fasting: yes Labs: yes Today's Visit #: 26 Starting Date: 08/15/21    Chief Complaint:   OBESITY Janet [name] is here to discuss her progress with her obesity treatment plan.  She is on the the Category 3 Plan and states she is following her eating plan approximately 80 % of the time.  She states she is exercising Brisk Walking in evenings 30 minutes 5 times per week.   Interim History: *** Hunger/appetite-*** Cravings- *** Stress- *** Sleep- *** Exercise-If she cannot walk outside due to cold, she will utilize vibration plate for exercise Hydration-she estimates   Subjective:   1. Insulin  resistance HOMA-IR 3.759 [facility] NP is managing weekly Zepbound  5mg  Denies mass in neck, dysphagia, dyspepsia, persistent hoarseness, abdominal pain, or N/V/C   2. Metabolic dysfunction-associated steatotic liver disease (MASLD) ***  3. Vitamin D  deficiency ***  4. Vitamin B12 deficiency ***  5. Pure hypercholesterolemia She denies vape She smokes 3 cigarettes per day- weaning off!  Assessment/Plan:   1. Insulin  resistance HOMA-IR 3.759 (Primary) *** - metFORMIN  (GLUCOPHAGE ) 500 MG tablet; 1/2 tablet at lunch  Dispense: 30 tablet; Refill: 0 - Hemoglobin A1c - Insulin , random  2. Metabolic dysfunction-associated steatotic liver disease (MASLD) *** - Comprehensive metabolic panel with GFR  3. Vitamin D  deficiency *** -  Vitamin D , Ergocalciferol , (DRISDOL ) 1.25 MG (50000 UNIT) CAPS capsule; Take 1 capsule (50,000 Units total) by mouth every 7 (seven) days.  Dispense: 4 capsule; Refill: 0 - VITAMIN D  25 Hydroxy (Vit-D Deficiency, Fractures)  4. Vitamin B12 deficiency *** - cyanocobalamin  (VITAMIN B12) 500 MCG tablet; Take 1 tablet (500 mcg total) by mouth daily.  Dispense: 90 tablet; Refill: 0 - Vitamin B12  5. Pure hypercholesterolemia *** - Lipid panel  6. Morbid obesity (HCC), Current BMI 37.7 ***  Janet [name] {CHL AMB IS/IS NOT:[id number]} currently in the action stage of change. As such, her goal is to {MWMwtloss#1:210800005}. She has agreed to {MWMwtlossportion/plan2:23431}.   Exercise goals: {MWM EXERCISE RECS:23473}  Behavioral modification strategies: {MWMwtlossdietstrategies3:23432}.  Janet [name] has agreed to follow-up with our clinic in {NUMBER 1-10:22536} weeks. She was informed of the importance of frequent follow-up visits to maximize her success with intensive lifestyle modifications for her multiple health conditions.   ***delete paragraph if no labs orderedMegan was informed we would discuss her lab results at her next visit unless there is a critical issue that needs to be addressed sooner. Janet [name] agreed to keep her next visit at the agreed upon time to discuss these results.  Objective:   Blood pressure 112/73, pulse 81, temperature 98.1 F (36.7 C), height 5' 7 (1.702 m), weight 240 lb (108.9 kg), last menstrual period 11/13/2023, SpO2 100%. Body mass index is 37.59 kg/m.  General: Cooperative, alert, well developed, in no acute distress. HEENT: Conjunctivae and lids unremarkable. Cardiovascular: Regular rhythm.  Lungs: Normal work of breathing. Neurologic: No focal deficits.   Lab Results  Component Value Date   CREATININE 0.88 04/09/2023   BUN 14 04/09/2023   NA 139 04/09/2023   K 4.1 04/09/2023   CL 104 04/09/2023   CO2 19 (L) 04/09/2023   Lab Results  Component Value Date    ALT 9 08/13/2023   AST 7 08/13/2023   ALKPHOS 74 08/13/2023   BILITOT 0.3 08/13/2023   Lab Results  Component Value Date   HGBA1C 5.3 04/09/2023   HGBA1C 5.5 12/23/2022   HGBA1C 5.2 07/17/2022   HGBA1C 5.5 03/18/2022   HGBA1C 5.5 08/15/2021   Lab Results  Component Value Date   INSULIN  17.5 04/09/2023   INSULIN  15.1 12/23/2022   INSULIN  22.8 07/17/2022   INSULIN  13.8 08/15/2021   Lab Results  Component Value Date   TSH 0.983 12/04/2022   Lab Results  Component Value Date   CHOL 184 08/15/2021   HDL 36 (L) 08/15/2021   LDLCALC 122 (H) 08/15/2021   TRIG 147 08/15/2021   CHOLHDL 6.6 (H) 02/28/2021   Lab Results  Component Value Date   VD25OH 71.4 04/09/2023   VD25OH 31.1 12/23/2022   VD25OH 36.9 07/17/2022   Lab Results  Component Value Date   WBC 9.4 02/13/2022   HGB 12.7 02/13/2022   HCT 37.3 02/13/2022   MCV 87.6 02/13/2022   PLT 338 02/13/2022   Lab Results  Component Value Date   IRON 61 07/27/2021   TIBC 280 07/27/2021   FERRITIN 24 07/27/2021    Obesity Behavioral Intervention:   Approximately 15 minutes were spent on the discussion below.  ASK: We discussed the diagnosis of obesity with Janet [name] today and Janet [name] agreed to give us  permission to discuss obesity behavioral modification therapy today.  ASSESS: Janet [name] has the diagnosis of obesity and her BMI today is ***. Janet [name] {ACTION; IS/IS [id number] in the action stage of change.   ADVISE: Toneisha was educated on the multiple health risks of obesity as well as the benefit of weight loss to improve her health. She was advised of the need for long term treatment and the importance of lifestyle modifications to improve her current health and to decrease her risk of future health problems.  AGREE: Multiple dietary modification options and treatment options were discussed and Janet [name] agreed to follow the recommendations documented in the above note.  ARRANGE: Janet [name] was educated on the importance of  frequent visits to treat obesity as outlined per CMS and USPSTF guidelines and agreed to schedule her next follow up appointment today.  Attestation Statements:   Reviewed by clinician on day of visit: allergies, medications, problem list, medical history, surgical history, family history, social history, and previous encounter notes.  ***(delete if time-based billing not used)Time spent on visit including pre-visit chart review and post-visit care and charting was *** minutes.   I, ***, am acting as transcriptionist for ***.  I have reviewed the above documentation for accuracy and completeness, and I agree with the above. -  ***

## 2023-12-19 LAB — LIPID PANEL
Chol/HDL Ratio: 4.9 ratio — ABNORMAL HIGH (ref 0.0–4.4)
Cholesterol, Total: 201 mg/dL — ABNORMAL HIGH (ref 100–199)
HDL: 41 mg/dL (ref >39)
LDL Chol Calc (NIH): 136 mg/dL — ABNORMAL HIGH (ref 0–99)
Triglycerides: 133 mg/dL (ref 0–149)
VLDL Cholesterol Cal: 24 mg/dL (ref 5–40)

## 2023-12-19 LAB — COMPREHENSIVE METABOLIC PANEL WITH GFR
ALT: 15 IU/L (ref 0–32)
AST: 8 IU/L (ref 0–40)
Albumin: 4.4 g/dL (ref 4.0–5.0)
Alkaline Phosphatase: 80 IU/L (ref 41–116)
BUN/Creatinine Ratio: 19 (ref 9–23)
BUN: 16 mg/dL (ref 6–20)
Bilirubin Total: 0.6 mg/dL (ref 0.0–1.2)
CO2: 22 mmol/L (ref 20–29)
Calcium: 8.9 mg/dL (ref 8.7–10.2)
Chloride: 101 mmol/L (ref 96–106)
Creatinine, Ser: 0.86 mg/dL (ref 0.57–1.00)
Globulin, Total: 2.5 g/dL (ref 1.5–4.5)
Glucose: 71 mg/dL (ref 70–99)
Potassium: 3.8 mmol/L (ref 3.5–5.2)
Sodium: 139 mmol/L (ref 134–144)
Total Protein: 6.9 g/dL (ref 6.0–8.5)
eGFR: 94 mL/min/1.73 (ref >59)

## 2023-12-19 LAB — VITAMIN D 25 HYDROXY (VIT D DEFICIENCY, FRACTURES): Vit D, 25-Hydroxy: 68.4 ng/mL (ref 30.0–100.0)

## 2023-12-19 LAB — VITAMIN B12: Vitamin B-12: 315 pg/mL (ref 232–1245)

## 2023-12-19 LAB — HEMOGLOBIN A1C
Est. average glucose Bld gHb Est-mCnc: 97 mg/dL
Hgb A1c MFr Bld: 5 % (ref 4.8–5.6)

## 2023-12-19 LAB — INSULIN, RANDOM: INSULIN: 15.4 u[IU]/mL (ref 2.6–24.9)

## 2024-01-13 ENCOUNTER — Other Ambulatory Visit: Payer: Self-pay | Admitting: Family Medicine

## 2024-01-13 DIAGNOSIS — R600 Localized edema: Secondary | ICD-10-CM

## 2024-01-13 DIAGNOSIS — F419 Anxiety disorder, unspecified: Secondary | ICD-10-CM

## 2024-01-21 ENCOUNTER — Encounter (INDEPENDENT_AMBULATORY_CARE_PROVIDER_SITE_OTHER): Payer: Self-pay | Admitting: Adult Health

## 2024-01-21 ENCOUNTER — Ambulatory Visit (INDEPENDENT_AMBULATORY_CARE_PROVIDER_SITE_OTHER): Admitting: Adult Health

## 2024-01-21 VITALS — BP 122/83 | HR 91 | Temp 98.1°F | Ht 67.0 in | Wt 242.0 lb

## 2024-01-21 DIAGNOSIS — Z6838 Body mass index (BMI) 38.0-38.9, adult: Secondary | ICD-10-CM

## 2024-01-21 DIAGNOSIS — E559 Vitamin D deficiency, unspecified: Secondary | ICD-10-CM

## 2024-01-21 DIAGNOSIS — E538 Deficiency of other specified B group vitamins: Secondary | ICD-10-CM | POA: Diagnosis not present

## 2024-01-21 DIAGNOSIS — E7800 Pure hypercholesterolemia, unspecified: Secondary | ICD-10-CM

## 2024-01-21 DIAGNOSIS — K760 Fatty (change of) liver, not elsewhere classified: Secondary | ICD-10-CM | POA: Diagnosis not present

## 2024-01-21 DIAGNOSIS — E88819 Insulin resistance, unspecified: Secondary | ICD-10-CM | POA: Diagnosis not present

## 2024-01-21 MED ORDER — CYANOCOBALAMIN 500 MCG PO TABS: 500.0000 ug | ORAL_TABLET | Freq: Every day | ORAL | 0 refills | Status: AC

## 2024-01-21 MED ORDER — METFORMIN HCL 500 MG PO TABS: ORAL_TABLET | ORAL | 0 refills | Status: AC

## 2024-01-21 MED ORDER — VITAMIN D (ERGOCALCIFEROL) 1.25 MG (50000 UNIT) PO CAPS: 50000.0000 [IU] | ORAL_CAPSULE | ORAL | 0 refills | Status: AC

## 2024-01-21 NOTE — Progress Notes (Signed)
 "    WEIGHT SUMMARY AND BIOMETRICS  Vitals Temp: 98.1 F (36.7 C) BP: 122/83 Pulse Rate: 91 SpO2: 100 %   Anthropometric Measurements Height: 5' 7 (1.702 m) Weight: 242 lb (109.8 kg) BMI (Calculated): 37.89 Weight at Last Visit: 240lb Weight Lost Since Last Visit: 0lb Weight Gained Since Last Visit: 2lb Starting Weight: 284lb Total Weight Loss (lbs): 42 lb (19.1 kg)   Body Composition  Body Fat %: 49.4 % Fat Mass (lbs): 120 lbs Muscle Mass (lbs): 116.6 lbs Visceral Fat Rating : 11   Other Clinical Data Fasting: no Labs: no Today's Visit #: 27 Starting Date: 08/15/21    Chief Complaint:   OBESITY Janet [name] is here to discuss her progress with her obesity treatment plan.  She is on the the Category 3 Plan and states she is following her eating plan approximately 80 % of the time.  She states she is exercising walking 30 minutes 5 times per week.  Interim History:  She was off Zepbound  5mg  for two weeks, then restarted Zepbound  7.5mg  on 01/16/2024 Denies mass in neck, dysphagia, dyspepsia, persistent hoarseness, abdominal pain, or N/V/C [facility] Provider managing Zepbound  therapy  Current weight 242 lbs Goal weight 170-180 lbs  She continues to enjoy her new work schedule She has continued to increase daily walking!  Subjective:   1. Insulin  resistance HOMA-IR 3.759 Discussed Labs  Latest Reference Range & Units 12/18/23 15:34  Glucose 70 - 99 mg/dL 71  Hemoglobin J8R 4.8 - 5.6 % 5.0  Est. average glucose Bld gHb Est-mCnc mg/dL 97  INSULIN  2.6 - 24.9 uIU/mL 15.4   CBG, A1c at goal Insulin  level above goal of 5 She is on Metformin  500mg  1/2 tab at lunch She is on weekly Zepbound  7.5mg  Denies mass in neck, dysphagia, dyspepsia, persistent hoarseness, abdominal pain, or N/V/C   2. Vitamin B12 deficiency Discussed Labs  Latest Reference Range & Units 12/18/23 15:34  Vitamin B12 232 - 1,245 pg/mL 315   B12 level below goal of 500 She has not been  taking daily B12 500 mcg  3. Metabolic dysfunction-associated steatotic liver disease (MASLD) Discussed Labs  Latest Reference Range & Units 12/18/23 15:34  Alkaline Phosphatase 41 - 116 IU/L 80  Albumin 4.0 - 5.0 g/dL 4.4  AST 0 - 40 IU/L 8  ALT 0 - 32 IU/L 15   She denies RUQ Liver Enzymes have normalized  4. Vitamin D  deficiency Discussed Labs  Latest Reference Range & Units 12/18/23 15:34  Vitamin D , 25-Hydroxy 30.0 - 100.0 ng/mL 68.4   Vit D Level at goal She is on weekly Ergocalciferol   5. Pure hypercholesterolemia Discussed Labs Lipid Panel     Component Value Date/Time   CHOL 201 (H) 12/18/2023 1534   TRIG 133 12/18/2023 1534   HDL 41 12/18/2023 1534   CHOLHDL 4.9 (H) 12/18/2023 1534   LDLCALC 136 (H) 12/18/2023 1534   LABVLDL 24 12/18/2023 1534    Tot and LDL both worsened She denies tobacco/vape use She denies CP with exertion  Assessment/Plan:   1. Insulin  resistance HOMA-IR 3.759 (Primary) Refill - metFORMIN  (GLUCOPHAGE ) 500 MG tablet; 1/2 tablet at lunch  Dispense: 30 tablet; Refill: 0  2. Vitamin B12 deficiency Restart - cyanocobalamin  (VITAMIN B12) 500 MCG tablet; Take 1 tablet (500 mcg total) by mouth daily.  Dispense: 90 tablet; Refill: 0  3. Metabolic dysfunction-associated steatotic liver disease (MASLD) Continue with weight loss efforts  4. Vitamin D  deficiency Refill - Vitamin D , Ergocalciferol , (DRISDOL ) 1.25  MG (50000 UNIT) CAPS capsule; Take 1 capsule (50,000 Units total) by mouth every 7 (seven) days.  Dispense: 4 capsule; Refill: 0  5. Pure hypercholesterolemia Reduce saturated fat and increase regular walking  6. Morbid obesity (HCC), Current BMI 38.0 Continue with [facility] provider and weekly Zepbound  7.5mg  Patient was counseled on the importance of maintaining healthy lifestyle habits, including balanced nutrition, regular physical activity, and behavioral modifications, while taking antiobesity medication.   Patient verbalized  understanding that medication is an adjunct to, not a replacement for, lifestyle changes and that the long-term success and weight maintenance depend on continued adherence to these strategies.   Janet [name] is currently in the action stage of change. As such, her goal is to continue with weight loss efforts. She has agreed to the Category 3 Plan.   Exercise goals: For substantial health benefits, adults should do at least 150 minutes (2 hours and 30 minutes) a week of moderate-intensity, or 75 minutes (1 hour and 15 minutes) a week of vigorous-intensity aerobic physical activity, or an equivalent combination of moderate- and vigorous-intensity aerobic activity. Aerobic activity should be performed in episodes of at least 10 minutes, and preferably, it should be spread throughout the week.  Behavioral modification strategies: increasing lean protein intake, decreasing simple carbohydrates, increasing vegetables, increasing water  intake, no skipping meals, meal planning and cooking strategies, keeping healthy foods in the home, ways to avoid boredom eating, and planning for success.  Janet [name] has agreed to follow-up with our clinic in 4 weeks. She was informed of the importance of frequent follow-up visits to maximize her success with intensive lifestyle modifications for her multiple health conditions.   Objective:   Blood pressure 122/83, pulse 91, temperature 98.1 F (36.7 C), height 5' 7 (1.702 m), weight 242 lb (109.8 kg), SpO2 100%. Body mass index is 37.9 kg/m.  General: Cooperative, alert, well developed, in no acute distress. HEENT: Conjunctivae and lids unremarkable. Cardiovascular: Regular rhythm.  Lungs: Normal work of breathing. Neurologic: No focal deficits.   Lab Results  Component Value Date   CREATININE 0.86 12/18/2023   BUN 16 12/18/2023   NA 139 12/18/2023   K 3.8 12/18/2023   CL 101 12/18/2023   CO2 22 12/18/2023   Lab Results  Component Value Date   ALT 15 12/18/2023    AST 8 12/18/2023   ALKPHOS 80 12/18/2023   BILITOT 0.6 12/18/2023   Lab Results  Component Value Date   HGBA1C 5.0 12/18/2023   HGBA1C 5.3 04/09/2023   HGBA1C 5.5 12/23/2022   HGBA1C 5.2 07/17/2022   HGBA1C 5.5 03/18/2022   Lab Results  Component Value Date   INSULIN  15.4 12/18/2023   INSULIN  17.5 04/09/2023   INSULIN  15.1 12/23/2022   INSULIN  22.8 07/17/2022   INSULIN  13.8 08/15/2021   Lab Results  Component Value Date   TSH 0.983 12/04/2022   Lab Results  Component Value Date   CHOL 201 (H) 12/18/2023   HDL 41 12/18/2023   LDLCALC 136 (H) 12/18/2023   TRIG 133 12/18/2023   CHOLHDL 4.9 (H) 12/18/2023   Lab Results  Component Value Date   VD25OH 68.4 12/18/2023   VD25OH 71.4 04/09/2023   VD25OH 31.1 12/23/2022   Lab Results  Component Value Date   WBC 9.4 02/13/2022   HGB 12.7 02/13/2022   HCT 37.3 02/13/2022   MCV 87.6 02/13/2022   PLT 338 02/13/2022   Lab Results  Component Value Date   IRON 61 07/27/2021   TIBC 280 07/27/2021  FERRITIN 24 07/27/2021   Attestation Statements:   Reviewed by clinician on day of visit: allergies, medications, problem list, medical history, surgical history, family history, social history, and previous encounter notes.  I have reviewed the above documentation for accuracy and completeness, and I agree with the above. -  Rashun Grattan d. Datrell Dunton, NP-C "

## 2024-02-25 ENCOUNTER — Ambulatory Visit (INDEPENDENT_AMBULATORY_CARE_PROVIDER_SITE_OTHER): Admitting: Adult Health

## 2024-04-27 ENCOUNTER — Encounter: Payer: Self-pay | Admitting: Family Medicine
